# Patient Record
Sex: Male | Born: 1970 | Race: White | Hispanic: No | Marital: Single | Smoking: Never smoker
Health system: Southern US, Community
[De-identification: ages and names within clinical notes are randomized; demographics above are authoritative.]

## PROBLEM LIST (undated history)

## (undated) DIAGNOSIS — I10 Essential (primary) hypertension: Secondary | ICD-10-CM

## (undated) DIAGNOSIS — G629 Polyneuropathy, unspecified: Secondary | ICD-10-CM

## (undated) DIAGNOSIS — E7800 Pure hypercholesterolemia, unspecified: Secondary | ICD-10-CM

## (undated) DIAGNOSIS — K219 Gastro-esophageal reflux disease without esophagitis: Secondary | ICD-10-CM

## (undated) DIAGNOSIS — E119 Type 2 diabetes mellitus without complications: Secondary | ICD-10-CM

## (undated) DIAGNOSIS — L509 Urticaria, unspecified: Secondary | ICD-10-CM

## (undated) DIAGNOSIS — T7840XA Allergy, unspecified, initial encounter: Secondary | ICD-10-CM

## (undated) HISTORY — DX: Polyneuropathy, unspecified: G62.9

## (undated) HISTORY — DX: Type 2 diabetes mellitus without complications: E11.9

## (undated) HISTORY — PX: CATARACT EXTRACTION, BILATERAL: SHX1313

## (undated) HISTORY — DX: Allergy, unspecified, initial encounter: T78.40XA

## (undated) HISTORY — PX: VASECTOMY: SHX75

## (undated) HISTORY — PX: OTHER SURGICAL HISTORY: SHX169

---

## 1998-10-26 ENCOUNTER — Emergency Department (HOSPITAL_COMMUNITY): Admission: EM | Admit: 1998-10-26 | Discharge: 1998-10-26 | Payer: Self-pay | Admitting: Emergency Medicine

## 1998-10-26 ENCOUNTER — Encounter: Payer: Self-pay | Admitting: Emergency Medicine

## 2001-11-14 ENCOUNTER — Emergency Department (HOSPITAL_COMMUNITY): Admission: EM | Admit: 2001-11-14 | Discharge: 2001-11-15 | Payer: Self-pay | Admitting: Emergency Medicine

## 2003-06-20 ENCOUNTER — Emergency Department (HOSPITAL_COMMUNITY): Admission: EM | Admit: 2003-06-20 | Discharge: 2003-06-20 | Payer: Self-pay | Admitting: Emergency Medicine

## 2013-11-11 ENCOUNTER — Encounter (HOSPITAL_COMMUNITY): Payer: Self-pay | Admitting: Emergency Medicine

## 2013-11-11 ENCOUNTER — Emergency Department (HOSPITAL_COMMUNITY)
Admission: EM | Admit: 2013-11-11 | Discharge: 2013-11-11 | Disposition: A | Discharge status: Home / Self Care | Payer: 59 | Attending: Emergency Medicine | Admitting: Emergency Medicine

## 2013-11-11 ENCOUNTER — Emergency Department (HOSPITAL_COMMUNITY): Payer: 59

## 2013-11-11 DIAGNOSIS — R0789 Other chest pain: Secondary | ICD-10-CM | POA: Insufficient documentation

## 2013-11-11 DIAGNOSIS — R0602 Shortness of breath: Secondary | ICD-10-CM | POA: Insufficient documentation

## 2013-11-11 DIAGNOSIS — E7800 Pure hypercholesterolemia, unspecified: Secondary | ICD-10-CM | POA: Insufficient documentation

## 2013-11-11 DIAGNOSIS — R110 Nausea: Secondary | ICD-10-CM | POA: Insufficient documentation

## 2013-11-11 DIAGNOSIS — Z880 Allergy status to penicillin: Secondary | ICD-10-CM | POA: Insufficient documentation

## 2013-11-11 DIAGNOSIS — Z872 Personal history of diseases of the skin and subcutaneous tissue: Secondary | ICD-10-CM | POA: Insufficient documentation

## 2013-11-11 DIAGNOSIS — R42 Dizziness and giddiness: Secondary | ICD-10-CM | POA: Insufficient documentation

## 2013-11-11 DIAGNOSIS — R5383 Other fatigue: Secondary | ICD-10-CM

## 2013-11-11 DIAGNOSIS — Z79899 Other long term (current) drug therapy: Secondary | ICD-10-CM | POA: Insufficient documentation

## 2013-11-11 DIAGNOSIS — I10 Essential (primary) hypertension: Secondary | ICD-10-CM | POA: Insufficient documentation

## 2013-11-11 DIAGNOSIS — R5381 Other malaise: Secondary | ICD-10-CM | POA: Insufficient documentation

## 2013-11-11 DIAGNOSIS — R079 Chest pain, unspecified: Secondary | ICD-10-CM

## 2013-11-11 DIAGNOSIS — E119 Type 2 diabetes mellitus without complications: Secondary | ICD-10-CM | POA: Insufficient documentation

## 2013-11-11 HISTORY — DX: Pure hypercholesterolemia, unspecified: E78.00

## 2013-11-11 HISTORY — DX: Type 2 diabetes mellitus without complications: E11.9

## 2013-11-11 HISTORY — DX: Essential (primary) hypertension: I10

## 2013-11-11 HISTORY — DX: Urticaria, unspecified: L50.9

## 2013-11-11 LAB — CBC
HEMATOCRIT: 42.6 % (ref 39.0–52.0)
Hemoglobin: 14.7 g/dL (ref 13.0–17.0)
MCH: 29.9 pg (ref 26.0–34.0)
MCHC: 34.5 g/dL (ref 30.0–36.0)
MCV: 86.8 fL (ref 78.0–100.0)
Platelets: 193 10*3/uL (ref 150–400)
RBC: 4.91 MIL/uL (ref 4.22–5.81)
RDW: 12.3 % (ref 11.5–15.5)
WBC: 5.8 10*3/uL (ref 4.0–10.5)

## 2013-11-11 LAB — BASIC METABOLIC PANEL
BUN: 10 mg/dL (ref 6–23)
CO2: 22 mEq/L (ref 19–32)
Calcium: 9.2 mg/dL (ref 8.4–10.5)
Chloride: 98 mEq/L (ref 96–112)
Creatinine, Ser: 0.66 mg/dL (ref 0.50–1.35)
GFR calc Af Amer: >90 mL/min (ref >90)
GLUCOSE: 243 mg/dL — AB (ref 70–99)
Potassium: 4.2 mEq/L (ref 3.7–5.3)
Sodium: 134 mEq/L — ABNORMAL LOW (ref 137–147)

## 2013-11-11 LAB — I-STAT TROPONIN, ED
Troponin i, poc: 0 ng/mL (ref 0.00–0.08)
Troponin i, poc: 0 ng/mL (ref 0.00–0.08)

## 2013-11-11 LAB — D-DIMER, QUANTITATIVE: D-Dimer, Quant: <0.27 ug/mL-FEU (ref 0.00–0.48)

## 2013-11-11 NOTE — Discharge Instructions (Signed)
Please follow up with [facility] in the next week for further evaluation of your chest pain.  You may benefit from a stress test and further work up.  Return if your chest pain worsen or if you have other concerns.    Chest Pain Observation It is often hard to give a specific diagnosis for the cause of chest pain. Among other possibilities your symptoms might be caused by inadequate oxygen delivery to your heart (angina). Angina that is not treated or evaluated can lead to a heart attack (myocardial infarction) or death. Blood tests, electrocardiograms, and X-rays may have been done to help determine a possible cause of your chest pain. After evaluation and observation, your health care provider has determined that it is unlikely your pain was caused by an unstable condition that requires hospitalization. However, a full evaluation of your pain may need to be completed, with additional diagnostic testing as directed. It is very important to keep your follow-up appointments. Not keeping your follow-up appointments could result in permanent heart damage, disability, or death. If there is any problem keeping your follow-up appointments, you must call your health care provider. HOME CARE INSTRUCTIONS  Due to the slight chance that your pain could be angina, it is important to follow your health care provider's treatment plan and also maintain a healthy lifestyle:  Maintain or work toward achieving a healthy weight.  Stay physically active and exercise regularly.  Decrease your salt intake.  Eat a balanced, healthy diet. Talk to a dietician to learn about heart healthy foods.  Increase your fiber intake by including whole grains, vegetables, fruits, and nuts in your diet.  Avoid situations that cause stress, anger, or depression.  Take medicines as advised by your health care provider. Report any side effects to your health care provider. Do not stop medicines or adjust the dosages on your  own.  Quit smoking. Do not use nicotine patches or gum until you check with your health care provider.  Keep your blood pressure, blood sugar, and cholesterol levels within normal limits.  Limit alcohol intake to no more than 1 drink per day for women that are not pregnant and 2 drinks per day for men.  Do not abuse drugs. SEEK IMMEDIATE MEDICAL CARE IF: You have severe chest pain or pressure which may include symptoms such as:  You feel pain or pressure in you arms, neck, jaw, or back.  You have severe back or abdominal pain, feel sick to your stomach (nauseous), or throw up (vomit).  You are sweating profusely.  You are having a fast or irregular heartbeat.  You feel short of breath while at rest.  You notice increasing shortness of breath during rest, sleep, or with activity.  You have chest pain that does not get better after rest or after taking your usual medicine.  You wake from sleep with chest pain.  You are unable to sleep because you cannot breathe.  You develop a frequent cough or you are coughing up blood.  You feel dizzy, faint, or experience extreme fatigue.  You develop severe weakness, dizziness, fainting, or chills. Any of these symptoms may represent a serious problem that is an emergency. Do not wait to see if the symptoms will go away. Call your local emergency services (911 in the U.S.). Do not drive yourself to the hospital. MAKE SURE YOU:  Understand these instructions.  Will watch your condition.  Will get help right away if you are not doing well or get  worse. Document Released: 09/22/2010 Document Revised: 04/22/2013 Document Reviewed: 02/19/2013 [facility] Patient Information 2014 [place], [place].   Emergency Department Resource Guide 1) Find a Doctor and Pay Out of Pocket Although you won't have to find out who is covered by your insurance plan, it is a good idea to ask around and get recommendations. You will then need to call the office  and see if the doctor you have chosen will accept you as a new patient and what types of options they offer for patients who are self-pay. Some doctors offer discounts or will set up payment plans for their patients who do not have insurance, but you will need to ask so you aren't surprised when you get to your appointment.  2) Contact Your Local Health Department Not all health departments have doctors that can see patients for sick visits, but many do, so it is worth a call to see if yours does. If you don't know where your local health department is, you can check in your phone book. The CDC also has a tool to help you locate your state's health department, and many state websites also have listings of all of their local health departments.  3) Find a Walk-in Clinic If your illness is not likely to be very severe or complicated, you may want to try a walk in clinic. These are popping up all over the country in pharmacies, drugstores, and shopping centers. They're usually staffed by nurse practitioners or physician assistants that have been trained to treat common illnesses and complaints. They're usually fairly quick and inexpensive. However, if you have serious medical issues or chronic medical problems, these are probably not your best option.  No Primary Care Doctor: - Call Health Connect at  [phone] - they can help you locate a primary care doctor that  accepts your insurance, provides certain services, etc. - Physician Referral Service- [phone]  Chronic Pain Problems: Organization         Address  Phone   Notes  [facility]  [phone] Patients need to be referred by their primary care doctor.   Medication Assistance: Organization         Address  Phone   Notes  [facility] Medication [facility] [address] [place] [place], [place] [address] [phone] --Must be a resident of [facility] -- Must have NO insurance coverage  whatsoever (no Medicaid/ Medicare, etc.) -- The pt. MUST have a primary care doctor that directs their care regularly and follows them in the community   [facility]ist  [phone]   [employer]  [phone]    Agencies that provide inexpensive medical care: Organization         Address  Phone   Notes  [facility]  [phone]   [facility]    [phone]   [facility] [address] [place], [place] [address] [phone]   [facility] [address] [place], [place] [phone]   Planned Parenthood    [phone]   [facility]    [phone]   [facility]  [address], [place] Phone:  [phone], Fax:  [phone] Hours of Operation:  9 am - 6 pm, M-F.  Also accepts Medicaid/Medicare and self-pay.  [facility]  [address], [place] Phone: [phone], Fax: [phone]. Hours of  8:30 am - 5:30 pm, M-F.  Also accepts Medicaid and self-pay.  °[facility] [place] [address], [place] Phone: ([phone]) [phone]   °[facility] [address], [place], [place] [phone], Ext. 123 Mondays & Thursdays: 7-9 AM.  First 15 patients are seen on a first come, first serve basis. °  ° °Medicaid-accepting [place] Providers: ° °Organization         Address  Phone   Notes  °[facility] [address], [place] [phone] Also accepts self-pay patients.  °[facility] [address], [place] ° ([phone]) [phone]   °[facility] [address], [place] [phone]) [phone]   °[facility] [address] [place] [address], [place] [phone]) [phone]   °Veita Bland [address], [place]  ° ([phone]) [phone] Only accepts [place] Access Medicaid patients after they have their name applied  to their card.  ° °Self-Pay (no insurance) in [place]: ° °Organization         Address  Phone   Notes  °Sickle Cell Patients, [facility] [address], [place] [phone]   °[facility] [address], [place] [phone]) [phone]   °[facility] ° [address], [place] [phone]) [phone]   °[facility]/Dr. Osei-Bonsu ° [address] [place] [address], [place] or [address], [place] [phone] Phone number for both [place] and [place] locations is the same.  °[facility] [address], [place] ([phone]) [phone]   °[facility] [address] [place] [address], [place] or [address] ([phone]) [phone] °([phone]) [phone]   °[facility] [address], [place] ([phone]) [phone], phone; ([phone]) [phone], fax Sees patients 1st and 3rd Saturday of every month.  Must not qualify for public or private insurance (i.e. Medicaid, Medicare, [place] Health Choice, Veterans' Benefits) • Household income should be no more than 200% of the poverty level •The clinic cannot treat you if you are pregnant or think you are pregnant • Sexually transmitted diseases are not treated at the clinic.  ° ° °Dental Care: °Organization         Address  Phone  Notes  °[place] [facility] [address], [place] ([phone]) [phone] Accepts children up to age 21 who are enrolled in Medicaid or [place] Health Choice; pregnant women with a Medicaid card; and children who have applied for Medicaid or [place] Health Choice, but were declined, whose parents can pay a reduced fee at time of service.  °[place] [facility] [place]  [address], [place] [phone]) [phone] Accepts children up to age 21 who are enrolled in Medicaid or [place] Health Choice; pregnant women with a Medicaid card; and children who have applied for Medicaid or   Health Choice, but were declined, whose parents can pay a reduced fee at time of service.  °[facility] ° [address], [place] ([phone]) [phone] Patients are seen by appointment only. Walk-ins are not accepted. [facility] will see patients 18 years of age and older. °Monday - Tuesday (8am-5pm) °Most Wednesdays (8:30-5pm) °$30 per visit, cash only  °[facility] ° [address], [place] [phone]) [phone] Patients are seen by appointment   by appointment only. Walk-ins are not accepted. [facility] will see patients 53 years of age and older. One Wednesday Evening (Monthly: Volunteer Based).  $30 per visit, cash only  [employer]  [phone] for adults; Children under age 66, call Graduate Pediatric Dentistry at [phone]. Children aged 49-14, please call [phone] to request a pediatric application.  Dental services are provided in all areas of dental care including fillings, crowns and bridges, complete and partial dentures, implants, gum treatment, root canals, and extractions. Preventive care is also provided. Treatment is provided to both adults and children. Patients are selected via a lottery and there is often a waiting list.   [facility] [address] [place]  [phone] [email or web address]   [facility] [address] [place], [place] [phone], Ext. [phone] Second and Fourth Thursday of each month, opens at 6:30 AM; Clinic ends at 9 AM.  Patients are seen on a first-come first-served basis, and a limited number are seen during each clinic.   [facility]  [address] [place], [place] [phone]   Eligibility Requirements You must have lived in [place], [place], or [place] counties for at least the last three months.   You cannot be eligible for state or federal sponsored [place], including [employer], [place], or [employer].    You generally cannot be eligible for healthcare insurance through your employer.    How to apply: Eligibility screenings are held every Tuesday and Wednesday afternoon from 1:00 pm until 4:00 pm. You do not need an appointment for the interview!  [facility] [address], [place], [place] [phone]   [facility] Health Department  [phone]   [facility] Health Department  [phone]   [facility] Health Department  [phone]    Behavioral Health Resources in the Community: Intensive Outpatient Programs Organization         Address  Phone  Notes  [facility] [address], [place], [place] [phone]   [facility] Outpatient [address] [place], [place] [phone]   [facility] [address] [place], [place]  [phone]   [facility] [address]  [place], [place] [phone] or [phone]   Substance Abuse Resources Organization         Address  Phone  Notes  [facility]  [phone]   [facility]  [phone]   [facility]  [phone]   Floydene Flock  [phone]   Residential & Outpatient Substance Abuse Program  [phone]   Psychological Services Organization         Address  Phone  Notes  [facility] Behavioral Health  [phone]   [facility] Services  [phone]   [facility] [address], [place] [phone] or [phone]    Mobile Crisis Teams Organization         Address  Phone  Notes  [facility], Mobile Crisis Care Unit  7264258222   [facility]  [address] [place], [place] [phone]   Doristine Locks [address] [place] [phone]    Self-Help/Support Groups Organization         Address  Phone             Notes  [facility] [place] - variety of support groups  336- I7437963 Call  for more  information  Narcotics Anonymous (NA), [facility] [address], [employer] [place]  2 meetings at this location   Residential Sports administrator         Address  Phone  Notes  [facility] [address],    [place]  [phone]   [facility]  [address], [place] [address], [place], [place] [phone]   [facility] [address] [place], [place] [phone] Admissions: 8am-3pm M-F  [facility] [address],    [place], [place] [phone]   [facility] [address] [place], [place], [place] [phone]   [facility] [address]  [place], [place] [phone]   [facility] - Intensive Outpatient [address], [place], [place] [phone]   [facility] ([facility] [address]  [place], [place] [phone] or [phone]   [facility] ([facility]) [address], [place], [place] [phone] Accepts Medicaid  [facility] [place] [address]  [place] [phone] Substance Abuse/Addiction Treatment   [facility] Organization         Address  Phone  Notes  [facility]  [phone]   Angie Fava, PhD [address] [place], [place]   [phone] or [phone]   [facility]al   [address] [place], [place] [phone]   [facility] [address], [place], [place] [phone] Insurance/Medicaid/sponsorship through [facility] and Families [address]                                    [place], [place] [phone] Therapy/tele-psych/case  [facility] [address] [place], [place] [phone]    Dr. Lolly Mustache  [phone]   [facility]  [employer] [facility] Dept. 1) [address], [place] 2) [address] [place] 3)  [address], [place]  [phone]  [phone]   [facility] Child Abuse Hotline 717-566-3315 or [phone] (After Hours)

## 2013-11-11 NOTE — ED Provider Notes (Signed)
Medical screening examination/treatment/procedure(s) were conducted as a shared visit with non-physician practitioner(s) and myself.  I personally evaluated the patient during the encounter.   EKG Interpretation   Date/Time:  Wednesday November 11 2013 12:36:02 EDT Ventricular Rate:  78 PR Interval:  189 QRS Duration: 91 QT Interval:  359 QTC Calculation: 409 R Axis:   -6 Text Interpretation:  Sinus rhythm Anterior infarct, old Baseline wander  in lead(s) V1 No old tracing to compare Confirmed by [facility]  MD, TREY  ([id number]) on 11/11/2013 3:13:35 PM      43 yo male with chest pain.  Somewhat atypical story.  None since 9:30am today.  On exam, well appearing, nontoxic, not diaphoretic, lungs clear to auscultation bilaterally, heart sounds normal, regular rate and rhythm. Initial workup reassuring. Plan delta troponin and close cardiology followup if negative.  Clinical Impression: 1. Chest pain       Candyce ChurnJohn David Aily Tzeng III, MD 11/11/13 [phone]

## 2013-11-11 NOTE — ED Provider Notes (Signed)
CSN: [id number]     Arrival date & time 11/11/13  1230 History   First MD Initiated Contact with Patient 11/11/13 1258     Chief Complaint  Patient presents with  . Chest Pain  . Nausea  . Fatigue     (Consider location/radiation/quality/duration/timing/severity/associated sxs/prior Treatment) HPI  43 year old male with history of non-insulin-dependent diabetes, hypertension, high cholesterol who [facility] of [facility] pain. Patient has been complaining of intermittent chest pain for the past 4 days. Pain is associated with dizziness, shortness of breath, lightheadedness, nausea and weakness. Pain is described as a sharp and pressure sensation to his left upper chest, nonradiating, lasting for about 20-30 minutes. Denies any precipitating symptoms. This morning after getting out of the shower he experienced the same chest discomfort which prompted her to go to urgent care for further evaluation. He did receive aspirin at the urgent care. He denies any active chest pain or shortness of breath at this time. He denies fever, chills, productive cough, hemoptysis, dyspnea on exertion, back or abdominal pain, or numbness. No prior cardiac provocative testing. Patient is a nonsmoker. Patient report recent [place] ([place] hrs trip).  This was 5 days ago prior to the onset of his symptoms. However he denies any prior history of PE or DVT, no recent surgery, prolonged bed rest,   Past Medical History  Diagnosis Date  . Diabetes mellitus without complication   . Hypertension   . Hives of unknown origin   . High cholesterol    History reviewed. No pertinent past surgical history. No family history on file. History  Substance Use Topics  . Smoking status: Never Smoker   . Smokeless tobacco: Not on file  . Alcohol Use: No    Review of Systems  All other systems reviewed and are negative.      Allergies  Penicillins  Home Medications   Current  Outpatient Rx  Name  Route  Sig  Dispense  Refill  . atorvastatin (LIPITOR) 20 MG tablet   Oral   Take 20 mg by mouth daily.         Mar[place] Kitchen. glimepiride (AMARYL) 2 MG tablet   Oral   Take 2 mg by mouth daily with breakfast.         . lisinopril (PRINIVIL,ZESTRIL) 5 MG tablet   Oral   Take 5 mg by mouth daily.          BP 147/101  Pulse 79  Temp(Src) 98.7 F (37.1 C) (Oral)  Resp 14  SpO2 93% Physical Exam  Nursing note and vitals reviewed. Constitutional: He is oriented to person, place, and time. He appears well-developed and well-nourished. No distress.  Awake, alert, nontoxic appearance  HENT:  Head: Atraumatic.  Eyes: Conjunctivae are normal. Right eye exhibits no discharge. Left eye exhibits no discharge.  Neck: Normal range of motion. Neck supple. No JVD present.  Cardiovascular: Normal rate, regular rhythm and intact distal pulses.   Pulmonary/Chest: Effort normal. No respiratory distress. He exhibits no tenderness.  Abdominal: Soft. There is no tenderness. There is no rebound.  Protuberant abdomen  Musculoskeletal: He exhibits no edema and no tenderness.  ROM appears intact, no obvious focal weakness  Bilateral lower extremities without palpable cords, erythema, edema, negative Homans sign.  Neurological: He is alert and oriented to person, place, and time.  Skin: Skin is warm and dry. No rash noted.  Psychiatric: He has a normal mood and affect.  ED Course  Procedures (including critical care time)  Patient with moderate risk factors for cardiac disease presents complaining of chest pain. He is currently chest pain-free. EKG without ischemic changes, normal eyes troponin, his labs were reassuring, chest x-ray without any acute finding. Since patient report recent long travel, but no unilateral leg swelling or calf pain, will obtain a d-dimer.  Care discussed with Dr. Loretha Stapler  HEART score of 3.  Will perform delta troponin and d/c with close f/u for further  work up.   5:18 PM Care discussed with oncoming provider who will d/c pt once delta troponin resulted.  Pt will need to f/u with cardiology for further evaluation.    Labs Review Labs Reviewed  BASIC METABOLIC PANEL - Abnormal; Notable for the following:    Sodium 134 (*)    Glucose, Bld 243 (*)    All other components within normal limits  CBC  D-DIMER, QUANTITATIVE  I-STAT TROPOININ, ED  Rosezena Sensor, ED   Imaging Review Dg Chest 2 View  11/11/2013   CLINICAL DATA Shortness of breath and chest pain  EXAM CHEST  2 VIEW  COMPARISON None.  FINDINGS There is mild scarring in the left lower lobe region. Lungs elsewhere are clear. Heart size and pulmonary vascularity are normal. No adenopathy. There is degenerative change in the thoracic spine.  IMPRESSION No edema or consolidation.  Mild scarring left base.  SIGNATURE  Electronically Signed   By: Bretta Bang M.D.   On: 11/11/2013 13:46     EKG Interpretation   Date/Time:  Wednesday November 11 2013 12:36:02 EDT Ventricular Rate:  78 PR Interval:  189 QRS Duration: 91 QT Interval:  359 QTC Calculation: 409 R Axis:   -6 Text Interpretation:  Sinus rhythm Anterior infarct, old Baseline wander  in lead(s) V1 No old tracing to compare Confirmed by [facility]  MD, TREY  ([id number]) on 11/11/2013 3:13:35 PM      Date: 11/11/2013  Rate: 78  Rhythm: normal sinus rhythm  QRS Axis: indeterminate  Intervals: normal  ST/T Wave abnormalities: nonspecific T wave changes  Conduction Disutrbances:none  Narrative Interpretation:   Old EKG Reviewed: none available    MDM   Final diagnoses:  Chest pain    BP 125/77  Pulse 84  Temp(Src) 98.7 F (37.1 C) (Oral)  Resp 6  SpO2 96%  I have reviewed nursing notes and vital signs. I personally reviewed the imaging tests through PACS system  I reviewed available ER/hospitalization records thought the EMR     Fayrene Helper, [place] 11/11/13 1719

## 2013-11-11 NOTE — ED Notes (Signed)
Pt with CP intermittent x 4 days associated with SOB, dizziness, lightheadedness, nausea, and weakness.  Pt went to his PCP today and was sent here for further eval.  Pt with abnormal EKG from PCP (EKG at bedside).  Pt currently denies pain, but states he feels weak.

## 2013-11-12 NOTE — ED Provider Notes (Signed)
Medical screening examination/treatment/procedure(s) were conducted as a shared visit with non-physician practitioner(s) and myself.  I personally evaluated the patient during the encounter.   EKG Interpretation   Date/Time:  Wednesday November 11 2013 12:36:02 EDT Ventricular Rate:  78 PR Interval:  189 QRS Duration: 91 QT Interval:  359 QTC Calculation: 409 R Axis:   -6 Text Interpretation:  Sinus rhythm Anterior infarct, old Baseline wander  in lead(s) V1 No old tracing to compare Confirmed by [facility]  MD, TREY  [phone]) on 11/11/2013 3:13:35 PM        Dustin RuizJohn Young Berryavid Shalik Sanfilippo III, MD 11/12/13 [phone]

## 2013-12-01 ENCOUNTER — Encounter: Payer: Self-pay | Admitting: Cardiovascular Disease

## 2013-12-01 ENCOUNTER — Ambulatory Visit (INDEPENDENT_AMBULATORY_CARE_PROVIDER_SITE_OTHER): Payer: 59 | Admitting: Cardiovascular Disease

## 2013-12-01 VITALS — BP 140/96 | HR 103 | Ht 72.0 in | Wt 276.0 lb

## 2013-12-01 DIAGNOSIS — R079 Chest pain, unspecified: Secondary | ICD-10-CM

## 2013-12-01 DIAGNOSIS — I10 Essential (primary) hypertension: Secondary | ICD-10-CM | POA: Insufficient documentation

## 2013-12-01 DIAGNOSIS — I208 Other forms of angina pectoris: Secondary | ICD-10-CM | POA: Insufficient documentation

## 2013-12-01 DIAGNOSIS — E119 Type 2 diabetes mellitus without complications: Secondary | ICD-10-CM

## 2013-12-01 DIAGNOSIS — E785 Hyperlipidemia, unspecified: Secondary | ICD-10-CM | POA: Insufficient documentation

## 2013-12-01 NOTE — Patient Instructions (Signed)
Your physician has requested that you have en exercise stress myoview. For further information please visit [email or web address]. Please follow instruction sheet, as given.  Your physician recommends that you continue on your current medications as directed. Please refer to the Current Medication list given to you today.  Your physician recommends that you schedule a follow-up appointment as needed with Dr. Excell Seltzerooper.

## 2013-12-01 NOTE — Progress Notes (Signed)
HPI:  43 year old gentleman presenting for initial cardiac evaluation. About 3 weeks ago the patient went to a bachelor party out of town. He drank more alcohol than normal. He did a lot of walking during that weekend. Upon his return he did not feel well on Sunday. The following day he experienced chest pain in the center of his chest. Describes this as a sharp pain. It was intermittent throughout the day. The pain recurred and he went to a walk-in medical clinic and then to the emergency department at their suggestion. He had serial troponins which were negative. Other blood work was unremarkable and his EKG showed no acute injury. However, there was an age indeterminate anterior infarct pattern on his ECG and cardiac followup was recommended.  The patient has not had further symptoms since that 4-5 day period. He states at that time he just "did not feel right." There was associated shortness of breath and lightheadedness. He has not engaged in regular exercise. He denies orthopnea, PND, or leg swelling. He has no personal history of cardiac disease.  The patient has had diabetes, hypertension, and hyperlipidemia, all diagnosed approximately 7 years ago. His hypertension and diabetes were not controlled until the last 1-2 years.  He's lost about 40 pounds over the past few years. He has no past surgical history. He has not been hospitalized in the past. There is no family history of coronary artery disease. He does have a strong family history of diabetes.  Outpatient Encounter Prescriptions as of 12/01/2013  Medication Sig  . atorvastatin (LIPITOR) 20 MG tablet Take 20 mg by mouth daily.  Marland Kitchen. glimepiride (AMARYL) 2 MG tablet Take 2 mg by mouth daily with breakfast.  . lisinopril (PRINIVIL,ZESTRIL) 5 MG tablet Take 5 mg by mouth daily.    Penicillins  Past Medical History  Diagnosis Date  . Diabetes mellitus without complication   . Hypertension   . Hives of unknown origin   . High  cholesterol     No past surgical history on file.  History   Social History  . Marital Status: Married    Spouse Name: N/A    Number of Children: N/A  . Years of Education: N/A   Occupational History  . Not on file.   Social History Main Topics  . Smoking status: Never Smoker   . Smokeless tobacco: Not on file  . Alcohol Use: No  . Drug Use: No  . Sexual Activity: Not on file   Other Topics Concern  . Not on file   Social History Narrative  . No narrative on file    Family history: Negative for CAD.  ROS:  General: no fevers/chills/night sweats Eyes: no blurry vision, diplopia, or amaurosis ENT: no sore throat or hearing loss Resp: no cough, wheezing, or hemoptysis CV: no edema or palpitations GI: no abdominal pain, nausea, vomiting, diarrhea, or constipation GU: no dysuria, frequency, or hematuria Skin: no rash Neuro: no headache, numbness, tingling, or weakness of extremities Musculoskeletal: Positive for bilateral knee pain Heme: no bleeding, DVT, or easy bruising Endo: no polydipsia or polyuria  BP 140/96  Pulse 103  Ht 6' (1.829 m)  Wt 276 lb (125.193 kg)  BMI 37.42 kg/m2  PHYSICAL EXAM: Pt is alert and oriented, WD, WN, obese male in no distress. HEENT: normal Neck: JVP normal. Carotid upstrokes normal without bruits. No thyromegaly. Lungs: equal expansion, clear bilaterally CV: Apex is discrete and nondisplaced, RRR without murmur or gallop Abd: soft, NT, +BS, no  bruit, obese Back: no CVA tenderness Ext: no C/C/E        DP/PT pulses intact and = Skin: warm and dry without rash Neuro: CNII-XII intact             Strength intact = bilaterally  EKG:  Sinus tachycardia with age indeterminate anteroseptal MI, heart rate 103 beats per minute, otherwise within normal limits.  ASSESSMENT AND PLAN: 1. Chest pain with primarily atypical features 2. Abnormal EKG suggestive of an age-indeterminate anterior MI 3. Type 2 diabetes 4. Essential  hypertension 5. Hypercholesterolemia  I think an exercise Myoview stress scan is indicated to evaluate the patient's abnormal EKG and chest pain in the setting of multiple cardiovascular risk factors which include diabetes, hypertension, and hypercholesterolemia. If his stress test is low risk, I do not think any further cardiac testing would be indicated. I will see him back as needed unless there are abnormalities identified on his stress test. I did review all of his testing from his emergency department evaluation including blood work, EKGs, and chest x-ray. All of this was unrevealing. We had a lengthy discussion about the importance of lifestyle modification with focus on weight loss, control of diabetes, and routine exercise in order to reduce the long-term complications related to his medical problems.  Dustin Baker 12/01/2013 5:02 PM

## 2013-12-23 ENCOUNTER — Ambulatory Visit (HOSPITAL_COMMUNITY): Payer: 59 | Attending: Cardiovascular Disease | Admitting: Radiology

## 2013-12-23 ENCOUNTER — Encounter: Payer: Self-pay | Admitting: Cardiovascular Disease

## 2013-12-23 VITALS — BP 111/83 | Ht 72.0 in | Wt 275.0 lb

## 2013-12-23 DIAGNOSIS — R079 Chest pain, unspecified: Secondary | ICD-10-CM | POA: Insufficient documentation

## 2013-12-23 MED ORDER — TECHNETIUM TC 99M SESTAMIBI GENERIC - CARDIOLITE
30.0000 | Freq: Once | INTRAVENOUS | Status: AC | PRN
  Administered 2013-12-23: 30 via INTRAVENOUS

## 2013-12-23 MED ORDER — TECHNETIUM TC 99M SESTAMIBI GENERIC - CARDIOLITE
10.0000 | Freq: Once | INTRAVENOUS | Status: AC | PRN
  Administered 2013-12-23: 10 via INTRAVENOUS

## 2013-12-23 NOTE — Progress Notes (Signed)
[facility] SITE 3 NUCLEAR MED [address] [place], [place] [address] [phone]    Cardiology Nuclear Med Study  Dustin Baker is a 43 y.o. male     MRN : [id number]     DOB: 04-20-1971  Procedure Date: 12/23/2013  Nuclear Med Background Indication for Stress Test:  Evaluation for Ischemia, Post Hospital- 3/15 CP, Negative enzymes and Abnormal EKG History:  No Cardiac History Cardiac Risk Factors: Hypertension, Lipids and NIDDM  Symptoms:  Chest Pain   Nuclear Pre-Procedure Caffeine/Decaff Intake:  None NPO After: 8:00pm   Lungs:  clear O2 Sat: 96% on room air. IV 0.9% NS with Angio Cath:  22g  IV Site: R Hand  IV Started by:  Bonnita LevanJackie Smith, RN  Chest Size (in):  50 Cup Size: n/a  Height: 6' (1.829 m)  Weight:  275 lb (124.739 kg)  BMI:  Body mass index is 37.29 kg/(m^2). Tech Comments:  N/A    Nuclear Med Study 1 or 2 day study: 1 day  Stress Test Type:  Stress  Reading MD: N/A  Order Authorizing Provider:  Tonny BollmanMichael Cooper, MD  Resting Radionuclide: Technetium 8342m Sestamibi  Resting Radionuclide Dose: 11.0 mCi   Stress Radionuclide:  Technetium 6242m Sestamibi  Stress Radionuclide Dose: 33.0 mCi           Stress Protocol Rest HR: 70 Stress HR: 157  Rest BP: 111/83 Stress BP: 140/91  Exercise Time (min): 8:00 METS: 10.10   Predicted Max HR: 179 bpm % Max HR: 87.71 bpm Rate Pressure Product: [id number]   Dose of Adenosine (mg):  n/a Dose of Lexiscan: n/a mg  Dose of Atropine (mg): n/a Dose of Dobutamine: n/a mcg/kg/min (at max HR)  Stress Test Technologist: Milana NaSabrina Williams, EMT-P  Nuclear Technologist:  Domenic PoliteStephen Carbone, CNMT     Rest Procedure:  Myocardial perfusion imaging was performed at rest 45 minutes following the intravenous administration of Technetium 6842m Sestamibi. Rest ECG: Normal sinus rhythm. Decreased anterior R wave progression.  Stress Procedure:  The patient exercised on the treadmill utilizing the Bruce Protocol for 8:00 minutes. The  patient stopped due to sob, fatigue, and chest tightness.  Technetium 5842m Sestamibi was injected at peak exercise and myocardial perfusion imaging was performed after a brief delay. Stress ECG: No significant change from baseline ECG  QPS Raw Data Images:  Normal; no motion artifact; normal heart/lung ratio. Stress Images:  There is a small area of mild decreased activity at the apical anterior segment and apical septal segment. Rest Images:  Very small area of mild decreased activity at the apical anterior segment. Subtraction (SDS):  There is no definite ischemia. Transient Ischemic Dilatation (Normal <1.22):  0.93 Lung/Heart Ratio (Normal <0.45):  0.39  Quantitative Gated Spect Images QGS EDV:  100 ml QGS ESV:  39 ml  Impression Exercise Capacity:  Exercise capacity is fair for this 43 year old patient. BP Response:  Normal blood pressure response. Clinical Symptoms:  The patient has shortness of breath and 5/10 chest tightness. ECG Impression:  No significant ST segment change suggestive of ischemia. Comparison with Prior Nuclear Study: No images to compare  Overall Impression:  There are very slight areas of decreased activity new the apex. I suspect that this is related to soft tissue attenuation. I am not convinced that this represents scar or ischemia. It is noted that the patient had 5/10 chest pain. However EKG revealed no change and the nuclear study does not reveal any significant abnormalities. This is a  low risk scan.  LV Ejection Fraction: 61%.  LV Wall Motion:  Normal Wall Motion.  Dustin Baker

## 2013-12-25 ENCOUNTER — Encounter: Payer: Self-pay | Admitting: Cardiovascular Disease

## 2013-12-25 NOTE — Telephone Encounter (Signed)
This encounter was created in error - please disregard.

## 2013-12-25 NOTE — Telephone Encounter (Signed)
New problem   Pt want to know results of his stress test that was done 12/23/13. Please call pt.

## 2015-05-03 ENCOUNTER — Encounter: Payer: BLUE CROSS/BLUE SHIELD | Attending: Family Medicine | Admitting: *Deleted

## 2015-05-03 ENCOUNTER — Encounter: Payer: Self-pay | Admitting: *Deleted

## 2015-05-03 VITALS — Ht 72.0 in | Wt 272.4 lb

## 2015-05-03 DIAGNOSIS — Z713 Dietary counseling and surveillance: Secondary | ICD-10-CM | POA: Insufficient documentation

## 2015-05-03 DIAGNOSIS — E119 Type 2 diabetes mellitus without complications: Secondary | ICD-10-CM

## 2015-05-03 NOTE — Patient Instructions (Addendum)
  Plan:  Aim for 3 Carb Choices per meal (45 grams) +/- 1 either way  Aim for 0-15 Carbs per snack if hungry  Include protein in moderation with your meals and snacks Consider reading food labels for Total Carbohydrate and Fat Grams of foods Consider  increasing your activity level by walking for 30 minutes daily as tolerated Consider checking BG at alternate times per day to include fasting, before meals and 2 hrs after a mealas directed by MD  Continue taking medication as directed by MD  .Blood Glucose Monitoring Instruction  Meter Provided:  OneTouch Verio Flex Lot #: [id number] Expiration Date:07/2016 Glucose Reading: /dl   Intervention:    Explained rationale of testing BG to obtain data as to how their diabetes is being managed.  Provided Target Ranges for both pre and post meals  Explained factors that effect BG including food (carbohydrate), stress, activity level and insulin availability in the body including diabetes medications  Taught patient techniques for using BG monitor and lancing device  Discussed need for Rx for strips and lancets   Explained rationale of recording BG both for patient and MD to assess patterns as needed.

## 2015-05-04 NOTE — Progress Notes (Deleted)
Diabetes Self-Management Education  Visit Type: First/Initial  Appt. Start Time: 0800 Appt. End Time: 0930  05/10/2015  Mr. Dustin Baker, identified by name and date of birth, is a 44 y.o. male with a diagnosis of Diabetes: Type 2.  Patient has not been making any effort to manage glucose, nor is he testing. Patient recently decided to terminate marriage and is highly stressed.   ASSESSMENT  Height 6' (1.829 m), weight 272 lb 6.4 oz (123.56 kg). Body mass index is 36.94 kg/(m^2).    Individualized Plan for Diabetes Self-Management Training:   Learning Objective:  Patient will have a greater understanding of diabetes self-management. Patient education plan is to attend individual and/or group sessions per assessed needs and concerns.   Plan:   Patient Instructions  Plan:  Aim for 3 Carb Choices per meal (45 grams) +/- 1 either way  Aim for 0-15 Carbs per snack if hungry  Include protein in moderation with your meals and snacks Consider reading food labels for Total Carbohydrate and Fat Grams of foods Consider  increasing your activity level by walking for 30 minutes daily as tolerated Consider checking BG at alternate times per day to include fasting, before meals and 2 hrs after a mealas directed by MD  Continue taking medication as directed by MD   Expected Outcomes:  Demonstrated interest in learning. Expect positive outcomes  Education material provided: Living Well with Diabetes, A1C conversion sheet, Meal plan card, My Plate and Snack sheet  If problems or questions, patient to contact team via:  Phone  Future DSME appointment: PRN

## 2015-05-10 NOTE — Progress Notes (Deleted)
Erroneous note

## 2015-05-10 NOTE — Progress Notes (Signed)
Diabetes Self-Management Education  Visit Type: First/Initial  Appt. Start Time: 0800 Appt. End Time: 0930  05/10/2015  Dustin Baker, identified by name and date of birth, is a 44 y.o. male with a diagnosis of Diabetes: Type 2. Dustin Baker has a recent A1c of 11.5% He is not testing his glucose nor monitoring his dietary intake.  ASSESSMENT  Height 6' (1.829 m), weight 272 lb 6.4 oz (123.56 kg). Body mass index is 36.94 kg/(m^2).      Diabetes Self-Management Education - 05/03/15 1610    Visit Information   Visit Type First/Initial   Initial Visit   Diabetes Type Type 2   Are you currently following a meal plan? No   Are you taking your medications as prescribed? Yes   Date Diagnosed 2011   Health Coping   How would you rate your overall health? Fair   Psychosocial Assessment   Patient Belief/Attitude about Diabetes Motivated to manage diabetes   Self-care barriers None   Self-management support Doctor's office;CDE visits   Other persons present Patient   Patient Concerns Nutrition/Meal planning;Medication;Monitoring;Weight Control   Special Needs None   Preferred Learning Style No preference indicated   Learning Readiness Change in progress   How often do you need to have someone help you when you read instructions, pamphlets, or other written materials from your doctor or pharmacy? 1 - Never   Complications   Last HgB A1C per patient/outside source 11.5 %   How often do you check your blood sugar? 0 times/day (not testing)   Have you had a dilated eye exam in the past 12 months? Yes   Have you had a dental exam in the past 12 months? Yes   Are you checking your feet? No   Dietary Intake   Breakfast peanut butter & crackers, water   Snack (morning) none   Lunch eat out alot: chinese / pasta, meat, vegetables, water   Snack (afternoon) none   Dinner eat out alot: [employer], [place], [employer]   Snack (evening) none   Beverage(s) water, beer   Exercise   Exercise  Type ADL's;Other (comment)  Patient notes that exercise/walking are a challenge due to back pain   How many days per week to you exercise? 0   How many minutes per day do you exercise? 0   Total minutes per week of exercise 0   Patient Education   Previous Diabetes Education No   Disease state  Definition of diabetes, type 1 and 2, and the diagnosis of diabetes;Factors that contribute to the development of diabetes;Explored patient's options for treatment of their diabetes   Nutrition management  Role of diet in the treatment of diabetes and the relationship between the three main macronutrients and blood glucose level;Food label reading, portion sizes and measuring food.;Carbohydrate counting;Reviewed blood glucose goals for pre and post meals and how to evaluate the patients' food intake on their blood glucose level.;Information on hints to eating out and maintain blood glucose control.;Meal options for control of blood glucose level and chronic complications.   Physical activity and exercise  Role of exercise on diabetes management, blood pressure control and cardiac health.   Monitoring Purpose and frequency of SMBG.;Identified appropriate SMBG and/or A1C goals.;Yearly dilated eye exam;Daily foot exams   Acute complications Taught treatment of hypoglycemia - the 15 rule.   Chronic complications Relationship between chronic complications and blood glucose control;Assessed and discussed foot care and prevention of foot problems;Dental care;Identified and discussed with patient  current chronic complications   Psychosocial adjustment Role of stress on diabetes  patient recently decided to end marriage. Highly stressed   Individualized Goals (developed by patient)   Nutrition General guidelines for healthy choices and portions discussed   Physical Activity Exercise 3-5 times per week;30 minutes per day   Monitoring  test blood glucose pre and post meals as discussed   Reducing Risk do foot checks  daily;increase portions of olive oil in diet   Outcomes   Expected Outcomes Demonstrated interest in learning. Expect positive outcomes   Future DMSE PRN   Program Status Completed      Individualized Plan for Diabetes Self-Management Training:   Learning Objective:  Patient will have a greater understanding of diabetes self-management. Patient education plan is to attend individual and/or group sessions per assessed needs and concerns.   Plan:   Patient Instructions   Plan:  Aim for 3 Carb Choices per meal (45 grams) +/- 1 either way  Aim for 0-15 Carbs per snack if hungry  Include protein in moderation with your meals and snacks Consider reading food labels for Total Carbohydrate and Fat Grams of foods Consider  increasing your activity level by walking for 30 minutes daily as tolerated Consider checking BG at alternate times per day to include fasting, before meals and 2 hrs after a mealas directed by MD  Continue taking medication as directed by MD  .Blood Glucose Monitoring Instruction  Meter Provided:  OneTouch Verio Flex Lot #: [id number] Expiration Date:07/2016 Glucose Reading: /dl   Intervention:    Explained rationale of testing BG to obtain data as to how their diabetes is being managed.  Provided Target Ranges for both pre and post meals  Explained factors that effect BG including food (carbohydrate), stress, activity level and insulin availability in the body including diabetes medications  Taught patient techniques for using BG monitor and lancing device  Discussed need for Rx for strips and lancets   Explained rationale of recording BG both for patient and MD to assess patterns as needed.   Expected Outcomes:  Demonstrated interest in learning. Expect positive outcomes  Education material provided: Living Well with Diabetes, A1C conversion sheet, Meal plan card, My Plate and Snack sheet  If problems or questions, patient to contact team via:   Phone  Future DSME appointment: PRN

## 2016-02-13 ENCOUNTER — Ambulatory Visit (INDEPENDENT_AMBULATORY_CARE_PROVIDER_SITE_OTHER): Payer: 59 | Admitting: Emergency Medicine

## 2016-02-13 ENCOUNTER — Encounter (HOSPITAL_COMMUNITY): Payer: Self-pay

## 2016-02-13 ENCOUNTER — Ambulatory Visit (HOSPITAL_COMMUNITY)
Admission: RE | Admit: 2016-02-13 | Discharge: 2016-02-13 | Disposition: A | Discharge status: Home / Self Care | Payer: 59 | Source: Ambulatory Visit | Attending: Emergency Medicine | Admitting: Emergency Medicine

## 2016-02-13 ENCOUNTER — Telehealth: Payer: Self-pay | Admitting: Emergency Medicine

## 2016-02-13 ENCOUNTER — Telehealth: Payer: Self-pay

## 2016-02-13 ENCOUNTER — Ambulatory Visit (INDEPENDENT_AMBULATORY_CARE_PROVIDER_SITE_OTHER): Payer: 59

## 2016-02-13 ENCOUNTER — Encounter (HOSPITAL_COMMUNITY): Payer: Self-pay | Admitting: Emergency Medicine

## 2016-02-13 ENCOUNTER — Emergency Department (HOSPITAL_COMMUNITY)
Admission: EM | Admit: 2016-02-13 | Discharge: 2016-02-13 | Disposition: A | Discharge status: Home / Self Care | Payer: 59 | Attending: Emergency Medicine | Admitting: Emergency Medicine

## 2016-02-13 VITALS — BP 126/80 | HR 108 | Temp 98.8°F | Resp 17 | Ht 70.5 in | Wt 268.0 lb

## 2016-02-13 DIAGNOSIS — R197 Diarrhea, unspecified: Secondary | ICD-10-CM

## 2016-02-13 DIAGNOSIS — I10 Essential (primary) hypertension: Secondary | ICD-10-CM | POA: Insufficient documentation

## 2016-02-13 DIAGNOSIS — E119 Type 2 diabetes mellitus without complications: Secondary | ICD-10-CM | POA: Insufficient documentation

## 2016-02-13 DIAGNOSIS — R112 Nausea with vomiting, unspecified: Secondary | ICD-10-CM | POA: Diagnosis not present

## 2016-02-13 DIAGNOSIS — K529 Noninfective gastroenteritis and colitis, unspecified: Secondary | ICD-10-CM | POA: Diagnosis not present

## 2016-02-13 DIAGNOSIS — R1084 Generalized abdominal pain: Secondary | ICD-10-CM

## 2016-02-13 DIAGNOSIS — R1032 Left lower quadrant pain: Secondary | ICD-10-CM

## 2016-02-13 DIAGNOSIS — A09 Infectious gastroenteritis and colitis, unspecified: Secondary | ICD-10-CM

## 2016-02-13 DIAGNOSIS — Z79899 Other long term (current) drug therapy: Secondary | ICD-10-CM | POA: Insufficient documentation

## 2016-02-13 DIAGNOSIS — R935 Abnormal findings on diagnostic imaging of other abdominal regions, including retroperitoneum: Secondary | ICD-10-CM

## 2016-02-13 DIAGNOSIS — R109 Unspecified abdominal pain: Secondary | ICD-10-CM | POA: Diagnosis present

## 2016-02-13 DIAGNOSIS — R739 Hyperglycemia, unspecified: Secondary | ICD-10-CM

## 2016-02-13 LAB — POCT CBC
GRANULOCYTE PERCENT: 71.1 % (ref 37–80)
HCT, POC: 45.4 % (ref 43.5–53.7)
Hemoglobin: 15.7 g/dL (ref 14.1–18.1)
Lymph, poc: 2 (ref 0.6–3.4)
MCH, POC: 30.2 pg (ref 27–31.2)
MCHC: 34.5 g/dL (ref 31.8–35.4)
MCV: 87.6 fL (ref 80–97)
MID (CBC): 0.3 (ref 0–0.9)
MPV: 8.2 fL (ref 0–99.8)
PLATELET COUNT, POC: 183 10*3/uL (ref 142–424)
POC Granulocyte: 5.7 (ref 2–6.9)
POC LYMPH PERCENT: 25.3 %L (ref 10–50)
POC MID %: 3.6 %M (ref 0–12)
RBC: 5.18 M/uL (ref 4.69–6.13)
RDW, POC: 13.4 %
WBC: 8 10*3/uL (ref 4.6–10.2)

## 2016-02-13 LAB — POC MICROSCOPIC URINALYSIS (UMFC): Mucus: ABSENT

## 2016-02-13 LAB — POCT URINALYSIS DIP (MANUAL ENTRY)
BILIRUBIN UA: NEGATIVE
BILIRUBIN UA: NEGATIVE
Glucose, UA: 500 — AB
Leukocytes, UA: NEGATIVE
Nitrite, UA: NEGATIVE
Protein Ur, POC: NEGATIVE
Spec Grav, UA: 1.015
Urobilinogen, UA: 0.2
pH, UA: 5.5

## 2016-02-13 LAB — COMPLETE METABOLIC PANEL WITH GFR
ALT: 33 U/L (ref 9–46)
AST: 23 U/L (ref 10–40)
Albumin: 4.5 g/dL (ref 3.6–5.1)
Alkaline Phosphatase: 39 U/L — ABNORMAL LOW (ref 40–115)
BUN: 11 mg/dL (ref 7–25)
CO2: 24 mmol/L (ref 20–31)
CREATININE: 0.78 mg/dL (ref 0.60–1.35)
Calcium: 9.8 mg/dL (ref 8.6–10.3)
Chloride: 104 mmol/L (ref 98–110)
GFR, Est Non African American: >89 mL/min (ref >=60)
Glucose, Bld: 238 mg/dL — ABNORMAL HIGH (ref 65–99)
Potassium: 4.7 mmol/L (ref 3.5–5.3)
Sodium: 139 mmol/L (ref 135–146)
Total Bilirubin: 0.4 mg/dL (ref 0.2–1.2)
Total Protein: 7.3 g/dL (ref 6.1–8.1)

## 2016-02-13 LAB — LIPASE: Lipase: 18 U/L (ref 7–60)

## 2016-02-13 LAB — GLUCOSE, POCT (MANUAL RESULT ENTRY): POC Glucose: 246 mg/dl — AB (ref 70–99)

## 2016-02-13 MED ORDER — IOPAMIDOL (ISOVUE-300) INJECTION 61%
100.0000 mL | Freq: Once | INTRAVENOUS | Status: AC | PRN
  Administered 2016-02-13: 100 mL via INTRAVENOUS

## 2016-02-13 MED ORDER — DIATRIZOATE MEGLUMINE & SODIUM 66-10 % PO SOLN
30.0000 mL | Freq: Once | ORAL | Status: AC
  Administered 2016-02-13: 30 mL via ORAL

## 2016-02-13 NOTE — Patient Instructions (Addendum)
[facility] are to go [facility] to [facility] now for your [facility].     IF you received an x-ray today, you will receive an invoice from [facility]. Please contact [facility] at [phone] with questions or concerns regarding your invoice.   IF you received labwork today, you will receive an invoice from [facility]. Please contact [facility] at [phone] with questions or concerns regarding your invoice.   Our billing staff will not be able to assist you with questions regarding bills from these companies.  You will be contacted with the lab results as soon as they are available. The fastest way to get your results is to activate your My Chart account. Instructions are located on the last page of this paperwork. If you have not heard from us regarding the results in 2 weeks, please contact this office.

## 2016-02-13 NOTE — Telephone Encounter (Signed)
Jodie from [facility] is calling to notify that the call report sent to Dr. Karle StarchLukens is already done and is available to look at in the system. [phone]

## 2016-02-13 NOTE — Telephone Encounter (Signed)
I called and spoke with patient. Advised him of the abnormal CT with dilatation of the ileum and jejunum. [facility] then [facility] and gave her report. Ad[facility] for [facility] and possible surgical opinion.

## 2016-02-13 NOTE — ED Notes (Addendum)
Pt BIB family after UC had pt get a CT scan which shows possible bowel blockage. N/V and abdominal pain since this morning. Last BM here today. Alert and oriented.

## 2016-02-13 NOTE — Discharge Instructions (Signed)
Food Choices to Help Relieve Diarrhea, Adult °When you have diarrhea, the foods you eat and your eating habits are very important. Choosing the right foods and drinks can help relieve diarrhea. Also, because diarrhea can last up to 7 days, you need to replace lost fluids and electrolytes (such as sodium, potassium, and chloride) in order to help prevent dehydration.  °WHAT GENERAL GUIDELINES DO I NEED TO FOLLOW? °· Slowly drink 1 cup (8 oz) of fluid for each episode of diarrhea. If you are getting enough fluid, your urine will be clear or pale yellow. °· Eat starchy foods. Some good choices include white rice, white toast, pasta, low-fiber cereal, baked potatoes (without the skin), saltine crackers, and bagels. °· Avoid large servings of any cooked vegetables. °· Limit fruit to two servings per day. A serving is ½ cup or 1 small piece. °· Choose foods with less than 2 g of fiber per serving. °· Limit fats to less than 8 tsp (38 g) per day. °· Avoid fried foods. °· Eat foods that have probiotics in them. Probiotics can be found in certain dairy products. °· Avoid foods and beverages that may increase the speed at which food moves through the stomach and intestines (gastrointestinal tract). Things to avoid include: °· High-fiber foods, such as dried fruit, raw fruits and vegetables, nuts, seeds, and whole grain foods. °· Spicy foods and high-fat foods. °· Foods and beverages sweetened with high-fructose corn syrup, honey, or sugar alcohols such as xylitol, sorbitol, and mannitol. °WHAT FOODS ARE RECOMMENDED? °Grains °White rice. White, French, or pita breads (fresh or toasted), including plain rolls, buns, or bagels. White pasta. Saltine, soda, or graham crackers. Pretzels. Low-fiber cereal. Cooked cereals made with water (such as cornmeal, farina, or cream cereals). Plain muffins. Matzo. Melba toast. Zwieback.  °Vegetables °Potatoes (without the skin). Strained tomato and vegetable juices. Most well-cooked and canned  vegetables without seeds. Tender lettuce. °Fruits °Cooked or canned applesauce, apricots, cherries, fruit cocktail, grapefruit, peaches, pears, or plums. Fresh bananas, apples without skin, cherries, grapes, cantaloupe, grapefruit, peaches, oranges, or plums.  °Meat and Other Protein Products °Baked or boiled chicken. Eggs. Tofu. Fish. Seafood. Smooth peanut butter. Ground or well-cooked tender beef, ham, veal, lamb, pork, or poultry.  °Dairy °Plain yogurt, kefir, and unsweetened liquid yogurt. Lactose-free milk, buttermilk, or soy milk. Plain hard cheese. °Beverages °Sport drinks. Clear broths. Diluted fruit juices (except prune). Regular, caffeine-free sodas such as ginger ale. Water. Decaffeinated teas. Oral rehydration solutions. Sugar-free beverages not sweetened with sugar alcohols. °Other °Bouillon, broth, or soups made from recommended foods.  °The items listed above may not be a complete list of recommended foods or beverages. Contact your dietitian for more options. °WHAT FOODS ARE NOT RECOMMENDED? °Grains °Whole grain, whole wheat, bran, or rye breads, rolls, pastas, crackers, and cereals. Wild or brown rice. Cereals that contain more than 2 g of fiber per serving. Corn tortillas or taco shells. Cooked or dry oatmeal. Granola. Popcorn. °Vegetables °Raw vegetables. Cabbage, broccoli, Brussels sprouts, artichokes, baked beans, beet greens, corn, kale, legumes, peas, sweet potatoes, and yams. Potato skins. Cooked spinach and cabbage. °Fruits °Dried fruit, including raisins and dates. Raw fruits. Stewed or dried prunes. Fresh apples with skin, apricots, mangoes, pears, raspberries, and strawberries.  °Meat and Other Protein Products °Chunky peanut butter. Nuts and seeds. Beans and lentils. Bacon.  °Dairy °High-fat cheeses. Milk, chocolate milk, and beverages made with milk, such as milk shakes. Cream. Ice cream. °Sweets and Desserts °Sweet rolls, doughnuts, and sweet breads.   Pancakes and waffles. °Fats and  Oils °Butter. Cream sauces. Margarine. Salad oils. Plain salad dressings. Olives. Avocados.  °Beverages °Caffeinated beverages (such as coffee, tea, soda, or energy drinks). Alcoholic beverages. Fruit juices with pulp. Prune juice. Soft drinks sweetened with high-fructose corn syrup or sugar alcohols. °Other °Coconut. Hot sauce. Chili powder. Mayonnaise. Gravy. Cream-based or milk-based soups.  °The items listed above may not be a complete list of foods and beverages to avoid. Contact your dietitian for more information. °WHAT SHOULD I DO IF I BECOME DEHYDRATED? °Diarrhea can sometimes lead to dehydration. Signs of dehydration include dark urine and dry mouth and skin. If you think you are dehydrated, you should rehydrate with an oral rehydration solution. These solutions can be purchased at pharmacies, retail stores, or online.  °Drink ½-1 cup (120-240 mL) of oral rehydration solution each time you have an episode of diarrhea. If drinking this amount makes your diarrhea worse, try drinking smaller amounts more often. For example, drink 1-3 tsp (5-15 mL) every 5-10 minutes.  °A general rule for staying hydrated is to drink 1½-2 L of fluid per day. Talk to your health care provider about the specific amount you should be drinking each day. Drink enough fluids to keep your urine clear or pale yellow. °  °This information is not intended to replace advice given to you by your health care provider. Make sure you discuss any questions you have with your health care provider. °  °Document Released: 11/10/2003 Document Revised: 09/10/2014 Document Reviewed: 07/13/2013 °Elsevier Interactive Patient Education ©2016 [employer] ° °Viral Gastroenteritis °Viral gastroenteritis is also known as stomach flu. This condition affects the stomach and intestinal tract. It can cause sudden diarrhea and vomiting. The illness typically lasts 3 to 8 days. Most people develop an immune response that eventually gets rid of the virus.  While this natural response develops, the virus can make you quite ill. °CAUSES  °Many different viruses can cause gastroenteritis, such as rotavirus or noroviruses. You can catch one of these viruses by consuming contaminated food or water. You may also catch a virus by sharing utensils or other personal items with an infected person or by touching a contaminated surface. °SYMPTOMS  °The most common symptoms are diarrhea and vomiting. These problems can cause a severe loss of body fluids (dehydration) and a body salt (electrolyte) imbalance. Other symptoms may include: °· Fever. °· Headache. °· Fatigue. °· Abdominal pain. °DIAGNOSIS  °Your caregiver can usually diagnose viral gastroenteritis based on your symptoms and a physical exam. A stool sample may also be taken to test for the presence of viruses or other infections. °TREATMENT  °This illness typically goes away on its own. Treatments are aimed at rehydration. The most serious cases of viral gastroenteritis involve vomiting so severely that you are not able to keep fluids down. In these cases, fluids must be given through an intravenous line (IV). °HOME CARE INSTRUCTIONS  °· Drink enough fluids to keep your urine clear or pale yellow. Drink small amounts of fluids frequently and increase the amounts as tolerated. °· Ask your caregiver for specific rehydration instructions. °· Avoid: °¨ Foods high in sugar. °¨ Alcohol. °¨ Carbonated drinks. °¨ Tobacco. °¨ Juice. °¨ Caffeine drinks. °¨ Extremely hot or cold fluids. °¨ Fatty, greasy foods. °¨ Too much intake of anything at one time. °¨ Dairy products until 24 to 48 hours after diarrhea stops. °· You may consume probiotics. Probiotics are active cultures of beneficial bacteria. They may lessen the amount and   number of diarrheal stools in adults. Probiotics can be found in yogurt with active cultures and in supplements. °· Wash your hands well to avoid spreading the virus. °· Only take over-the-counter or  prescription medicines for pain, discomfort, or fever as directed by your caregiver. Do not give aspirin to children. Antidiarrheal medicines are not recommended. °· Ask your caregiver if you should continue to take your regular prescribed and over-the-counter medicines. °· Keep all follow-up appointments as directed by your caregiver. °SEEK IMMEDIATE MEDICAL CARE IF:  °· You are unable to keep fluids down. °· You do not urinate at least once every 6 to 8 hours. °· You develop shortness of breath. °· You notice blood in your stool or vomit. This may look like coffee grounds. °· You have abdominal pain that increases or is concentrated in one small area (localized). °· You have persistent vomiting or diarrhea. °· You have a fever. °· The patient is a child younger than 3 months, and he or she has a fever. °· The patient is a child older than 3 months, and he or she has a fever and persistent symptoms. °· The patient is a child older than 3 months, and he or she has a fever and symptoms suddenly get worse. °· The patient is a baby, and he or she has no tears when crying. °MAKE SURE YOU:  °· Understand these instructions. °· Will watch your condition. °· Will get help right away if you are not doing well or get worse. °  °This information is not intended to replace advice given to you by your health care provider. Make sure you discuss any questions you have with your health care provider. °  °Document Released: 08/20/2005 Document Revised: 11/12/2011 Document Reviewed: 06/06/2011 °Elsevier Interactive Patient Education ©2016 [employer] Inc. ° °

## 2016-02-13 NOTE — Telephone Encounter (Signed)
Dr. Cleta Albertsaub spoke with pt already in previous note.

## 2016-02-13 NOTE — ED Notes (Signed)
MD called to say pt would be coming over for possible need for abdominal surgery. N/V/D, diabetic, CT showed possible obstruction on scan. Coming POV

## 2016-02-13 NOTE — Progress Notes (Signed)
Subjective:  This chart was scribed for Lesle ChrisSteven Tanis Burnley MD, by Veverly FellsHatice Demirci,scribe, at [facility].  This patient was seen in room 5 and the patient's care was started at 10:22 AM.    Patient ID: Dustin FothergillJohn T Granholm, male    DOB: Aug 15, 1971, 45 y.o.   MRN: [id number]  Chief Complaint  Patient presents with  . Abdominal Pain  . Emesis  . Diarrhea    HPI  HPI Comments: Dustin Baker is a 45 y.o. male who presents to the [facility] complaining of constant/increasing abdominal pain (6 am this morning) with associated symptoms of diarrhea (brown- 1 time) and emesis (1 time) onset this morning at 8 am.  He has been having hot flashes intermittently.  He has not had anything to eat today.  Patient has a history of diabetes which he is trying to get under control (by endocrinologist Dr. Sharl MaKerr).  Patient denies traveling recently or any illnesses at home.  Patient lives at home with his son. He still has his appendix and gall bladder. He has no other concerns today.    Patient Active Problem List   Diagnosis Date Noted  . Anginal chest pain at rest [facility](HCC) 12/01/2013  . Type 2 diabetes mellitus (HCC) 12/01/2013  . Essential hypertension 12/01/2013  . Hyperlipidemia 12/01/2013   Past Medical History  Diagnosis Date  . Diabetes mellitus without complication (HCC)   . Hypertension   . Hives of unknown origin   . High cholesterol   . Allergy    Past Surgical History  Procedure Laterality Date  . None    . Vasectomy     Allergies  Allergen Reactions  . Penicillins Other (See Comments)    Childhood reaction   Prior to Admission medications   Medication Sig Start Date End Date Taking? Authorizing Provider  atorvastatin (LIPITOR) 20 MG tablet Take 20 mg by mouth daily.   Yes Historical Provider, MD  glimepiride (AMARYL) 2 MG tablet Take 2 mg by mouth daily with breakfast. Reported on 02/13/2016    Historical Provider, MD  lisinopril (PRINIVIL,ZESTRIL) 5 MG  tablet Take 5 mg by mouth daily. Reported on 02/13/2016    Historical Provider, MD   Social History   Social History  . Marital Status: Married    Spouse Name: N/A  . Number of Children: N/A  . Years of Education: N/A   Occupational History  . Not on file.   Social History Main Topics  . Smoking status: Never Smoker   . Smokeless tobacco: Not on file  . Alcohol Use: 3.0 - 12.0 oz/week    5-20 Cans of beer per week     Comment: used to drink hard liquor, none for approx 12 months  . Drug Use: No  . Sexual Activity: No   Other Topics Concern  . Not on file   Social History Narrative     Review of Systems  Constitutional: Negative for fever and chills.  Eyes: Negative for pain, redness and itching.  Gastrointestinal: Positive for vomiting, abdominal pain and diarrhea.  Musculoskeletal: Negative for neck pain and neck stiffness.  Neurological: Negative for seizures, syncope and speech difficulty.       Objective:   Physical Exam Filed Vitals:   02/13/16 0921  BP: 126/80  Pulse: 108  Temp: 98.8 F (37.1 C)  TempSrc: Oral  Resp: 17  Height: 5' 10.5" (1.791 m)  Weight: 268 lb (121.564 kg)  SpO2: 96%  CONSTITUTIONAL: Well developed/well nourished HEAD: Normocephalic/atraumatic EYES: EOMI/PERRL SPINE/BACK:entire spine nontender CV: S1/S2 noted, no murmurs/rubs/gallops noted LUNGS: Lungs are clear to auscultation bilaterally, no apparent distress ABDOMEN: decreased bowel sounds, tender left mid abdomen and also right upper abdomen, no masses present.  NEURO: Pt is awake/alert/appropriate, moves all extremitiesx4.  No facial droop.   EXTREMITIES: pulses normal/equal, full ROM SKIN: warm, color normal PSYCH: no abnormalities of mood noted, alert and oriented to situation  Results for orders placed or performed in visit on 02/13/16  POCT CBC  Result Value Ref Range   WBC 8.0 4.6 - 10.2 K/uL   Lymph, poc 2.0 0.6 - 3.4   POC LYMPH PERCENT 25.3 10 - 50 %L   MID  (cbc) 0.3 0 - 0.9   POC MID % 3.6 0 - 12 %M   POC Granulocyte 5.7 2 - 6.9   Granulocyte percent 71.1 37 - 80 %G   RBC 5.18 4.69 - 6.13 M/uL   Hemoglobin 15.7 14.1 - 18.1 g/dL   HCT, POC 52.8 41.3 - 53.7 %   MCV 87.6 80 - 97 fL   MCH, POC 30.2 27 - 31.2 pg   MCHC 34.5 31.8 - 35.4 g/dL   RDW, POC 24.4 %   Platelet Count, POC 183 142 - 424 K/uL   MPV 8.2 0 - 99.8 fL  POCT glucose (manual entry)  Result Value Ref Range   POC Glucose 246 (A) 70 - 99 mg/dl  POCT urinalysis dipstick  Result Value Ref Range   Color, UA yellow yellow   Clarity, UA clear clear   Glucose, UA =500 (A) negative   Bilirubin, UA negative negative   Ketones, POC UA negative negative   Spec Grav, UA 1.015    Blood, UA trace-intact (A) negative   pH, UA 5.5    Protein Ur, POC negative negative   Urobilinogen, UA 0.2    Nitrite, UA Negative Negative   Leukocytes, UA Negative Negative  POCT Microscopic Urinalysis ([facility])  Result Value Ref Range   WBC,UR,HPF,POC None None WBC/hpf   RBC,UR,HPF,POC None None RBC/hpf   Bacteria None None, Too numerous to count   Mucus Absent Absent   Epithelial Cells, UR Per Microscopy None None, Too numerous to count cells/hpf   Dg Abd Acute W/chest  02/13/2016  CLINICAL DATA:  Generalized abdominal pain. EXAM: DG ABDOMEN ACUTE W/ 1V CHEST COMPARISON:  11/11/2013 chest radiograph. FINDINGS: Low lung volumes. Stable cardiomediastinal silhouette with normal heart size. No pneumothorax. No pleural effusion. Stable mild scarring versus atelectasis at the left lung base. No pulmonary edema. No acute consolidative airspace disease. No dilated small bowel loops or air-fluid levels. Physiologic stool in the colon. No evidence of pneumatosis, pneumoperitoneum or pathologic soft tissue calcifications. IMPRESSION: 1. Low lung volumes. Stable mild scarring versus atelectasis at the left lung base. Otherwise no active disease in the chest. 2. Nonobstructive bowel gas pattern. Electronically Signed    By: Delbert Phenix M.D.   On: 02/13/2016 11:25        Assessment & Plan:  Patient will need scanning of his abdomen. We'll go ahead and arrange this as an outpatient at [address] [place] through the radiology department there. He will need a BUN/creatinine because of his diabetes diagnosis.I personally performed the services described in this documentation, which was scribed in my presence. The recorded information has been reviewed and is accurate.

## 2016-02-13 NOTE — ED Provider Notes (Signed)
CSN: [id number]     Arrival date & time 02/13/16  1732 History   First MD Initiated Contact with Patient 02/13/16 1851     Chief Complaint  Patient presents with  . Abdominal Pain      Patient is a 45 y.o. male presenting with abdominal pain.  Abdominal Pain Associated symptoms: diarrhea and vomiting (x1)   Associated symptoms: no chills and no fever    Patient presents with a one-day history of abdominal pain and vomiting and diarrhea this morning.  He went to the urgent care and a CT scan of the abdomen was done.  After he drank the contrast material he had increased abdominal pain and some more diarrhea.  His pain is gone after the diarrhea.  He states he is hungry right now.  He had lab work including a CBC and a CMP done at the urgent care both which were unremarkable.  Patient has no previous surgical history area and he has a diabetic. Past Medical History  Diagnosis Date  . Diabetes mellitus without complication (HCC)   . Hypertension   . Hives of unknown origin   . High cholesterol   . Allergy    Past Surgical History  Procedure Laterality Date  . None    . Vasectomy     Family History  Problem Relation Age of Onset  . Diabetes Father   . Diabetes Paternal Grandfather    Social History  Substance Use Topics  . Smoking status: Never Smoker   . Smokeless tobacco: None  . Alcohol Use: 3.0 - 12.0 oz/week    5-20 Cans of beer per week     Comment: used to drink hard liquor, none for approx 12 months    Review of Systems  Constitutional: Negative for fever and chills.  Gastrointestinal: Positive for vomiting (x1), abdominal pain and diarrhea. Negative for blood in stool and abdominal distention.      Allergies  Penicillins  Home Medications   Prior to Admission medications   Medication Sig Start Date End Date Taking? Authorizing Provider  atorvastatin (LIPITOR) 20 MG tablet Take 20 mg by mouth daily.   Yes Historical Provider, MD  empagliflozin (JARDIANCE)  10 MG TABS tablet Take 10 mg by mouth daily. Reported on 02/13/2016   Yes Historical Provider, MD  ibuprofen (ADVIL,MOTRIN) 200 MG tablet Take 400 mg by mouth every 6 (six) hours as needed for headache.   Yes Historical Provider, MD  Insulin Glargine (BASAGLAR KWIKPEN) 100 UNIT/ML SOPN Inject 84 Units into the skin daily.  01/19/16  Yes Historical Provider, MD  lisinopril (PRINIVIL,ZESTRIL) 5 MG tablet Take 5 mg by mouth daily. Reported on 02/13/2016   Yes Historical Provider, MD  omeprazole (PRILOSEC) 20 MG capsule Take 20 mg by mouth daily.  11/29/15  Yes Historical Provider, MD   BP 138/100 mmHg  Pulse 92  Temp(Src) 98.3 F (36.8 C) (Oral)  Resp 16  SpO2 98% Physical Exam  Constitutional: He is oriented to person, place, and time. He appears well-developed and well-nourished. No distress.  HENT:  Head: Normocephalic and atraumatic.  Eyes: Pupils are equal, round, and reactive to light.  Neck: Normal range of motion.  Cardiovascular: Normal rate and intact distal pulses.   Pulmonary/Chest: No respiratory distress.  Abdominal: Soft. Normal appearance and bowel sounds are normal. He exhibits no distension. There is no tenderness. There is no rebound and no guarding.  Musculoskeletal: Normal range of motion.  Neurological: He is alert and oriented to person,  place, and time. No cranial nerve deficit.  Skin: Skin is warm and dry. No rash noted.  Psychiatric: He has a normal mood and affect. His behavior is normal.  Nursing note and vitals reviewed.   ED Course  Procedures (including critical care time) Labs Review Labs Reviewed - No data to display  Imaging Review No results found. I have personally reviewed and evaluated these images and lab results as part of my medical decision-making.  Review of the patient's CT scan along with his history and physical exam are more indicative of a gastroenteritis.  Patient states he is hungry and he wants to eat.  I explained that he should eat a  small amount of soup when he gets home and if the has no problems he can add some more soup and then advance his diet tomorrow.  He is return to emergency room she developed persistent vomiting, return of pain, or fever.  Patient understands and wants to be discharged.  MDM   Final diagnoses:  Gastroenteritis        Nelva Nayobert Kyoko Elsea, MD 02/16/16 [phone]

## 2016-10-18 ENCOUNTER — Emergency Department (HOSPITAL_COMMUNITY): Payer: Self-pay

## 2016-10-18 ENCOUNTER — Emergency Department (HOSPITAL_COMMUNITY)
Admission: EM | Admit: 2016-10-18 | Discharge: 2016-10-18 | Disposition: A | Discharge status: Home / Self Care | Payer: Self-pay | Attending: Emergency Medicine | Admitting: Emergency Medicine

## 2016-10-18 ENCOUNTER — Encounter (HOSPITAL_COMMUNITY): Payer: Self-pay | Admitting: Family Medicine

## 2016-10-18 DIAGNOSIS — R739 Hyperglycemia, unspecified: Secondary | ICD-10-CM

## 2016-10-18 DIAGNOSIS — R1110 Vomiting, unspecified: Secondary | ICD-10-CM | POA: Insufficient documentation

## 2016-10-18 DIAGNOSIS — R6889 Other general symptoms and signs: Secondary | ICD-10-CM

## 2016-10-18 DIAGNOSIS — E1165 Type 2 diabetes mellitus with hyperglycemia: Secondary | ICD-10-CM | POA: Insufficient documentation

## 2016-10-18 DIAGNOSIS — R05 Cough: Secondary | ICD-10-CM | POA: Insufficient documentation

## 2016-10-18 LAB — COMPREHENSIVE METABOLIC PANEL
ALBUMIN: 4.4 g/dL (ref 3.5–5.0)
ALK PHOS: 43 U/L (ref 38–126)
ALT: 33 U/L (ref 17–63)
ANION GAP: 9 (ref 5–15)
AST: 24 U/L (ref 15–41)
BILIRUBIN TOTAL: 1.1 mg/dL (ref 0.3–1.2)
BUN: 11 mg/dL (ref 6–20)
CALCIUM: 9.1 mg/dL (ref 8.9–10.3)
CO2: 22 mmol/L (ref 22–32)
Chloride: 102 mmol/L (ref 101–111)
Creatinine, Ser: 0.83 mg/dL (ref 0.61–1.24)
GFR calc Af Amer: >60 mL/min (ref >60)
GFR calc non Af Amer: >60 mL/min (ref >60)
GLUCOSE: 340 mg/dL — AB (ref 65–99)
Potassium: 3.7 mmol/L (ref 3.5–5.1)
Sodium: 133 mmol/L — ABNORMAL LOW (ref 135–145)
TOTAL PROTEIN: 7.9 g/dL (ref 6.5–8.1)

## 2016-10-18 LAB — CBC WITH DIFFERENTIAL/PLATELET
Basophils Absolute: 0 10*3/uL (ref 0.0–0.1)
Basophils Relative: 0 %
Eosinophils Absolute: 0 10*3/uL (ref 0.0–0.7)
Eosinophils Relative: 0 %
HCT: 43.1 % (ref 39.0–52.0)
HEMOGLOBIN: 14.8 g/dL (ref 13.0–17.0)
Lymphocytes Relative: 9 %
Lymphs Abs: 1 10*3/uL (ref 0.7–4.0)
MCH: 30.2 pg (ref 26.0–34.0)
MCHC: 34.3 g/dL (ref 30.0–36.0)
MCV: 88 fL (ref 78.0–100.0)
Monocytes Absolute: 0.8 10*3/uL (ref 0.1–1.0)
Monocytes Relative: 7 %
Neutro Abs: 9.3 10*3/uL — ABNORMAL HIGH (ref 1.7–7.7)
Neutrophils Relative %: 84 %
Platelets: 175 10*3/uL (ref 150–400)
RBC: 4.9 MIL/uL (ref 4.22–5.81)
RDW: 12.9 % (ref 11.5–15.5)
WBC: 11.2 10*3/uL — AB (ref 4.0–10.5)

## 2016-10-18 LAB — CBG MONITORING, ED
Glucose-Capillary: 322 mg/dL — ABNORMAL HIGH (ref 65–99)
Glucose-Capillary: 265 mg/dL — ABNORMAL HIGH (ref 65–99)

## 2016-10-18 MED ORDER — METFORMIN HCL 1000 MG PO TABS: 1000.0000 mg | ORAL_TABLET | Freq: Two times a day (BID) | ORAL | 0 refills | Status: DC

## 2016-10-18 MED ORDER — SODIUM CHLORIDE 0.9 % IV BOLUS (SEPSIS)
1000.0000 mL | Freq: Once | INTRAVENOUS | Status: AC
  Administered 2016-10-18: 1000 mL via INTRAVENOUS

## 2016-10-18 MED ORDER — OSELTAMIVIR PHOSPHATE 75 MG PO CAPS: 75.0000 mg | ORAL_CAPSULE | Freq: Two times a day (BID) | ORAL | 0 refills | Status: DC

## 2016-10-18 MED ORDER — ONDANSETRON HCL 4 MG/2ML IJ SOLN
4.0000 mg | Freq: Once | INTRAMUSCULAR | Status: AC
  Administered 2016-10-18: 4 mg via INTRAVENOUS
  Filled 2016-10-18: qty 2

## 2016-10-18 MED ORDER — ONDANSETRON 4 MG PO TBDP: 4.0000 mg | ORAL_TABLET | Freq: Three times a day (TID) | ORAL | 0 refills | Status: DC | PRN

## 2016-10-18 MED ORDER — ACETAMINOPHEN 325 MG PO TABS
650.0000 mg | ORAL_TABLET | Freq: Once | ORAL | Status: AC | PRN
  Administered 2016-10-18: 650 mg via ORAL
  Filled 2016-10-18: qty 2

## 2016-10-18 NOTE — Discharge Instructions (Signed)
You were seen today for flulike symptoms. Your workup is largely reassuring with the exception of elevated blood sugars. Make sure to monitor your blood sugars at home. Take your medications as prescribed. He'll be given nausea medication and Tamiflu. If symptoms worsen or you're not able to stay hydrated, he should be reevaluated.

## 2016-10-18 NOTE — ED Triage Notes (Signed)
Pt reports he is experiencing flu-like symptoms( cough, fever )with vomiting. Symptoms started around 23:00 tonight. Pt has a hx of diabetes. Patient didn't take any medication for symptoms.

## 2016-10-18 NOTE — ED Notes (Signed)
Pt was given diet ginger ale for po challenge---- pt tolerating well.

## 2016-10-18 NOTE — ED Provider Notes (Signed)
[facility]PT Provider Note   CSN: [id number] Arrival date & time: 10/18/16  7829  By signing my name below, I, Modena Jansky, attest that this documentation has been prepared under the direction and in the presence of Shon Baton, MD. Electronically Signed: Modena Jansky, Scribe. 10/18/2016. 3:46 AM.  History   Chief Complaint Chief Complaint  Patient presents with  . Influenza  . Emesis    The history is provided by the patient. No language interpreter was used.   HPI Comments: Dustin Baker is a 46 y.o. male who presents to the Emergency Department complaining of intermittent moderate cough that started about 5 hours ago. He states has been having gradually worsening URI-like symptoms. He took tylenol with some relief. He reports associated chills, vomiting (6-7 episodes), and fever (Tmax: 101.8). Pt's temperature in the ED today was 102.6. He admits to a sick contact. He denies any influenza vaccination this year, dysuria, hematuria, back pain, or other complaints.   He is noncompliant with his diabetes medications.  PCP: Gaye Alken, MD  Past Medical History:  Diagnosis Date  . Allergy   . Diabetes mellitus without complication (HCC)   . High cholesterol   . Hives of unknown origin   . Hypertension     Patient Active Problem List   Diagnosis Date Noted  . Anginal chest pain at rest [facility]) 12/01/2013  . Type 2 diabetes mellitus (HCC) 12/01/2013  . Essential hypertension 12/01/2013  . Hyperlipidemia 12/01/2013    Past Surgical History:  Procedure Laterality Date  . none    . VASECTOMY         Home Medications    Prior to Admission medications   Medication Sig Start Date End Date Taking? Authorizing Provider  atorvastatin (LIPITOR) 20 MG tablet Take 20 mg by mouth daily.    Historical Provider, MD  empagliflozin (JARDIANCE) 10 MG TABS tablet Take 10 mg by mouth daily. Reported on 02/13/2016    Historical Provider, MD   ibuprofen (ADVIL,MOTRIN) 200 MG tablet Take 400 mg by mouth every 6 (six) hours as needed for headache.    Historical Provider, MD  Insulin Glargine (BASAGLAR KWIKPEN) 100 UNIT/ML SOPN Inject 84 Units into the skin daily.  01/19/16   Historical Provider, MD  lisinopril (PRINIVIL,ZESTRIL) 5 MG tablet Take 5 mg by mouth daily. Reported on 02/13/2016    Historical Provider, MD  metFORMIN (GLUCOPHAGE) 1000 MG tablet Take 1 tablet (1,000 mg total) by mouth 2 (two) times daily. 10/18/16   Shon Baton, MD  omeprazole (PRILOSEC) 20 MG capsule Take 20 mg by mouth daily.  11/29/15   Historical Provider, MD  ondansetron (ZOFRAN ODT) 4 MG disintegrating tablet Take 1 tablet (4 mg total) by mouth every 8 (eight) hours as needed for nausea or vomiting. 10/18/16   Shon Baton, MD  oseltamivir (TAMIFLU) 75 MG capsule Take 1 capsule (75 mg total) by mouth every 12 (twelve) hours. 10/18/16   Shon Baton, MD    Family History Family History  Problem Relation Age of Onset  . Diabetes Father   . Diabetes Paternal Grandfather     Social History Social History  Substance Use Topics  . Smoking status: Never Smoker  . Smokeless tobacco: Never Used  . Alcohol use Yes     Comment: Once a week.      Allergies   Penicillins   Review of Systems Review of Systems  Constitutional: Positive for chills and fever (TmaxL: 101.8).  Respiratory: Positive for cough.   Genitourinary: Negative for dysuria and hematuria.  Musculoskeletal: Negative for back pain.  All other systems reviewed and are negative.    Physical Exam Updated Vital Signs BP 141/97 (BP Location: Left Arm)   Pulse (!) 132   Temp 102.6 F (39.2 C) (Oral)   Resp 20   Ht 5\' 11"  (1.803 m)   Wt 265 lb (120.2 kg)   SpO2 95%   BMI 36.96 kg/m   Physical Exam  Constitutional: He is oriented to person, place, and time. He appears well-developed and well-nourished.  HENT:  Head: Normocephalic and atraumatic.  Eyes: Pupils are  equal, round, and reactive to light.  Neck: Neck supple.  Cardiovascular: Regular rhythm and normal heart sounds.   No murmur heard. Tachycardiac  Pulmonary/Chest: Effort normal and breath sounds normal. No respiratory distress. He has no wheezes.  Abdominal: Soft. Bowel sounds are normal. There is no tenderness. There is no rebound.  Musculoskeletal: He exhibits no edema.  Lymphadenopathy:    He has no cervical adenopathy.  Neurological: He is alert and oriented to person, place, and time.  Skin: Skin is warm. He is diaphoretic.  Psychiatric: He has a normal mood and affect.  Nursing note and vitals reviewed.    ED Treatments / Results  DIAGNOSTIC STUDIES: Oxygen Saturation is 95% on RA, normal by my interpretation.    COORDINATION OF CARE: 3:50 AM- Pt advised of plan for treatment and pt agrees.  Labs (all labs ordered are listed, but only abnormal results are displayed) Labs Reviewed  CBC WITH DIFFERENTIAL/PLATELET - Abnormal; Notable for the following:       Result Value   WBC 11.2 (*)    Neutro Abs 9.3 (*)    All other components within normal limits  COMPREHENSIVE METABOLIC PANEL - Abnormal; Notable for the following:    Sodium 133 (*)    Glucose, Bld 340 (*)    All other components within normal limits  CBG MONITORING, ED - Abnormal; Notable for the following:    Glucose-Capillary 322 (*)    All other components within normal limits  CBG MONITORING, ED    EKG  EKG Interpretation None       Radiology Dg Chest 2 View  Result Date: 10/18/2016 CLINICAL DATA:  Acute onset cough, nausea, vomiting, body aches and weakness. History of hypertension, diabetes. EXAM: CHEST  2 VIEW COMPARISON:  Chest radiograph February 13, 2016 FINDINGS: Cardiomediastinal silhouette is unremarkable for this low inspiratory examination with crowded vasculature markings. The lungs are clear without pleural effusions or focal consolidations. Stable LEFT lung base scarring. Trachea projects  midline and there is no pneumothorax. Included soft tissue planes and osseous structures are non-suspicious. Mild degenerative change of the thoracic spine. IMPRESSION: No acute cardiopulmonary process for this low inspiratory examination. Electronically Signed   By: Awilda Metroourtnay  Bloomer M.D.   On: 10/18/2016 04:02    Procedures Procedures (including critical care time)  Medications Ordered in ED Medications  acetaminophen (TYLENOL) tablet 650 mg (650 mg Oral Given 10/18/16 0315)  sodium chloride 0.9 % bolus 1,000 mL (0 mLs Intravenous Stopped 10/18/16 0504)  ondansetron (ZOFRAN) injection 4 mg (4 mg Intravenous Given 10/18/16 0356)     Initial Impression / Assessment and Plan / ED Course  I have reviewed the triage vital signs and the nursing notes.  Pertinent labs & imaging results that were available during my care of the patient were reviewed by me and considered in my medical decision  making (see chart for details).     Patient presents with fever, cough, vomiting. Onset of symptoms last night. Reports sick contact. He is febrile to 102.6. Mildly tachycardic. Otherwise he is nontoxic-appearing. Patient was given fluids and Zofran. Lab work notable for hyperglycemia without an anion gap. He is a known noncompliant diabetic. Patient reports he feels much better after fluids. He is able to tolerate fluids by mouth. Discussed with patient supportive measures at home with Zofran and ibuprofen for aches and pains. Given his diabetes, he was also empirically offered Tamiflu. He will be given a prescription for his metformin.  After history, exam, and medical workup I feel the patient has been appropriately medically screened and is safe for discharge home. Pertinent diagnoses were discussed with the patient. Patient was given return precautions.   Final Clinical Impressions(s) / ED Diagnoses   Final diagnoses:  Flu-like symptoms  Hyperglycemia    New Prescriptions Discharge Medication List  as of 10/18/2016  5:21 AM    START taking these medications   Details  metFORMIN (GLUCOPHAGE) 1000 MG tablet Take 1 tablet (1,000 mg total) by mouth 2 (two) times daily., Starting Thu 10/18/2016, Print    ondansetron (ZOFRAN ODT) 4 MG disintegrating tablet Take 1 tablet (4 mg total) by mouth every 8 (eight) hours as needed for nausea or vomiting., Starting Thu 10/18/2016, Print    oseltamivir (TAMIFLU) 75 MG capsule Take 1 capsule (75 mg total) by mouth every 12 (twelve) hours., Starting Thu 10/18/2016, Print       I personally performed the services described in this documentation, which was scribed in my presence. The recorded information has been reviewed and is accurate.     Shon Baton, MD 10/18/16 [phone]

## 2017-04-24 DIAGNOSIS — H5213 Myopia, bilateral: Secondary | ICD-10-CM | POA: Diagnosis not present

## 2017-04-24 DIAGNOSIS — E119 Type 2 diabetes mellitus without complications: Secondary | ICD-10-CM | POA: Diagnosis not present

## 2018-05-26 DIAGNOSIS — S91119A Laceration without foreign body of unspecified toe without damage to nail, initial encounter: Secondary | ICD-10-CM | POA: Diagnosis not present

## 2018-05-26 DIAGNOSIS — E119 Type 2 diabetes mellitus without complications: Secondary | ICD-10-CM | POA: Diagnosis not present

## 2018-06-04 DIAGNOSIS — E7800 Pure hypercholesterolemia, unspecified: Secondary | ICD-10-CM | POA: Diagnosis not present

## 2018-06-04 DIAGNOSIS — Z794 Long term (current) use of insulin: Secondary | ICD-10-CM | POA: Diagnosis not present

## 2018-06-04 DIAGNOSIS — E1140 Type 2 diabetes mellitus with diabetic neuropathy, unspecified: Secondary | ICD-10-CM | POA: Diagnosis not present

## 2018-06-04 DIAGNOSIS — E11621 Type 2 diabetes mellitus with foot ulcer: Secondary | ICD-10-CM | POA: Diagnosis not present

## 2018-06-04 DIAGNOSIS — Z0000 Encounter for general adult medical examination without abnormal findings: Secondary | ICD-10-CM | POA: Diagnosis not present

## 2018-06-04 DIAGNOSIS — I10 Essential (primary) hypertension: Secondary | ICD-10-CM | POA: Diagnosis not present

## 2018-06-09 ENCOUNTER — Ambulatory Visit (INDEPENDENT_AMBULATORY_CARE_PROVIDER_SITE_OTHER): Payer: BLUE CROSS/BLUE SHIELD | Admitting: Podiatry

## 2018-06-09 ENCOUNTER — Ambulatory Visit (INDEPENDENT_AMBULATORY_CARE_PROVIDER_SITE_OTHER): Payer: BLUE CROSS/BLUE SHIELD

## 2018-06-09 DIAGNOSIS — R234 Changes in skin texture: Secondary | ICD-10-CM | POA: Diagnosis not present

## 2018-06-09 DIAGNOSIS — L97519 Non-pressure chronic ulcer of other part of right foot with unspecified severity: Secondary | ICD-10-CM

## 2018-06-09 DIAGNOSIS — E1165 Type 2 diabetes mellitus with hyperglycemia: Secondary | ICD-10-CM

## 2018-06-09 MED ORDER — MUPIROCIN 2 % EX OINT: 1.0000 "application " | TOPICAL_OINTMENT | Freq: Two times a day (BID) | CUTANEOUS | 2 refills | Status: DC

## 2018-06-10 NOTE — Progress Notes (Signed)
Subjective:   Patient ID: Dustin Baker, male   DOB: 47 y.o.   MRN: [id number]   HPI 47 year old male presents the office today for concerns of a callus that cracked open about 3 weeks on the bottom of his right big toe.  He states he may have stepped on the shower some of the beach when he noticed this.  Denies any bleeding he states the area has healed.  His last A1c was around 11.1 he states that he just recently got insurance and he was started on diabetes medication however given the cost he did not start this to therefore he is not taking any diabetes medication at this time.  He denies any swelling, redness, drainage or pus at this time.  He has no other concerns.   Review of Systems  All other systems reviewed and are negative.  Past Medical History:  Diagnosis Date  . Allergy   . Diabetes mellitus without complication (HCC)   . High cholesterol   . Hives of unknown origin   . Hypertension     Past Surgical History:  Procedure Laterality Date  . none    . VASECTOMY       Current Outpatient Medications:  .  atorvastatin (LIPITOR) 20 MG tablet, Take 20 mg by mouth daily., Disp: , Rfl:  .  empagliflozin (JARDIANCE) 10 MG TABS tablet, Take 10 mg by mouth daily. Reported on 02/13/2016, Disp: , Rfl:  .  ibuprofen (ADVIL,MOTRIN) 200 MG tablet, Take 400 mg by mouth every 6 (six) hours as needed for headache., Disp: , Rfl:  .  Insulin Glargine (BASAGLAR KWIKPEN) 100 UNIT/ML SOPN, Inject 84 Units into the skin daily. , Disp: , Rfl:  .  lisinopril (PRINIVIL,ZESTRIL) 5 MG tablet, Take 5 mg by mouth daily. Reported on 02/13/2016, Disp: , Rfl:  .  metFORMIN (GLUCOPHAGE) 1000 MG tablet, Take 1 tablet (1,000 mg total) by mouth 2 (two) times daily., Disp: 30 tablet, Rfl: 0 .  mupirocin ointment (BACTROBAN) 2 %, Apply 1 application topically 2 (two) times daily., Disp: 30 g, Rfl: 2 .  omeprazole (PRILOSEC) 20 MG capsule, Take 20 mg by mouth daily. , Disp: , Rfl:  .  ondansetron (ZOFRAN  ODT) 4 MG disintegrating tablet, Take 1 tablet (4 mg total) by mouth every 8 (eight) hours as needed for nausea or vomiting., Disp: 20 tablet, Rfl: 0 .  oseltamivir (TAMIFLU) 75 MG capsule, Take 1 capsule (75 mg total) by mouth every 12 (twelve) hours., Disp: 10 capsule, Rfl: 0 .  sulfamethoxazole-trimethoprim (BACTRIM DS,SEPTRA DS) 800-160 MG tablet, Take 1 tablet by mouth 2 (two) times daily., Disp: , Rfl: 0  Allergies  Allergen Reactions  . Penicillins Other (See Comments)    Childhood reaction          Objective:  Physical Exam  General: AAO x3, NAD  Dermatological: Hyperkeratotic lesion right plantar hallux IPJ with a central skin fissure.  Upon debridement of this area there is a very small superficial opening but there is no drainage or pus and there is no surrounding erythema, ascending sialitis.  There is no fluctuation or crepitation or any malodor there is no clinical signs of infection noted to the toe today.  No edema.  Vascular: Dorsalis Pedis artery and Posterior Tibial artery pedal pulses are 2/4 bilateral with immedate capillary fill time. There is no pain with calf compression, swelling, warmth, erythema.   Neruologic: Grossly intact via light touch bilateral.  Protective threshold with Mathis Dad  monofilament intact to all pedal sites bilateral.   Musculoskeletal: No gross boney pedal deformities bilateral. No pain, crepitus, or limitation noted with foot and ankle range of motion bilateral. Muscular strength 5/5 in all groups tested bilateral.  Gait: Unassisted, Nonantalgic.     Assessment:   Hyperkeratotic lesion, skin fissure right hallux without signs of infection     Plan:  -Treatment options discussed including all alternatives, risks, and complications -Etiology of symptoms were discussed -X-rays were obtained and reviewed with the patient.  Mild swelling noted on x-ray although clinically does not appear to be significantly swollen.  There is no  evidence of foreign body.  No evidence of acute fracture. -I sharply debrided the hyperkeratotic lesion to the any complications of bleeding.  I prescribed mupirocin ointment dressing changes to be applied twice a day.  Offloading at all times.  Monitoring signs or symptoms of infection to call the office immediately should any occur go to the emergency room. -Discussed the importance of glucose control.  Recommend him to follow-up with his primary care physician regards to his diabetes.  He states he is already contacted in regards to changing his medications given cost.  Vivi Barrack DPM

## 2018-06-30 ENCOUNTER — Ambulatory Visit: Payer: BLUE CROSS/BLUE SHIELD | Admitting: Podiatry

## 2018-07-17 DIAGNOSIS — R972 Elevated prostate specific antigen [PSA]: Secondary | ICD-10-CM | POA: Diagnosis not present

## 2018-07-17 DIAGNOSIS — E291 Testicular hypofunction: Secondary | ICD-10-CM | POA: Diagnosis not present

## 2018-07-24 DIAGNOSIS — E7800 Pure hypercholesterolemia, unspecified: Secondary | ICD-10-CM | POA: Diagnosis not present

## 2018-07-24 DIAGNOSIS — I10 Essential (primary) hypertension: Secondary | ICD-10-CM | POA: Diagnosis not present

## 2018-07-24 DIAGNOSIS — E11621 Type 2 diabetes mellitus with foot ulcer: Secondary | ICD-10-CM | POA: Diagnosis not present

## 2018-07-24 DIAGNOSIS — Z23 Encounter for immunization: Secondary | ICD-10-CM | POA: Diagnosis not present

## 2018-07-24 DIAGNOSIS — E1140 Type 2 diabetes mellitus with diabetic neuropathy, unspecified: Secondary | ICD-10-CM | POA: Diagnosis not present

## 2018-07-30 DIAGNOSIS — E291 Testicular hypofunction: Secondary | ICD-10-CM | POA: Diagnosis not present

## 2018-07-30 DIAGNOSIS — R7989 Other specified abnormal findings of blood chemistry: Secondary | ICD-10-CM | POA: Diagnosis not present

## 2019-05-21 ENCOUNTER — Telehealth: Payer: Self-pay | Admitting: *Deleted

## 2019-05-21 MED ORDER — CEPHALEXIN 500 MG PO CAPS: 500.0000 mg | ORAL_CAPSULE | Freq: Four times a day (QID) | ORAL | 0 refills | Status: DC

## 2019-05-21 NOTE — Telephone Encounter (Signed)
Left message informing pt Dr. Jacqualyn Posey stated if he had redness going up leg, fever, swelling or drainage he needed to go to the ED, and we also needed him to make an appt, we would call in an antibiotic to get him to the appt date, call and confirm received.

## 2019-05-21 NOTE — Telephone Encounter (Signed)
Pt called and informed S. Dwale he will come in Monday 05/25/2019.

## 2019-05-25 ENCOUNTER — Ambulatory Visit (INDEPENDENT_AMBULATORY_CARE_PROVIDER_SITE_OTHER): Payer: Self-pay | Admitting: Podiatry

## 2019-05-25 ENCOUNTER — Ambulatory Visit (INDEPENDENT_AMBULATORY_CARE_PROVIDER_SITE_OTHER): Payer: Self-pay

## 2019-05-25 ENCOUNTER — Other Ambulatory Visit: Payer: Self-pay

## 2019-05-25 ENCOUNTER — Encounter: Payer: Self-pay | Admitting: Podiatry

## 2019-05-25 DIAGNOSIS — L97511 Non-pressure chronic ulcer of other part of right foot limited to breakdown of skin: Secondary | ICD-10-CM

## 2019-05-25 DIAGNOSIS — E1165 Type 2 diabetes mellitus with hyperglycemia: Secondary | ICD-10-CM

## 2019-05-25 MED ORDER — MUPIROCIN 2 % EX OINT: 1.0000 "application " | TOPICAL_OINTMENT | Freq: Two times a day (BID) | CUTANEOUS | 2 refills | Status: DC

## 2019-05-25 NOTE — Progress Notes (Signed)
Subjective: 48 year old male presents to the office today for evaluation of a wound on the right foot.  He states that he does he scraped a piece of skin off of the right big toe about 6 weeks ago.  He states that he did have some redness going up the leg.  Due to this we found out last week I started him on antibiotics and recommended to go to the emergency room appointment is not able to do so.  He states that since antibiotics the redness is resolved and he is not had any drainage or pus. Denies any systemic complaints such as fevers, chills, nausea, vomiting. No acute changes since last appointment, and no other complaints at this time.   Objective: AAO x3, NAD DP/PT pulses palpable bilaterally, CRT less than 3 seconds On the plantar aspect of the right hallux after debridement the wound measures 0.7 x 0.7 x 0.2 cm with a granular wound base.  No probing to bone, undermining or tunneling.  No surrounding erythema, ascending cellulitis.  No fluctuation of the tissue any malodor. No pain with calf compression, swelling, warmth, erythema  Assessment: Right hallux ulceration  Plan: -All treatment options discussed with the patient including all alternatives, risks, complications.  -X-rays obtained reviewed.  No definite evidence of acute osteomyelitis, soft tissue erythema.  No fracture or foreign body. -Sharp debrided the wound today utilizing a 312 with scalpel any complications or bleeding.  Recommend daily dressing changes.  Finish antibiotics.  Offloading at all times. -Patient encouraged to call the office with any questions, concerns, change in symptoms.   Return in about 2 weeks (around 06/08/2019).  Trula Slade DPM

## 2019-06-08 ENCOUNTER — Ambulatory Visit: Payer: Self-pay | Admitting: Podiatry

## 2019-07-06 ENCOUNTER — Ambulatory Visit: Payer: Self-pay | Admitting: Podiatry

## 2019-08-07 ENCOUNTER — Ambulatory Visit (INDEPENDENT_AMBULATORY_CARE_PROVIDER_SITE_OTHER): Payer: Self-pay | Admitting: Podiatry

## 2019-08-07 ENCOUNTER — Other Ambulatory Visit: Payer: Self-pay

## 2019-08-07 ENCOUNTER — Ambulatory Visit (INDEPENDENT_AMBULATORY_CARE_PROVIDER_SITE_OTHER): Payer: Self-pay

## 2019-08-07 DIAGNOSIS — L97511 Non-pressure chronic ulcer of other part of right foot limited to breakdown of skin: Secondary | ICD-10-CM

## 2019-08-07 DIAGNOSIS — E1165 Type 2 diabetes mellitus with hyperglycemia: Secondary | ICD-10-CM

## 2019-08-07 DIAGNOSIS — M205X1 Other deformities of toe(s) (acquired), right foot: Secondary | ICD-10-CM

## 2019-08-07 NOTE — Progress Notes (Signed)
Subjective: 48 year old male presents the office today for concerns of chronic wound on the right big toe on the plantar aspect.  He states that overall it is better but still continues.  He denies any drainage or pus he denies any swelling or redness.  He has been using mupirocin ointment.  He states he currently does not have insurance.  He stopped taking Metformin because of potential lawsuits that he saw on television. He currently does not see a PCP but he has previously.  He has Denies any systemic complaints such as fevers, chills, nausea, vomiting. No acute changes since last appointment, and no other complaints at this time.   I checked his blood sugar today and it was 416. He is asymptomatic.   Objective: AAO x3, NAD DP/PT pulses palpable bilaterally, CRT less than 3 seconds There is decreased range of motion of the first MPJ on the right side.  Chronic callus, ulceration of plantar aspect left hallux as well as IPJ.  Upon debridement the wound is superficial but there are 2 associated lesions one proximal and distal with a small skin fissure.  There is no probing to bone, undermining or tunneling.  There is no drainage or pus.  There is no erythema or ascending cellulitis.  There is no malodor. No open lesions or pre-ulcerative lesions.  No pain with calf compression, swelling, warmth, erythema  Assessment: Chronic right hallux ulceration, hallux limitus; uncontrolled diabetes with neuropathy  Plan: -All treatment options discussed with the patient including all alternatives, risks, complications.  -X-rays obtained reviewed.  Arthritic changes present with first IPJ.  No evidence of acute fracture or osteomyelitis.  No foreign body. -I sharply debrided the wound to the bottom of her foot fully scalpel any complications or bleeding.  Recommend switch to using Medihoney dressing changes daily which I dispensed today. -I checked his blood sugar today and is positive.  He has seen primary  care physician but not recently.  He cannot contact her primary care physician's office to get reestablished.  Patient has referral we can do this for him as well.  Discussed the importance of glucose control P as well as other issues. -I will have him follow-up with Liliane Channel.  Unfortunate does not have insurance at this time.  We will see if we can modify over-the-counter to help take pressure off the toe. -Patient encouraged to call the office with any questions, concerns, change in symptoms.   No follow-ups on file.  Trula Slade DPM

## 2019-08-18 ENCOUNTER — Other Ambulatory Visit: Payer: Self-pay

## 2019-08-18 ENCOUNTER — Ambulatory Visit (INDEPENDENT_AMBULATORY_CARE_PROVIDER_SITE_OTHER): Payer: Self-pay | Admitting: Orthotics

## 2019-08-18 ENCOUNTER — Ambulatory Visit (INDEPENDENT_AMBULATORY_CARE_PROVIDER_SITE_OTHER): Payer: Self-pay | Admitting: Podiatry

## 2019-08-18 ENCOUNTER — Encounter: Payer: Self-pay | Admitting: Podiatry

## 2019-08-18 DIAGNOSIS — L97511 Non-pressure chronic ulcer of other part of right foot limited to breakdown of skin: Secondary | ICD-10-CM

## 2019-08-18 DIAGNOSIS — M205X1 Other deformities of toe(s) (acquired), right foot: Secondary | ICD-10-CM

## 2019-08-18 DIAGNOSIS — E1165 Type 2 diabetes mellitus with hyperglycemia: Secondary | ICD-10-CM

## 2019-08-18 NOTE — Progress Notes (Signed)
Offloaded hallux at ulcer site with DB insert we had in closet. Advised new footwear to accomodate.

## 2019-08-19 NOTE — Progress Notes (Signed)
Subjective: 48 year old male presents the office to pick up inserts with Liliane Channel and also for evaluation of wound on the right big toe.  He states that he has been doing well.  He states he is not taking his diabetic medications still but he is going to contact the primary care physician's office that he used to see to get scheduled for an appointment.  He has no symptoms of hyperglycemia today.  He denies any swelling or redness or any drainage coming from the wound. Denies any systemic complaints such as fevers, chills, nausea, vomiting. No acute changes since last appointment, and no other complaints at this time.   Objective: AAO x3, NAD DP/PT pulses palpable bilaterally, CRT less than 3 seconds On the plantar aspect the right hallux is a hyperkeratotic lesion with a small superficial wound present on the IPJ plantarly.  On the plantar MPJ is a transverse skin fissure with a granular wound.  There is no drainage or pus there is no edema, erythema or any clinical signs of infection.  There is no fluctuation crepitation.  There is no malodor. No open lesions or pre-ulcerative lesions.  No pain with calf compression, swelling, warmth, erythema  Assessment: Right hallux ulceration, skin fissure  Plan: -All treatment options discussed with the patient including all alternatives, risks, complications.  -Today I sharply debrided the wound, skin fissure without any complications or bleeding.  Continue daily dressing changes with medihoney.  Offloading at all times.  Inserts dispensed.  Discussed change in shoe gear.  Encouraged him to follow-up with primary care physician for glucose control.  Discussed long-term ramifications of blood sugar. -Patient encouraged to call the office with any questions, concerns, change in symptoms.   Trula Slade DPM

## 2019-09-08 ENCOUNTER — Ambulatory Visit: Payer: Self-pay | Admitting: Podiatry

## 2020-10-27 ENCOUNTER — Other Ambulatory Visit: Payer: Self-pay

## 2020-10-27 ENCOUNTER — Encounter (HOSPITAL_BASED_OUTPATIENT_CLINIC_OR_DEPARTMENT_OTHER): Payer: 59 | Attending: Internal Medicine | Admitting: Internal Medicine

## 2020-10-27 DIAGNOSIS — E11621 Type 2 diabetes mellitus with foot ulcer: Secondary | ICD-10-CM | POA: Diagnosis present

## 2020-10-27 DIAGNOSIS — E1142 Type 2 diabetes mellitus with diabetic polyneuropathy: Secondary | ICD-10-CM | POA: Insufficient documentation

## 2020-10-27 DIAGNOSIS — L97512 Non-pressure chronic ulcer of other part of right foot with fat layer exposed: Secondary | ICD-10-CM | POA: Insufficient documentation

## 2020-10-27 DIAGNOSIS — Z880 Allergy status to penicillin: Secondary | ICD-10-CM | POA: Insufficient documentation

## 2020-10-31 NOTE — Progress Notes (Signed)
Dustin Baker, Dustin Baker ([id number]) . Visit Report for 10/27/2020 Chief Complaint Document Details Patient Name: Date of Service: [facility], Dustin Dustin Baker. 10/27/2020 2:45 PM Medical Record Number: [id number] Patient Account Number: [id number] Date of Birth/Sex: Treating RN: 10/10/1970 (50 y.o. Male) Deon Pilling Primary Care Provider: Leighton Ruff Other Clinician: Referring Provider: Treating Provider/Extender: [name] in Treatment: 0 Information Obtained from: Patient Chief Complaint 10/27/2021; patient is here for review of wounds on his right plantar foot Electronic Signature(s) Signed: 10/27/2020 5:09:37 PM By: Linton Ham MD Entered By: Linton Ham on 10/27/2020 16:07:00 -------------------------------------------------------------------------------- Debridement Details Patient Name: Date of Service: Dustin Baker, [name]. 10/27/2020 2:45 PM Medical Record Number: [id number] Patient Account Number: [id number] Date of Birth/Sex: Treating RN: 1971-06-16 (50 y.o. Male) Deon Pilling Primary Care Provider: Leighton Ruff Other Clinician: Referring Provider: Treating Provider/Extender: [name] in Treatment: 0 Debridement Performed for Assessment: Wound #1 Right Metatarsal head fourth Performed By: Physician Ricard Dillon., MD Debridement Type: Debridement Severity of Tissue Pre Debridement: Fat layer exposed Level of Consciousness (Pre-procedure): Awake and Alert Pre-procedure Verification/Time Out Yes - 15:45 Taken: Start Time: 15:46 Pain Control: Lidocaine 4% Baker opical Solution Baker Area Debrided (L x W): otal 1 (cm) x 1 (cm) = 1 (cm) Tissue and other material debrided: Viable, Non-Viable, Callus, Skin: Dermis , Skin: Epidermis, Fibrin/Exudate Level: Skin/Epidermis Debridement Description: Selective/Open Wound Instrument: Curette Bleeding: Minimum Hemostasis Achieved: Pressure End Time: 15:49 Procedural Pain:  0 Post Procedural Pain: 0 Response to Treatment: Procedure was tolerated well Level of Consciousness (Post- Awake and Alert procedure): Post Debridement Measurements of Total Wound Length: (cm) 0.8 Width: (cm) 0.5 Depth: (cm) 0.3 Volume: (cm) 0.094 Character of Wound/Ulcer Post Debridement: Requires Further Debridement Severity of Tissue Post Debridement: Fat layer exposed Post Procedure Diagnosis Same as Pre-procedure Electronic Signature(s) Signed: 10/27/2020 5:09:37 PM By: Linton Ham MD Signed: 10/27/2020 5:39:27 PM By: Deon Pilling Entered By: Deon Pilling on 10/27/2020 15:49:35 -------------------------------------------------------------------------------- Debridement Details Patient Name: Date of Service: Dustin Baker, Dustin Baker. 10/27/2020 2:45 PM Medical Record Number: [id number] Patient Account Number: [id number] Date of Birth/Sex: Treating RN: December 08, 1970 (50 y.o. Male) Deon Pilling Primary Care Provider: Leighton Ruff Other Clinician: Referring Provider: Treating Provider/Extender: [name] in Treatment: 0 Debridement Performed for Assessment: Wound #2 Right,Plantar Foot Performed By: Physician Ricard Dillon., MD Debridement Type: Debridement Severity of Tissue Pre Debridement: Fat layer exposed Level of Consciousness (Pre-procedure): Awake and Alert Pre-procedure Verification/Time Out Yes - 15:45 Taken: Start Time: 15:46 Pain Control: Lidocaine 4% Baker opical Solution Baker Area Debrided (L x W): otal 0.5 (cm) x 0.5 (cm) = 0.25 (cm) Tissue and other material debrided: Viable, Non-Viable, Callus, Skin: Dermis , Skin: Epidermis, Fibrin/Exudate Level: Skin/Epidermis Debridement Description: Selective/Open Wound Instrument: Curette Bleeding: Minimum Hemostasis Achieved: Pressure End Time: 15:49 Procedural Pain: 0 Post Procedural Pain: 0 Response to Treatment: Procedure was tolerated well Level of Consciousness (Post- Awake  and Alert procedure): Post Debridement Measurements of Total Wound Length: (cm) 0.2 Width: (cm) 0.2 Depth: (cm) 0.3 Volume: (cm) 0.009 Character of Wound/Ulcer Post Debridement: Requires Further Debridement Severity of Tissue Post Debridement: Fat layer exposed Post Procedure Diagnosis Same as Pre-procedure Electronic Signature(s) Signed: 10/27/2020 5:09:37 PM By: Linton Ham MD Signed: 10/27/2020 5:39:27 PM By: Deon Pilling Entered By: Deon Pilling on 10/27/2020 15:50:21 -------------------------------------------------------------------------------- HPI Details Patient Name: Date of Service: Dustin Baker, Dustin Baker. 10/27/2020 2:45 PM Medical Record Number: [id number] Patient Account Number: [id number] Date of  Birth/Sex: Treating RN: September 03, 1971 (50 y.o. 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 He is here for review of 2 wounds on his plantar feet the first is 1 over the fourth metatarsal head that is been there for about 6 months. He has no idea how this happened and simply saw blood on his sock. He was not doing anything specific to this except putting a dry dressing on the top. 2 weeks ago we will noted another 1 more inferiorly on the lateral plantar foot. Did not seek any medical attention until 2 days ago saw his primary doctor a culture was done he was prescribed Bactrim and given Bactroban topically. Past medical history includes hypercholesterolemia, hypertension, type 2 diabetes with peripheral neuropathy, history of EtOH ABI in our clinic was 0.91 on the right Electronic Signature(s) Signed: 10/27/2020 5:09:37 PM By: Linton Ham MD Entered By: Linton Ham on 10/27/2020  16:10:18 -------------------------------------------------------------------------------- Physical Exam Details Patient Name: Date of Service: Dustin Baker, [name]. 10/27/2020 2:45 PM Medical Record Number: [id number] Patient Account Number: [id number] Date of Birth/Sex: Treating RN: 21-Oct-1970 (50 y.o. Male) Deon Pilling Primary Care Provider: Leighton Ruff Other Clinician: Referring Provider: Treating Provider/Extender: [name] in Treatment: 0 Constitutional Patient is hypertensive.. Pulse regular and within target range for patient.Marland Kitchen Respirations regular, non-labored and within target range.. Temperature is normal and within the target range for the patient.Marland Kitchen Appears in no distress. Cardiovascular Pedal pulses palpable. Notes Wound exam; the patient had 2 wounds the first over the fourth metatarsal head small wound with some depth it 0.3 cm however there was circumferential undermining I removed thick skin and subcutaneous tissue from around the wound margin and then using a #5 curette removed fibrinous debris from the wound surface hemostasis with direct pressure Below this on the plantar lateral foot a small open area with thick callus. I remove the callus and subcutaneous debris again hemostasis with direct pressure. There is no evidence of infection in either wound area Electronic Signature(s) Signed: 10/27/2020 5:09:37 PM By: Linton Ham MD Entered By: Linton Ham on 10/27/2020 16:12:15 -------------------------------------------------------------------------------- Physician Orders Details Patient Name: Date of Service: Dustin Baker, Dustin Baker. 10/27/2020 2:45 PM Medical Record Number: [id number] Patient Account Number: [id number] Date of Birth/Sex: Treating RN: [birth year] (50 y.o. Male) Deon Pilling Primary Care Provider: Leighton Ruff Other Clinician: Referring Provider: Treating Provider/Extender: [name] in Treatment: 0 Verbal / Phone Orders: No Diagnosis Coding Follow-up Appointments Return Appointment in 1 week. Bathing/ Shower/ Hygiene May shower with protection but do not get wound dressing(s) wet. - when not changing the dressing. May shower and wash wound with soap and water. - with dressing changes. Edema Control - Lymphedema / SCD / Other Elevate legs to the level of the heart or above for 30 minutes daily and/or when sitting, a frequency of: - 3-4 times day throughout the day. Avoid standing for long periods of time. Moisturize legs daily. - both leg every night before bed. Off-Loading Wedge shoe to: - office to provide to patient today for right foot. patient to wear while walking and standing. only wear regular shoe while driving. Additional Orders / Instructions Other: - check feet every night before bed. Wound Treatment Wound #1 - Metatarsal head fourth Wound Laterality:  Right Cleanser: Soap and Water Every Other Day/30 Days Discharge Instructions: May shower and wash wound with dial antibacterial soap and water prior to dressing change. Cleanser: Byram Ancillary Kit - 15 Day Supply (DME) (Generic) Every Other Day/30 Days Discharge Instructions: Use supplies as instructed; Kit contains: (15) Saline Bullets; (15) 3x3 Gauze; 15 pr Gloves Prim Dressing: Promogran Prisma Matrix, 4.34 (sq in) (silver collagen) (DME) (Generic) Every Other Day/30 Days ary Discharge Instructions: Moisten collagen with saline or hydrogel Secondary Dressing: Woven Gauze Sponges 2x2 in (DME) (Generic) Every Other Day/30 Days Discharge Instructions: Apply over primary dressing as directed. Secured With: Child psychotherapist, Sterile 2x75 (in/in) (DME) (Generic) Every Other Day/30 Days Discharge Instructions: Secure with stretch gauze as directed. Secured With: 35M Medipore H Soft Cloth Surgical Tape, 2x2 (in/yd) (DME) (Generic) Every Other Day/30 Days Discharge Instructions:  Secure dressing with tape as directed. Wound #2 - Foot Wound Laterality: Plantar, Right Cleanser: Soap and Water Every Other Day/30 Days Discharge Instructions: May shower and wash wound with dial antibacterial soap and water prior to dressing change. Cleanser: Byram Ancillary Kit - 15 Day Supply (DME) (Generic) Every Other Day/30 Days Discharge Instructions: Use supplies as instructed; Kit contains: (15) Saline Bullets; (15) 3x3 Gauze; 15 pr Gloves Prim Dressing: Promogran Prisma Matrix, 4.34 (sq in) (silver collagen) (DME) (Generic) Every Other Day/30 Days ary Discharge Instructions: Moisten collagen with saline or hydrogel Secondary Dressing: Woven Gauze Sponges 2x2 in (DME) (Generic) Every Other Day/30 Days Discharge Instructions: Apply over primary dressing as directed. Secured With: Child psychotherapist, Sterile 2x75 (in/in) (DME) (Generic) Every Other Day/30 Days Discharge Instructions: Secure with stretch gauze as directed. Secured With: 35M Medipore H Soft Cloth Surgical Tape, 2x2 (in/yd) (DME) (Generic) Every Other Day/30 Days Discharge Instructions: Secure dressing with tape as directed. Patient Medications llergies: penicillin A Notifications Medication Indication Start End lidocaine DOSE topical 4 % gel - gel topical applied only for debridements by MD. Electronic Signature(s) Signed: 10/27/2020 5:09:37 PM By: Linton Ham MD Signed: 10/27/2020 5:39:27 PM By: Deon Pilling Entered By: Deon Pilling on 10/27/2020 15:56:58 Prescription 10/27/2020 -------------------------------------------------------------------------------- Matthew Saras MD Patient Name: Provider: [birth year] [id number] Date of Birth: NPI#: Male [id number] Sex: DEA #: [phone] [id number] Phone #: License #: [place] Patient Address: [address] [place], [place] [address] [place], [place]  [address] [phone] Allergies penicillin Medication Medication: Route: Strength: Form: lidocaine topical 4% gel Class: TOPICAL LOCAL ANESTHETICS Dose: Frequency / Time: Indication: gel topical applied only for debridements by MD. Number of Refills: Number of Units: 0 Generic Substitution: Start Date: End Date: Administered at Facility: Substitution Permitted Yes Time Administered: Time Discontinued: Note to Pharmacy: Hand Signature: Date(s): Electronic Signature(s) Signed: 10/27/2020 5:09:37 PM By: Linton Ham MD Signed: 10/27/2020 5:39:27 PM By: Deon Pilling Entered By: Deon Pilling on 10/27/2020 15:56:58 -------------------------------------------------------------------------------- Problem List Details Patient Name: Date of Service: Dustin Baker, [name]. 10/27/2020 2:45 PM Medical Record Number: [id number] Patient Account Number: [id number] Date of Birth/Sex: Treating RN: September 12, 1970 (50 y.o. Male) Deon Pilling Primary Care Provider: Leighton Ruff Other Clinician: Referring Provider: Treating Provider/Extender: [name] in Treatment: 0 Active Problems ICD-10 Encounter Code Description Active Date MDM Diagnosis E11.621 Type 2 diabetes mellitus with foot ulcer 10/27/2020 No Yes L97.512 Non-pressure chronic ulcer of other part of right foot with fat layer exposed 10/27/2020 No Yes E11.42 Type 2 diabetes mellitus with diabetic polyneuropathy 10/27/2020  No Yes Inactive Problems Resolved Problems Electronic Signature(s) Signed: 10/27/2020 5:09:37 PM By: Linton Ham MD Entered By: Linton Ham on 10/27/2020 16:06:23 -------------------------------------------------------------------------------- Progress Note Details Patient Name: Date of Service: Dustin Baker, Dustin Baker. 10/27/2020 2:45 PM Medical Record Number: [id number] Patient Account Number: [id number] Date of Birth/Sex: Treating RN: [birth year] (50 y.o. 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 He is here for review of 2 wounds on his plantar feet the first is 1 over the fourth metatarsal head that is been there for about 6 months. He has no idea how this happened and simply saw blood on his sock. He was not doing anything specific to this except putting a dry dressing on the top. 2 weeks ago we will noted another 1 more inferiorly on the lateral plantar foot. Did not seek any medical attention until 2 days ago saw his primary doctor a culture was done he was prescribed Bactrim and given Bactroban topically. Past medical history includes hypercholesterolemia, hypertension, type 2 diabetes with peripheral neuropathy, history of EtOH ABI in our clinic was 0.91 on the right Patient History Information obtained from Patient. Allergies penicillin Social History Never smoker, Marital Status - Single, Alcohol Use - Moderate, Drug Use - No History, Caffeine Use - Daily - Soda. Medical History Cardiovascular Patient has history of Hypertension Endocrine Patient has history of Type II Diabetes Neurologic Patient has history of Neuropathy Patient is treated with Oral Agents. Blood sugar is tested. Review of Systems (ROS) Constitutional Symptoms (General Health) Denies complaints or symptoms of Fatigue, Fever, Chills, Marked Weight Change. Eyes Complains or has symptoms of Vision Changes - blurred vision in left eye. Ear/Nose/Mouth/Throat Denies complaints or symptoms of Chronic sinus problems or rhinitis. Respiratory Denies complaints or  symptoms of Chronic or frequent coughs, Shortness of Breath. Gastrointestinal Denies complaints or symptoms of Frequent diarrhea, Nausea, Vomiting. Genitourinary Denies complaints or symptoms of Frequent urination. Integumentary (Skin) Complains or has symptoms of Wounds - wounds on right foot. Musculoskeletal Denies complaints or symptoms of Muscle Pain, Muscle Weakness. Psychiatric Denies complaints or symptoms of Claustrophobia, Suicidal. Objective Constitutional Patient is hypertensive.. Pulse regular and within target range for patient.Marland Kitchen Respirations regular, non-labored and within target range.. Temperature is normal and within the target range for the patient.Marland Kitchen Appears in no distress. Vitals Time Taken: 2:48 PM, Height: 71 in, Source: Stated, Weight: 224 lbs, Source: Stated, BMI: 31.2, Temperature: 97.8 F, Pulse: 92 bpm, Respiratory Rate: 16 breaths/min, Blood Pressure: 143/92 mmHg, Capillary Blood Glucose: 225 mg/dl. General Notes: glucose per pt report Cardiovascular Pedal pulses palpable. General Notes: Wound exam; the patient had 2 wounds the first over the fourth metatarsal head small wound with some depth it 0.3 cm however there was circumferential undermining I removed thick skin and subcutaneous tissue from around the wound margin and then using a #5 curette removed fibrinous debris from the wound surface hemostasis with direct pressure ooBelow this on the plantar lateral foot a small open area with thick callus. I remove the callus and subcutaneous debris again hemostasis with direct pressure. ooThere is no evidence of infection in either wound area Integumentary (Hair, Skin) Wound #1 status is Open. Original cause  of wound was Gradually Appeared. The date acquired was: 04/03/2020. The wound is located on the Right Metatarsal head fourth. The wound measures 0.8cm length x 0.5cm width x 0.3cm depth; 0.314cm^2 area and 0.094cm^3 volume. There is Fat Layer  (Subcutaneous Tissue) exposed. There is no tunneling noted, however, there is undermining starting at 12:00 and ending at 12:00 with a maximum distance of 0.4cm. There is a medium amount of serosanguineous drainage noted. The wound margin is well defined and not attached to the wound base. There is large (67-100%) pink granulation within the wound bed. There is a small (1-33%) amount of necrotic tissue within the wound bed including Adherent Slough. Wound #2 status is Open. Original cause of wound was Gradually Appeared. The date acquired was: 09/04/2019. The wound is located on the [place]. The wound measures 0.2cm length x 0.2cm width x 0.3cm depth; 0.031cm^2 area and 0.009cm^3 volume. There is Fat Layer (Subcutaneous Tissue) exposed. There is no tunneling or undermining noted. There is a medium amount of serosanguineous drainage noted. The wound margin is thickened. There is large (67- 100%) pink granulation within the wound bed. There is no necrotic tissue within the wound bed. Assessment Active Problems ICD-10 Type 2 diabetes mellitus with foot ulcer Non-pressure chronic ulcer of other part of right foot with fat layer exposed Type 2 diabetes mellitus with diabetic polyneuropathy Procedures Wound #1 Pre-procedure diagnosis of Wound #1 is a Diabetic Wound/Ulcer of the Lower Extremity located on the Right Metatarsal head fourth .Severity of Tissue Pre Debridement is: Fat layer exposed. There was a Selective/Open Wound Skin/Epidermis Debridement with a total area of 1 sq cm performed by Ricard Dillon., MD. With the following instrument(s): Curette to remove Viable and Non-Viable tissue/material. Material removed includes Callus, Skin: Dermis, Skin: Epidermis, and Fibrin/Exudate after achieving pain control using Lidocaine 4% Topical Solution. A time out was conducted at 15:45, prior to the start of the procedure. A Minimum amount of bleeding was controlled with Pressure. The  procedure was tolerated well with a pain level of 0 throughout and a pain level of 0 following the procedure. Post Debridement Measurements: 0.8cm length x 0.5cm width x 0.3cm depth; 0.094cm^3 volume. Character of Wound/Ulcer Post Debridement requires further debridement. Severity of Tissue Post Debridement is: Fat layer exposed. Post procedure Diagnosis Wound #1: Same as Pre-Procedure Wound #2 Pre-procedure diagnosis of Wound #2 is a Diabetic Wound/Ulcer of the Lower Extremity located on the Right,Plantar Foot .Severity of Tissue Pre Debridement is: Fat layer exposed. There was a Selective/Open Wound Skin/Epidermis Debridement with a total area of 0.25 sq cm performed by Ricard Dillon., MD. With the following instrument(s): Curette to remove Viable and Non-Viable tissue/material. Material removed includes Callus, Skin: Dermis, Skin: Epidermis, and Fibrin/Exudate after achieving pain control using Lidocaine 4% Topical Solution. A time out was conducted at 15:45, prior to the start of the procedure. A Minimum amount of bleeding was controlled with Pressure. The procedure was tolerated well with a pain level of 0 throughout and a pain level of 0 following the procedure. Post Debridement Measurements: 0.2cm length x 0.2cm width x 0.3cm depth; 0.009cm^3 volume. Character of Wound/Ulcer Post Debridement requires further debridement. Severity of Tissue Post Debridement is: Fat layer exposed. Post procedure Diagnosis Wound #2: Same as Pre-Procedure Plan Follow-up Appointments: Return Appointment in 1 week. Bathing/ Shower/ Hygiene: May shower with protection but do not get wound dressing(s) wet. - when not changing the dressing. May shower and wash wound with soap and water. -  with dressing changes. Edema Control - Lymphedema / SCD / Other: Elevate legs to the level of the heart or above for 30 minutes daily and/or when sitting, a frequency of: - 3-4 times day throughout the day. Avoid standing for  long periods of time. Moisturize legs daily. - both leg every night before bed. Off-Loading: Wedge shoe to: - office to provide to patient today for right foot. patient to wear while walking and standing. only wear regular shoe while driving. Additional Orders / Instructions: Other: - check feet every night before bed. The following medication(s) was prescribed: lidocaine topical 4 % gel gel topical applied only for debridements by MD. was prescribed at facility WOUND #1: - Metatarsal head fourth Wound Laterality: Right Cleanser: Soap and Water Every Other Day/30 Days Discharge Instructions: May shower and wash wound with dial antibacterial soap and water prior to dressing change. Cleanser: Byram Ancillary Kit - 15 Day Supply (DME) (Generic) Every Other Day/30 Days Discharge Instructions: Use supplies as instructed; Kit contains: (15) Saline Bullets; (15) 3x3 Gauze; 15 pr Gloves Prim Dressing: Promogran Prisma Matrix, 4.34 (sq in) (silver collagen) (DME) (Generic) Every Other Day/30 Days ary Discharge Instructions: Moisten collagen with saline or hydrogel Secondary Dressing: Woven Gauze Sponges 2x2 in (DME) (Generic) Every Other Day/30 Days Discharge Instructions: Apply over primary dressing as directed. Secured With: Child psychotherapist, Sterile 2x75 (in/in) (DME) (Generic) Every Other Day/30 Days Discharge Instructions: Secure with stretch gauze as directed. Secured With: 60M Medipore H Soft Cloth Surgical Baker ape, 2x2 (in/yd) (DME) (Generic) Every Other Day/30 Days Discharge Instructions: Secure dressing with tape as directed. WOUND #2: - Foot Wound Laterality: Plantar, Right Cleanser: Soap and Water Every Other Day/30 Days Discharge Instructions: May shower and wash wound with dial antibacterial soap and water prior to dressing change. Cleanser: Byram Ancillary Kit - 15 Day Supply (DME) (Generic) Every Other Day/30 Days Discharge Instructions: Use supplies as instructed; Kit  contains: (15) Saline Bullets; (15) 3x3 Gauze; 15 pr Gloves Prim Dressing: Promogran Prisma Matrix, 4.34 (sq in) (silver collagen) (DME) (Generic) Every Other Day/30 Days ary Discharge Instructions: Moisten collagen with saline or hydrogel Secondary Dressing: Woven Gauze Sponges 2x2 in (DME) (Generic) Every Other Day/30 Days Discharge Instructions: Apply over primary dressing as directed. Secured With: Child psychotherapist, Sterile 2x75 (in/in) (DME) (Generic) Every Other Day/30 Days Discharge Instructions: Secure with stretch gauze as directed. Secured With: 60M Medipore H Soft Cloth Surgical Baker ape, 2x2 (in/yd) (DME) (Generic) Every Other Day/30 Days Discharge Instructions: Secure dressing with tape as directed. 1. We will use silver collagen to these wounds change every second day border foam 2. Gave him a forefoot offloading shoe. 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 I did not see any evidence of infection but I did not alter the antibiotics he is on Bactrim. 4. Await culture that it was done at his primary doctor's office 2 days ago Electronic Signature(s) Signed: 10/27/2020 5:09:37 PM By: Linton Ham MD Entered By: Linton Ham on 10/27/2020 16:13:49 -------------------------------------------------------------------------------- HxROS Details Patient Name: Date of Service: Dustin Baker, Dustin Baker. 10/27/2020 2:45 PM Medical Record Number: [id number] Patient Account Number: [id number] Date of Birth/Sex: Treating RN: Jun 27, 1971 (50 y.o. Male) Levan Hurst Primary Care Provider: Leighton Ruff Other Clinician: Referring Provider: Treating Provider/Extender: [name] in Treatment: 0 Information Obtained From Patient Constitutional Symptoms (General Health) Complaints and Symptoms: Negative for:  Fatigue; Fever; Chills; Marked Weight Change Eyes Complaints and  Symptoms: Positive for: Vision Changes - blurred vision in left eye Ear/Nose/Mouth/Throat Complaints and Symptoms: Negative for: Chronic sinus problems or rhinitis Respiratory Complaints and Symptoms: Negative for: Chronic or frequent coughs; Shortness of Breath Gastrointestinal Complaints and Symptoms: Negative for: Frequent diarrhea; Nausea; Vomiting Genitourinary Complaints and Symptoms: Negative for: Frequent urination Integumentary (Skin) Complaints and Symptoms: Positive for: Wounds - wounds on right foot Musculoskeletal Complaints and Symptoms: Negative for: Muscle Pain; Muscle Weakness Psychiatric Complaints and Symptoms: Negative for: Claustrophobia; Suicidal Hematologic/Lymphatic Cardiovascular Medical History: Positive for: Hypertension Endocrine Medical History: Positive for: Type II Diabetes Time with diabetes: 14 years Treated with: Oral agents Blood sugar tested every day: Yes Tested : once a day Immunological Neurologic Medical History: Positive for: Neuropathy Oncologic Immunizations Pneumococcal Vaccine: Received Pneumococcal Vaccination: No Implantable Devices None Family and Social History Never smoker; Marital Status - Single; Alcohol Use: Moderate; Drug Use: No History; Caffeine Use: Daily - Soda; Financial Concerns: No; Food, Clothing or Shelter Needs: No; Support System Lacking: No; Transportation Concerns: No Engineer, maintenance) Signed: 10/27/2020 5:09:37 PM By: Linton Ham MD Signed: 10/31/2020 5:56:44 PM By: Levan Hurst RN, BSN Entered By: Levan Hurst on 10/27/2020 15:07:54 -------------------------------------------------------------------------------- [place] Details Patient Name: Date of Service: Dustin Baker, [name]. 10/27/2020 Medical Record Number: [id number] Patient Account Number: [id number] Date of Birth/Sex: Treating RN: 1971/07/18 (50 y.o. 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 Facility Procedures CPT4 Code: [id number] Description: 99213 - WOUND CARE VISIT-LEV 3 EST PT Modifier: Quantity: 1 CPT4 Code: 95284132 Description: 44010 - DEBRIDE WOUND 1ST 20 SQ CM OR < ICD-10 Diagnosis Description L97.512 Non-pressure chronic ulcer of other part of right foot with fat layer exposed E11.621 Type 2 diabetes mellitus with foot ulcer Modifier: Quantity: 1 Physician Procedures : CPT4 Code Description Modifier [id number] WC PHYS LEVEL 3 NEW PT 25 ICD-10 Diagnosis Description E11.42 Type 2 diabetes mellitus with diabetic polyneuropathy E11.621 Type 2 diabetes mellitus with foot ulcer L97.512 Non-pressure chronic ulcer of other part  of right foot with fat layer exposed Quantity: 1 : [id number] 25956 - WC PHYS DEBR WO ANESTH 20 SQ CM ICD-10 Diagnosis Description L97.512 Non-pressure chronic ulcer of other part of right foot with fat layer exposed E11.621 Type 2 diabetes mellitus with foot ulcer Quantity: 1 Electronic Signature(s) Signed: 10/27/2020 5:39:27 PM By: Deon Pilling Signed: 10/28/2020 7:50:42 AM By: Linton Ham MD Previous Signature: 10/27/2020 5:09:37 PM Version By: Linton Ham MD Entered By: Deon Pilling on 10/27/2020 17:21:59

## 2020-10-31 NOTE — Progress Notes (Signed)
Dustin Baker, Dustin Baker ([id number]) . Visit Report for 10/27/2020 Abuse/Suicide Risk Screen Details Patient Name: Date of Service: Dustin Baker. 10/27/2020 2:45 PM Medical Record Number: [id number] Patient Account Number: [id number] Date of Birth/Sex: Treating RN: [birth year] (50 y.o. Male) Zandra Abts Primary Care Provider: [name] Other Clinician: Referring Provider: Treating Provider/Extender: Arelia Longest in Treatment: 0 Abuse/Suicide Risk Screen Items Answer ABUSE RISK SCREEN: Has anyone close to you tried to hurt or harm you recentlyo No Do you feel uncomfortable with anyone in your familyo No Has anyone forced you do things that you didnt want to doo No Electronic Signature(s) Signed: 10/31/2020 5:56:44 PM By: Zandra Abts RN, BSN Entered By: Zandra Abts on 10/27/2020 15:08:00 -------------------------------------------------------------------------------- Activities of Daily Living Details Patient Name: Date of Service: Dustin Baker. 10/27/2020 2:45 PM Medical Record Number: [id number] Patient Account Number: [id number] Date of Birth/Sex: Treating RN: 1970-11-10 (50 y.o. Male) Zandra Abts Primary Care Provider: [name] Other Clinician: Referring Provider: Treating Provider/Extender: Arelia Longest in Treatment: 0 Activities of Daily Living Items Answer Activities of Daily Living (Please select one for each item) Drive Automobile Completely Able Baker Medications ake Completely Able Use Baker elephone Completely Able Care for Appearance Completely Able Use Baker oilet Completely Able Bath / Shower Completely Able Dress Self Completely Able Feed Self Completely Able Walk Completely Able Get In / Out Bed Completely Able Housework Completely Able Prepare Meals Completely Able Handle Money Completely Able Shop for Self Completely Able Electronic Signature(s) Signed: 10/31/2020 5:56:44 PM By: Zandra Abts RN, BSN Entered By: Zandra Abts on 10/27/2020 15:08:14 -------------------------------------------------------------------------------- Education Screening Details Patient Name: Date of Service: Dustin Baker. 10/27/2020 2:45 PM Medical Record Number: [id number] Patient Account Number: [id number] Date of Birth/Sex: Treating RN: September 27, 1970 (50 y.o. Male) Zandra Abts Primary Care Provider: [name] Other Clinician: Referring Provider: Treating Provider/Extender: Arelia Longest in Treatment: 0 Primary Learner Assessed: Patient Learning Preferences/Education Level/Primary Language Learning Preference: Explanation, Demonstration, Printed Material Highest Education Level: College or Above Preferred Language: English Cognitive Barrier Language Barrier: No Translator Needed: No Memory Deficit: No Emotional Barrier: No Cultural/Religious Beliefs Affecting Medical Care: No Physical Barrier Impaired Vision: No Impaired Hearing: No Decreased Hand dexterity: No Knowledge/Comprehension Knowledge Level: High Comprehension Level: High Ability to understand written instructions: High Ability to understand verbal instructions: High Motivation Anxiety Level: Calm Cooperation: Cooperative Education Importance: Acknowledges Need Interest in Health Problems: Asks Questions Perception: Coherent Willingness to Engage in Self-Management High Activities: Readiness to Engage in Self-Management High Activities: Electronic Signature(s) Signed: 10/31/2020 5:56:44 PM By: Zandra Abts RN, BSN Entered By: Zandra Abts on 10/27/2020 15:08:31 -------------------------------------------------------------------------------- Fall Risk Assessment Details Patient Name: Date of Service: Dustin Baker. 10/27/2020 2:45 PM Medical Record Number: [id number] Patient Account Number: [id number] Date of Birth/Sex: Treating RN: 1971-08-16 (50 y.o. Male)  Zandra Abts Primary Care Provider: [name] Other Clinician: Referring Provider: Treating Provider/Extender: Arelia Longest in Treatment: 0 Fall Risk Assessment Items Have you had 2 or more falls in the last 12 monthso 0 No Have you had any fall that resulted in injury in the last 12 monthso 0 No FALLS RISK SCREEN History of falling - immediate or within 3 months 0 No Secondary diagnosis (Do you have 2 or more medical diagnoseso) 15 Yes Ambulatory aid None/bed rest/wheelchair/nurse 0 Yes Crutches/cane/walker 0 No Furniture 0 No Intravenous therapy Access/Saline/Heparin Lock 0 No Gait/Transferring Normal/ bed rest/ wheelchair  0 Yes Weak (short steps with or without shuffle, stooped but able to lift head while walking, may seek 0 No support from furniture) Impaired (short steps with shuffle, may have difficulty arising from chair, head down, impaired 0 No balance) Mental Status Oriented to own ability 0 Yes Electronic Signature(s) Signed: 10/31/2020 5:56:44 PM By: Zandra Abts RN, BSN Entered By: Zandra Abts on 10/27/2020 15:08:53 -------------------------------------------------------------------------------- Foot Assessment Details Patient Name: Date of Service: Dustin Baker. 10/27/2020 2:45 PM Medical Record Number: [id number] Patient Account Number: [id number] Date of Birth/Sex: Treating RN: Jul 03, 1971 (50 y.o. Male) Zandra Abts Primary Care Provider: [name] Other Clinician: Referring Provider: Treating Provider/Extender: Arelia Longest in Treatment: 0 Foot Assessment Items Site Locations + = Sensation present, - = Sensation absent, C = Callus, U = Ulcer R = Redness, W = Warmth, M = Maceration, PU = Pre-ulcerative lesion F = Fissure, S = Swelling, D = Dryness Assessment Right: Left: Other Deformity: No No Prior Foot Ulcer: No No Prior Amputation: No No Charcot Joint: No  No Ambulatory Status: Ambulatory Without Help Gait: Steady Electronic Signature(s) Signed: 10/31/2020 5:56:44 PM By: Zandra Abts RN, BSN Entered By: Zandra Abts on 10/27/2020 15:10:04 -------------------------------------------------------------------------------- Nutrition Risk Screening Details Patient Name: Date of Service: Dustin Baker. 10/27/2020 2:45 PM Medical Record Number: [id number] Patient Account Number: [id number] Date of Birth/Sex: Treating RN: 12/18/1970 (50 y.o. Male) Zandra Abts Primary Care Provider: [name] Other Clinician: Referring Provider: Treating Provider/Extender: Arelia Longest in Treatment: 0 Height (in): 71 Weight (lbs): 224 Body Mass Index (BMI): 31.2 Nutrition Risk Screening Items Score Screening NUTRITION RISK SCREEN: I have an illness or condition that made me change the kind and/or amount of food I eat 2 Yes I eat fewer than two meals per day 0 No I eat few fruits and vegetables, or milk products 0 No I have three or more drinks of beer, liquor or wine almost every day 0 No I have tooth or mouth problems that make it hard for me to eat 0 No I don'Baker always have enough money to buy the food I need 0 No I eat alone most of the time 0 No I take three or more different prescribed or over-the-counter drugs a day 1 Yes Without wanting to, I have lost or gained 10 pounds in the last six months 0 No I am not always physically able to shop, cook and/or feed myself 0 No Nutrition Protocols Good Risk Protocol Moderate Risk Protocol 0 Provide education on nutrition High Risk Proctocol Risk Level: Moderate Risk Score: 3 Electronic Signature(s) Signed: 10/31/2020 5:56:44 PM By: Zandra Abts RN, BSN Entered By: Zandra Abts on 10/27/2020 15:09:10

## 2020-10-31 NOTE — Progress Notes (Signed)
BARRON, VANLOAN T ([id number]) . Visit Report for 10/27/2020 Allergy List Details Patient Name: Date of Service: [facility], [name] T. 10/27/2020 2:45 PM Medical Record Number: [id number] Patient Account Number: [id number] Date of Birth/Sex: Treating RN: Apr 29, 1971 (50 y.o. Male) Levan Hurst Primary Care Provider: Leighton Ruff Other Clinician: Referring Provider: Treating Provider/Extender: [name] in Treatment: 0 Allergies Active Allergies penicillin Allergy Notes Electronic Signature(s) Signed: 10/31/2020 5:56:44 PM By: Levan Hurst RN, BSN Entered By: Levan Hurst on 10/27/2020 14:49:47 -------------------------------------------------------------------------------- Arrival Information Details Patient Name: Date of Service: Dustin Baker, Dustin HN T. 10/27/2020 2:45 PM Medical Record Number: [id number] Patient Account Number: [id number] Date of Birth/Sex: Treating RN: 29-Aug-1971 (50 y.o. Male) Levan Hurst Primary Care Provider: Leighton Ruff Other Clinician: Referring Provider: Treating Provider/Extender: [name] in Treatment: 0 Visit Information Patient Arrived: Ambulatory Arrival Time: 14:47 Accompanied By: alone Transfer Assistance: None Patient Identification Verified: Yes Secondary Verification Process Completed: Yes Patient Requires Transmission-Based Precautions: No Patient Has Alerts: No Electronic Signature(s) Signed: 10/31/2020 5:56:44 PM By: Levan Hurst RN, BSN Entered By: Levan Hurst on 10/27/2020 14:47:46 -------------------------------------------------------------------------------- Clinic Level of Care Assessment Details Patient Name: Date of Service: [name], Dustin HN T. 10/27/2020 2:45 PM Medical Record Number: [id number] Patient Account Number: [id number] Date of Birth/Sex: Treating RN: Jun 15, 1971 (50 y.o. Male) Deon Pilling Primary Care Provider: Leighton Ruff Other  Clinician: Referring Provider: Treating Provider/Extender: [name] in Treatment: 0 Clinic Level of Care Assessment Items TOOL 1 Quantity Score X- 1 0 Use when EandM and Procedure is performed on INITIAL visit ASSESSMENTS - Nursing Assessment / Reassessment X- 1 20 General Physical Exam (combine w/ comprehensive assessment (listed just below) when performed on new pt. evals) X- 1 25 Comprehensive Assessment (HX, ROS, Risk Assessments, Wounds Hx, etc.) ASSESSMENTS - Wound and Skin Assessment / Reassessment X- 1 10 Dermatologic / Skin Assessment (not related to wound area) ASSESSMENTS - Ostomy and/or Continence Assessment and Care []  - 0 Incontinence Assessment and Management []  - 0 Ostomy Care Assessment and Management (repouching, etc.) PROCESS - Coordination of Care []  - 0 Simple Patient / Family Education for ongoing care X- 1 20 Complex (extensive) Patient / Family Education for ongoing care X- 1 10 Staff obtains Programmer, systems, Records, T Results / Process Orders est []  - 0 Staff telephones HHA, Nursing Homes / Clarify orders / etc []  - 0 Routine Transfer to another Facility (non-emergent condition) []  - 0 Routine Hospital Admission (non-emergent condition) X- 1 15 New Admissions / Biomedical engineer / Ordering NPWT Apligraf, etc. , []  - 0 Emergency Hospital Admission (emergent condition) PROCESS - Special Needs []  - 0 Pediatric / Minor Patient Management []  - 0 Isolation Patient Management []  - 0 Hearing / Language / Visual special needs []  - 0 Assessment of Community assistance (transportation, D/C planning, etc.) []  - 0 Additional assistance / Altered mentation []  - 0 Support Surface(s) Assessment (bed, cushion, seat, etc.) INTERVENTIONS - Miscellaneous []  - 0 External ear exam []  - 0 Patient Transfer (multiple staff / Civil Service fast streamer / Similar devices) []  - 0 Simple Staple / Suture removal (25 or less) []  - 0 Complex  Staple / Suture removal (26 or more) []  - 0 Hypo/Hyperglycemic Management (do not check if billed separately) X- 1 15 Ankle / Brachial Index (ABI) - do not check if billed separately Has the patient been seen at the hospital within the last three years: Yes Total Score: 115 Level Of Care: New/Established -  Level 3 Electronic Signature(s) Signed: 10/27/2020 5:39:27 PM By: Deon Pilling Entered By: Deon Pilling on 10/27/2020 17:21:51 -------------------------------------------------------------------------------- Encounter Discharge Information Details Patient Name: Date of Service: Dustin Baker, Dustin HN T. 10/27/2020 2:45 PM Medical Record Number: [id number] Patient Account Number: [id number] Date of Birth/Sex: Treating RN: Apr 26, 1971 (50 y.o. Male) Baruch Gouty Primary Care Provider: Leighton Ruff Other Clinician: Referring Provider: Treating Provider/Extender: [name] in Treatment: 0 Encounter Discharge Information Items Post Procedure Vitals Discharge Condition: Stable Temperature (F): 97.8 Ambulatory Status: Ambulatory Pulse (bpm): 92 Discharge Destination: Home Respiratory Rate (breaths/min): 18 Transportation: Private Auto Blood Pressure (mmHg): 143/92 Accompanied By: self Schedule Follow-up Appointment: Yes Clinical Summary of Care: Patient Declined Electronic Signature(s) Signed: 10/27/2020 5:40:23 PM By: Baruch Gouty RN, BSN Entered By: Baruch Gouty on 10/27/2020 16:22:21 -------------------------------------------------------------------------------- Lower Extremity Assessment Details Patient Name: Date of Service: [name], Dustin Baker T. 10/27/2020 2:45 PM Medical Record Number: [id number] Patient Account Number: [id number] Date of Birth/Sex: Treating RN: 1971-01-02 (50 y.o. Male) Levan Hurst Primary Care Provider: Leighton Ruff Other Clinician: Referring Provider: Treating Provider/Extender: [name] in Treatment: 0 Edema Assessment Assessed: [name]: No] [Right: No] Edema: [Left: N] [Right: o] Calf Left: Right: Point of Measurement: 34 cm From Medial Instep 37.5 cm Ankle Left: Right: Point of Measurement: 13 cm From Medial Instep 21.8 cm Vascular Assessment Pulses: Dorsalis Pedis Palpable: [Right:Yes] Blood Pressure: Brachial: [Right:143] Ankle: [Right:Dorsalis Pedis: 130 0.91] Electronic Signature(s) Signed: 10/31/2020 5:56:44 PM By: Levan Hurst RN, BSN Entered By: Levan Hurst on 10/27/2020 15:10:08 -------------------------------------------------------------------------------- Multi Wound Chart Details Patient Name: Date of Service: Dustin Baker, Dustin Baker T. 10/27/2020 2:45 PM Medical Record Number: [id number] Patient Account Number: [id number] Date of Birth/Sex: Treating RN: 1971-07-22 (50 y.o. Male) Deon Pilling Primary Care Provider: Leighton Ruff Other Clinician: Referring Provider: Treating Provider/Extender: [name] in Treatment: 0 Vital Signs Height(in): 71 Capillary Blood Glucose(mg/dl): 225 Weight(lbs): 224 Pulse(bpm): 92 Body Mass Index(BMI): 31 Blood Pressure(mmHg): 143/92 Temperature(F): 97.8 Respiratory Rate(breaths/min): 16 Photos: [1:No Photos Right Metatarsal head fourth] [2:No Photos Right, Plantar Foot] [N/A:N/A N/A] Wound Location: [1:Gradually Appeared] [2:Gradually Appeared] [N/A:N/A] Wounding Event: [1:Diabetic Wound/Ulcer of the Lower] [2:Diabetic Wound/Ulcer of the Lower] [N/A:N/A] Primary Etiology: [1:Extremity Hypertension, Type II Diabetes,] [2:Extremity Hypertension, Type II Diabetes,] [N/A:N/A] Comorbid History: [1:Neuropathy 04/03/2020] [2:Neuropathy 09/04/2019] [N/A:N/A] Date Acquired: [1:0] [2:0] [N/A:N/A] Weeks of Treatment: [1:Open] [2:Open] [N/A:N/A] Wound Status: [1:0.8x0.5x0.3] [2:0.2x0.2x0.3] [N/A:N/A] Measurements L x W x D (cm) [1:0.314] [2:0.031] [N/A:N/A] A (cm) : rea  [1:0.094] [2:0.009] [N/A:N/A] Volume (cm) : [1:12] Starting Position 1 (o'clock): [1:12] Ending Position 1 (o'clock): [1:0.4] Maximum Distance 1 (cm): [1:Yes] [2:No] [N/A:N/A] Undermining: [1:Grade 2] [2:Grade 2] [N/A:N/A] Classification: [1:Medium] [2:Medium] [N/A:N/A] Exudate A mount: [1:Serosanguineous] [2:Serosanguineous] [N/A:N/A] Exudate Type: [1:red, brown] [2:red, brown] [N/A:N/A] Exudate Color: [1:Well defined, not attached] [2:Thickened] [N/A:N/A] Wound Margin: [1:Large (67-100%)] [2:Large (67-100%)] [N/A:N/A] Granulation A mount: [1:Pink] [2:Pink] [N/A:N/A] Granulation Quality: [1:Small (1-33%)] [2:None Present (0%)] [N/A:N/A] Necrotic A mount: [1:Fat Layer (Subcutaneous Tissue): Yes Fat Layer (Subcutaneous Tissue): Yes N/A] Exposed Structures: [1:Fascia: No Tendon: No Muscle: No Joint: No Bone: No None] [2:Fascia: No Tendon: No Muscle: No Joint: No Bone: No None] [N/A:N/A] Epithelialization: [1:Debridement - Selective/Open Wound Debridement - Selective/Open Wound N/A] Debridement: Pre-procedure Verification/Time Out 15:45 [2:15:45] [N/A:N/A] Taken: [1:Lidocaine 4% Topical Solution] [2:Lidocaine 4% Topical Solution] [N/A:N/A] Pain Control: [1:Callus] [2:Callus] [N/A:N/A] Tissue Debrided: [1:Skin/Epidermis] [2:Skin/Epidermis] [N/A:N/A] Level: [1:1] [2:0.25] [N/A:N/A] Debridement A (sq cm): [1:rea Curette] [2:Curette] [N/A:N/A] Instrument: [1:Minimum] [2:Minimum] [N/A:N/A]  Bleeding: [1:Pressure] [2:Pressure] [N/A:N/A] Hemostasis A chieved: [1:0] [2:0] [N/A:N/A] Procedural Pain: [1:0] [2:0] [N/A:N/A] Post Procedural Pain: [1:Procedure was tolerated well] [2:Procedure was tolerated well] [N/A:N/A] Debridement Treatment Response: [1:0.8x0.5x0.3] [2:0.2x0.2x0.3] [N/A:N/A] Post Debridement Measurements L x W x D (cm) [1:0.094] [2:0.009] [N/A:N/A] Post Debridement Volume: (cm) [1:Debridement] [2:Debridement] [N/A:N/A] Treatment Notes Electronic Signature(s) Signed: 10/27/2020  5:09:37 PM By: Linton Ham MD Signed: 10/27/2020 5:39:27 PM By: Deon Pilling Entered By: Linton Ham on 10/27/2020 16:06:38 -------------------------------------------------------------------------------- Multi-Disciplinary Care Plan Details Patient Name: Date of Service: Dustin Baker, [place] HN T. 10/27/2020 2:45 PM Medical Record Number: [id number] Patient Account Number: [id number] Date of Birth/Sex: Treating RN: 12/30/1970 (50 y.o. Male) Deon Pilling Primary Care Provider: Leighton Ruff Other Clinician: Referring Provider: Treating Provider/Extender: [name] in Treatment: 0 Active Inactive Nutrition Nursing Diagnoses: Impaired glucose control: actual or potential Goals: Patient/caregiver agrees to and verbalizes understanding of need to obtain nutritional consultation Date Initiated: 10/27/2020 Target Resolution Date: 11/11/2020 Goal Status: Active Patient/caregiver will maintain therapeutic glucose control Date Initiated: 10/27/2020 Target Resolution Date: 11/11/2020 Goal Status: Active Interventions: Assess HgA1c results as ordered upon admission and as needed Provide education on elevated blood sugars and impact on wound healing Provide education on nutrition Treatment Activities: Obtain HgA1c : 10/27/2020 Patient referred to Primary Care Physician for further nutritional evaluation : 10/27/2020 Notes: Orientation to the Wound Care Program Nursing Diagnoses: Knowledge deficit related to the wound healing center program Goals: Patient/caregiver will verbalize understanding of the [facility] Program Date Initiated: 10/27/2020 Target Resolution Date: 11/10/2020 Goal Status: Active Interventions: Provide education on orientation to the wound center Notes: Pain, Acute or Chronic Nursing Diagnoses: Pain, acute or chronic: actual or potential Potential alteration in comfort, pain Goals: Patient will verbalize adequate pain  control and receive pain control interventions during procedures as needed Date Initiated: 10/27/2020 Target Resolution Date: 11/10/2020 Goal Status: Active Patient/caregiver will verbalize comfort level met Date Initiated: 10/27/2020 Target Resolution Date: 11/10/2020 Goal Status: Active Interventions: Encourage patient to take pain medications as prescribed Provide education on pain management Reposition patient for comfort Treatment Activities: Administer pain control measures as ordered : 10/27/2020 Notes: Wound/Skin Impairment Nursing Diagnoses: Knowledge deficit related to ulceration/compromised skin integrity Goals: Patient/caregiver will verbalize understanding of skin care regimen Date Initiated: 10/27/2020 Target Resolution Date: 11/10/2020 Goal Status: Active Interventions: Assess patient/caregiver ability to obtain necessary supplies Assess patient/caregiver ability to perform ulcer/skin care regimen upon admission and as needed Provide education on ulcer and skin care Treatment Activities: Skin care regimen initiated : 10/27/2020 Topical wound management initiated : 10/27/2020 Notes: Electronic Signature(s) Signed: 10/27/2020 5:39:27 PM By: Deon Pilling Entered By: Deon Pilling on 10/27/2020 15:46:34 -------------------------------------------------------------------------------- Pain Assessment Details Patient Name: Date of Service: Dustin Baker, Dustin Baker T. 10/27/2020 2:45 PM Medical Record Number: [id number] Patient Account Number: [id number] Date of Birth/Sex: Treating RN: [birth year] (50 y.o. Male) Levan Hurst Primary Care Provider: Leighton Ruff Other Clinician: Referring Provider: Treating Provider/Extender: [name] in Treatment: 0 Active Problems Location of Pain Severity and Description of Pain Patient Has Paino No Site Locations Pain Management and Medication Current Pain Management: Electronic Signature(s) Signed:  10/31/2020 5:56:44 PM By: Levan Hurst RN, BSN Entered By: Levan Hurst on 10/27/2020 15:12:24 -------------------------------------------------------------------------------- Patient/Caregiver Education Details Patient Name: Date of Service: Dustin Baker, Remo Lipps 2/24/2022andnbsp2:45 PM Medical Record Number: [id number] Patient Account Number: [id number] Date of Birth/Gender: Treating RN: [birth year] (50 y.o. Male) Deon Pilling Primary Care Physician: Leighton Ruff Other Clinician: Referring Physician: Treating Physician/Extender:  [name] in Treatment: 0 Education Assessment Education Provided To: Patient Education Topics Provided Elevated Blood Sugar/ Impact on Healing: Handouts: Elevated Blood Sugars: How Do They Affect Wound Healing Methods: Explain/Verbal, Printed Responses: Reinforcements needed [place]: o Handouts: Welcome T [facility] o Methods: Explain/Verbal, Printed Responses: Reinforcements needed Wound/Skin Impairment: Handouts: Caring for Your Ulcer, Skin Care Do's and Dont's Methods: Explain/Verbal, Printed Responses: Reinforcements needed Electronic Signature(s) Signed: 10/27/2020 5:39:27 PM By: Deon Pilling Entered By: Deon Pilling on 10/27/2020 15:47:22 -------------------------------------------------------------------------------- Wound Assessment Details Patient Name: Date of Service: Dustin Baker, [place] HN T. 10/27/2020 2:45 PM Medical Record Number: [id number] Patient Account Number: [id number] Date of Birth/Sex: Treating RN: July 04, 1971 (50 y.o. Male) Levan Hurst Primary Care Provider: Leighton Ruff Other Clinician: Referring Provider: Treating Provider/Extender: [name] in Treatment: 0 Wound Status Wound Number: 1 Primary Etiology: Diabetic Wound/Ulcer of the Lower Extremity Wound Location: Right Metatarsal head fourth Wound Status:  Open Wounding Event: Gradually Appeared Comorbid History: Hypertension, Type II Diabetes, Neuropathy Date Acquired: 04/03/2020 Weeks Of Treatment: 0 Clustered Wound: No Photos Wound Measurements Length: (cm) 0.8 Width: (cm) 0.5 Depth: (cm) 0.3 Area: (cm) 0.314 Volume: (cm) 0.094 % Reduction in Area: 0% % Reduction in Volume: 0% Epithelialization: None Tunneling: No Undermining: Yes Starting Position (o'clock): 12 Ending Position (o'clock): 12 Maximum Distance: (cm) 0.4 Wound Description Classification: Grade 2 Wound Margin: Well defined, not attached Exudate Amount: Medium Exudate Type: Serosanguineous Exudate Color: red, brown Foul Odor After Cleansing: No Slough/Fibrino Yes Wound Bed Granulation Amount: Large (67-100%) Exposed Structure Granulation Quality: Pink Fascia Exposed: No Necrotic Amount: Small (1-33%) Fat Layer (Subcutaneous Tissue) Exposed: Yes Necrotic Quality: Adherent Slough Tendon Exposed: No Muscle Exposed: No Joint Exposed: No Bone Exposed: No Treatment Notes Wound #1 (Metatarsal head fourth) Wound Laterality: Right Cleanser Soap and Water Discharge Instruction: May shower and wash wound with dial antibacterial soap and water prior to dressing change. Byram Ancillary Kit - 15 Day Supply Discharge Instruction: Use supplies as instructed; Kit contains: (15) Saline Bullets; (15) 3x3 Gauze; 15 pr Gloves Peri-Wound Care Topical Primary Dressing Promogran Prisma Matrix, 4.34 (sq in) (silver collagen) Discharge Instruction: Moisten collagen with saline or hydrogel Secondary Dressing Woven Gauze Sponges 2x2 in Discharge Instruction: Apply over primary dressing as directed. Secured With Conforming Stretch Gauze Bandage, Sterile 2x75 (in/in) Discharge Instruction: Secure with stretch gauze as directed. 72M Medipore H Soft Cloth Surgical T ape, 2x2 (in/yd) Discharge Instruction: Secure dressing with tape as directed. Compression Wrap Compression  Stockings Add-Ons Electronic Signature(s) Signed: 10/28/2020 4:34:48 PM By: Sandre Kitty Signed: 10/31/2020 5:56:44 PM By: Levan Hurst RN, BSN Entered By: Sandre Kitty on 10/28/2020 16:02:31 -------------------------------------------------------------------------------- Wound Assessment Details Patient Name: Date of Service: Dustin Baker, [place] HN T. 10/27/2020 2:45 PM Medical Record Number: [id number] Patient Account Number: [id number] Date of Birth/Sex: Treating RN: 28-Jan-1971 (50 y.o. Male) Levan Hurst Primary Care Provider: Leighton Ruff Other Clinician: Referring Provider: Treating Provider/Extender: [name] in Treatment: 0 Wound Status Wound Number: 2 Primary Etiology: Diabetic Wound/Ulcer of the Lower Extremity Wound Location: Right, Plantar Foot Wound Status: Open Wounding Event: Gradually Appeared Comorbid History: Hypertension, Type II Diabetes, Neuropathy Date Acquired: 09/04/2019 Weeks Of Treatment: 0 Clustered Wound: No Photos Wound Measurements Length: (cm) 0.2 Width: (cm) 0.2 Depth: (cm) 0.3 Area: (cm) 0.031 Volume: (cm) 0.009 % Reduction in Area: 0% % Reduction in Volume: 0% Epithelialization: None Tunneling: No Undermining: No Wound Description Classification: Grade 2  Wound Margin: Thickened Exudate Amount: Medium Exudate Type: Serosanguineous Exudate Color: red, brown Foul Odor After Cleansing: No Slough/Fibrino No Wound Bed Granulation Amount: Large (67-100%) Exposed Structure Granulation Quality: Pink Fascia Exposed: No Necrotic Amount: None Present (0%) Fat Layer (Subcutaneous Tissue) Exposed: Yes Tendon Exposed: No Muscle Exposed: No Joint Exposed: No Bone Exposed: No Treatment Notes Wound #2 (Foot) Wound Laterality: Plantar, Right Cleanser Soap and Water Discharge Instruction: May shower and wash wound with dial antibacterial soap and water prior to dressing change. Byram Ancillary Kit - 15  Day Supply Discharge Instruction: Use supplies as instructed; Kit contains: (15) Saline Bullets; (15) 3x3 Gauze; 15 pr Gloves Peri-Wound Care Topical Primary Dressing Promogran Prisma Matrix, 4.34 (sq in) (silver collagen) Discharge Instruction: Moisten collagen with saline or hydrogel Secondary Dressing Woven Gauze Sponges 2x2 in Discharge Instruction: Apply over primary dressing as directed. Secured With Conforming Stretch Gauze Bandage, Sterile 2x75 (in/in) Discharge Instruction: Secure with stretch gauze as directed. 14M Medipore H Soft Cloth Surgical T ape, 2x2 (in/yd) Discharge Instruction: Secure dressing with tape as directed. Compression Wrap Compression Stockings Add-Ons Electronic Signature(s) Signed: 10/28/2020 4:34:48 PM By: Sandre Kitty Signed: 10/31/2020 5:56:44 PM By: Levan Hurst RN, BSN Entered By: Sandre Kitty on 10/28/2020 16:02:47 -------------------------------------------------------------------------------- Vitals Details Patient Name: Date of Service: Dustin Baker, Dustin HN T. 10/27/2020 2:45 PM Medical Record Number: [id number] Patient Account Number: [id number] Date of Birth/Sex: Treating RN: 1971-07-20 (50 y.o. Male) Levan Hurst Primary Care Provider: Leighton Ruff Other Clinician: Referring Provider: Treating Provider/Extender: [name] in Treatment: 0 Vital Signs Time Taken: 14:48 Temperature (F): 97.8 Height (in): 71 Pulse (bpm): 92 Source: Stated Respiratory Rate (breaths/min): 16 Weight (lbs): 224 Blood Pressure (mmHg): 143/92 Source: Stated Capillary Blood Glucose (mg/dl): 225 Body Mass Index (BMI): 31.2 Reference Range: 80 - 120 mg / dl Notes glucose per pt report Electronic Signature(s) Signed: 10/31/2020 5:56:44 PM By: Levan Hurst RN, BSN Entered By: Levan Hurst on 10/27/2020 14:48:47

## 2020-11-02 ENCOUNTER — Encounter (HOSPITAL_BASED_OUTPATIENT_CLINIC_OR_DEPARTMENT_OTHER): Payer: 59 | Admitting: Physician Assistant

## 2020-11-09 ENCOUNTER — Encounter (HOSPITAL_BASED_OUTPATIENT_CLINIC_OR_DEPARTMENT_OTHER): Payer: 59 | Admitting: Physician Assistant

## 2020-11-15 ENCOUNTER — Encounter (HOSPITAL_BASED_OUTPATIENT_CLINIC_OR_DEPARTMENT_OTHER): Payer: BLUE CROSS/BLUE SHIELD | Attending: Physician Assistant | Admitting: Internal Medicine

## 2020-11-15 ENCOUNTER — Other Ambulatory Visit: Payer: Self-pay

## 2020-11-15 DIAGNOSIS — L97512 Non-pressure chronic ulcer of other part of right foot with fat layer exposed: Secondary | ICD-10-CM | POA: Insufficient documentation

## 2020-11-15 DIAGNOSIS — L03115 Cellulitis of right lower limb: Secondary | ICD-10-CM | POA: Insufficient documentation

## 2020-11-15 DIAGNOSIS — I10 Essential (primary) hypertension: Secondary | ICD-10-CM | POA: Diagnosis not present

## 2020-11-15 DIAGNOSIS — E1142 Type 2 diabetes mellitus with diabetic polyneuropathy: Secondary | ICD-10-CM | POA: Diagnosis not present

## 2020-11-15 DIAGNOSIS — E11621 Type 2 diabetes mellitus with foot ulcer: Secondary | ICD-10-CM | POA: Diagnosis not present

## 2020-11-16 NOTE — Progress Notes (Signed)
Dustin Baker, Dustin Baker ([id number]) . Visit Report for 11/15/2020 Arrival Information Details Patient Name: Date of Service: Dustin Baker, Dustin [place] Baker. 11/15/2020 3:45 PM Medical Record Number: [id number] Patient Account Number: [id number] Date of Birth/Sex: Treating RN: 27-Jun-1971 (50 y.o. Dustin Baker, Dustin Baker Primary Care Karley Pho: Leighton Ruff Other Clinician: Referring Tambi Thole: Treating Eureka Valdes/Extender: [name] in Treatment: 2 Visit Information History Since Last Visit Added or deleted any medications: No Patient Arrived: Ambulatory Any new allergies or adverse reactions: No Arrival Time: 16:23 Had a fall or experienced change in No Accompanied By: self activities of daily living that may affect Transfer Assistance: None risk of falls: Patient Identification Verified: Yes Signs or symptoms of abuse/neglect since last visito No Secondary Verification Process Completed: Yes Hospitalized since last visit: No Patient Requires Transmission-Based Precautions: No Implantable device outside of the clinic excluding No Patient Has Alerts: No cellular tissue based products placed in the center since last visit: Has Dressing in Place as Prescribed: Yes Pain Present Now: No Electronic Signature(s) Signed: 11/15/2020 5:54:58 PM By: Baruch Gouty RN, BSN Entered By: Baruch Gouty on 11/15/2020 16:24:02 -------------------------------------------------------------------------------- Encounter Discharge Information Details Patient Name: Date of Service: Dustin Baker, Dustin HN Baker. 11/15/2020 3:45 PM Medical Record Number: [id number] Patient Account Number: [id number] Date of Birth/Sex: Treating RN: 1971-06-18 (50 y.o. Dustin Baker, Dustin Baker Primary Care Kashis Penley: Leighton Ruff Other Clinician: Referring Giovani Neumeister: Treating Bethzaida Boord/Extender: [name] in Treatment: 2 Encounter Discharge Information Items Post Procedure Vitals Discharge  Condition: Stable Temperature (F): 98.6 Ambulatory Status: Ambulatory Pulse (bpm): 97 Discharge Destination: Home Respiratory Rate (breaths/min): 18 Transportation: Private Auto Blood Pressure (mmHg): 127/90 Schedule Follow-up Appointment: Yes Clinical Summary of Care: Provided on 11/15/2020 Form Type Recipient Paper Patient Patient Electronic Signature(s) Signed: 11/15/2020 5:45:45 PM By: Lorrin Jackson Entered By: Lorrin Jackson on 11/15/2020 17:32:55 -------------------------------------------------------------------------------- Lower Extremity Assessment Details Patient Name: Date of Service: Dustin Baker, Dustin HN Baker. 11/15/2020 3:45 PM Medical Record Number: [id number] Patient Account Number: [id number] Date of Birth/Sex: Treating RN: 19-Dec-1970 (50 y.o. Dustin Baker Primary Care Ziyan Schoon: Leighton Ruff Other Clinician: Referring Jenae Tomasello: Treating Gita Dilger/Extender: [name] in Treatment: 2 Edema Assessment Assessed: [Left: No] [Right: No] Edema: [Left: N] [Right: o] Calf Left: Right: Point of Measurement: 34 cm From Medial Instep 37.5 cm Ankle Left: Right: Point of Measurement: 13 cm From Medial Instep 21.8 cm Vascular Assessment Pulses: Dorsalis Pedis Palpable: [Right:Yes] Electronic Signature(s) Signed: 11/15/2020 5:54:58 PM By: Baruch Gouty RN, BSN Entered By: Baruch Gouty on 11/15/2020 16:26:16 -------------------------------------------------------------------------------- Multi Wound Chart Details Patient Name: Date of Service: Dustin Baker, Dustin HN Baker. 11/15/2020 3:45 PM Medical Record Number: [id number] Patient Account Number: [id number] Date of Birth/Sex: Treating RN: [birth year] (50 y.o. Dustin Baker, Dustin Baker Primary Care Coriann Brouhard: Leighton Ruff Other Clinician: Referring Glendal Cassaday: Treating Tre Sanker/Extender: [name] in Treatment: 2 Vital Signs Height(in): 71 Capillary Blood  Glucose(mg/dl): 220 Weight(lbs): 224 Pulse(bpm): 97 Body Mass Index(BMI): 31 Blood Pressure(mmHg): 127/90 Temperature(F): 98.6 Respiratory Rate(breaths/min): 18 Photos: [1:Right Metatarsal head fourth] [2:Right, Plantar Foot] [N/A:N/A N/A] Wound Location: [1:Gradually Appeared] [2:Gradually Appeared] [N/A:N/A] Wounding Event: [1:Diabetic Wound/Ulcer of the Lower] [2:Diabetic Wound/Ulcer of the Lower] [N/A:N/A] Primary Etiology: [1:Extremity Hypertension, Type II Diabetes,] [2:Extremity Hypertension, Type II Diabetes,] [N/A:N/A] Comorbid History: [1:Neuropathy 04/03/2020] [2:Neuropathy 09/04/2019] [N/A:N/A] Date Acquired: [1:2] [2:2] [N/A:N/A] Weeks of Treatment: [1:Open] [2:Open] [N/A:N/A] Wound Status: [1:0.3x0.2x0.3] [2:0x0x0] [N/A:N/A] Measurements L x W x D (cm) [1:0.047] [2:0] [N/A:N/A] A (cm) : rea [1:0.014] [2:0] [N/A:N/A] Volume (cm) : [  1:85.00%] [2:100.00%] [N/A:N/A] % Reduction in A [1:rea: 85.10%] [2:100.00%] [N/A:N/A] % Reduction in Volume: [1:12] Starting Position 1 (o'clock): [1:12] Ending Position 1 (o'clock): [1:0.5] Maximum Distance 1 (cm): [1:Yes] [2:No] [N/A:N/A] Undermining: [1:Grade 2] [2:Grade 2] [N/A:N/A] Classification: [1:Small] [2:None Present] [N/A:N/A] Exudate A mount: [1:Serosanguineous] [2:N/A] [N/A:N/A] Exudate Type: [1:red, brown] [2:N/A] [N/A:N/A] Exudate Color: [1:Well defined, not attached] [2:N/A] [N/A:N/A] Wound Margin: [1:Large (67-100%)] [2:None Present (0%)] [N/A:N/A] Granulation A mount: [1:Red] [2:N/A] [N/A:N/A] Granulation Quality: [1:None Present (0%)] [2:None Present (0%)] [N/A:N/A] Necrotic A mount: [1:Fat Layer (Subcutaneous Tissue): Yes Fascia: No] [N/A:N/A] Exposed Structures: [1:Fascia: No Tendon: No Muscle: No Joint: No Bone: No None] [2:Fat Layer (Subcutaneous Tissue): No Tendon: No Muscle: No Joint: No Bone: No Large (67-100%)] [N/A:N/A] Epithelialization: [1:Debridement - Excisional] [2:N/A]  [N/A:N/A] Debridement: Pre-procedure Verification/Time Out 17:16 [2:N/A] [N/A:N/A] Taken: [1:Lidocaine] [2:N/A] [N/A:N/A] Pain Control: [1:Callus, Subcutaneous] [2:N/A] [N/A:N/A] Tissue Debrided: [1:Skin/Subcutaneous Tissue] [2:N/A] [N/A:N/A] Level: [1:0.06] [2:N/A] [N/A:N/A] Debridement A (sq cm): [1:rea Curette] [2:N/A] [N/A:N/A] Instrument: [1:Minimum] [2:N/A] [N/A:N/A] Bleeding: [1:Silver Nitrate] [2:N/A] [N/A:N/A] Hemostasis A chieved: [1:0] [2:N/A] [N/A:N/A] Procedural Pain: [1:0] [2:N/A] [N/A:N/A] Post Procedural Pain: [1:Procedure was tolerated well] [2:N/A] [N/A:N/A] Debridement Treatment Response: [1:0.3x0.2x0.3] [2:N/A] [N/A:N/A] Post Debridement Measurements L x W x D (cm) [1:0.014] [2:N/A] [N/A:N/A] Post Debridement Volume: (cm) [1:N/A] [2:callous] [N/A:N/A] Assessment Notes: [1:Debridement] [2:N/A] [N/A:N/A] Treatment Notes Wound #1 (Metatarsal head fourth) Wound Laterality: Right Cleanser Byram Ancillary Kit - 15 Day Supply Discharge Instruction: Use supplies as instructed; Kit contains: (15) Saline Bullets; (15) 3x3 Gauze; 15 pr Gloves Soap and Water Discharge Instruction: May shower and wash wound with dial antibacterial soap and water prior to dressing change. Peri-Wound Care Topical Primary Dressing IODOFLEX 0.9% Cadexomer Iodine Pad 4x6 cm Discharge Instruction: Apply to wound bed as instructed Secondary Dressing Woven Gauze Sponges 2x2 in Discharge Instruction: Apply over primary dressing as directed. Secured With Conforming Stretch Gauze Bandage, Sterile 2x75 (in/in) Discharge Instruction: Secure with stretch gauze as directed. 36M Medipore H Soft Cloth Surgical Baker ape, 2x2 (in/yd) Discharge Instruction: Secure dressing with tape as directed. Compression Wrap Compression Stockings Add-Ons Wound #2 (Foot) Wound Laterality: Plantar, Right Cleanser Peri-Wound Care Topical Primary Dressing Secondary Dressing Secured With Compression Wrap Compression  Stockings Add-Ons Electronic Signature(s) Signed: 11/15/2020 5:48:16 PM By: Linton Ham MD Signed: 11/16/2020 5:17:38 PM By: Rhae Hammock RN Entered By: Linton Ham on 11/15/2020 17:41:19 -------------------------------------------------------------------------------- Multi-Disciplinary Care Plan Details Patient Name: Date of Service: Dustin Baker, Dustin HN Baker. 11/15/2020 3:45 PM Medical Record Number: [id number] Patient Account Number: [id number] Date of Birth/Sex: Treating RN: 1970/12/11 (50 y.o. Dustin Baker, Dustin Baker Primary Care Zahria Ding: Leighton Ruff Other Clinician: Referring Jahmiyah Dullea: Treating Rilie Glanz/Extender: [name] in Treatment: 2 Active Inactive Nutrition Nursing Diagnoses: Impaired glucose control: actual or potential Goals: Patient/caregiver agrees to and verbalizes understanding of need to obtain nutritional consultation Date Initiated: 10/27/2020 Target Resolution Date: 11/11/2020 Goal Status: Active Patient/caregiver will maintain therapeutic glucose control Date Initiated: 10/27/2020 Target Resolution Date: 11/11/2020 Goal Status: Active Interventions: Assess HgA1c results as ordered upon admission and as needed Provide education on elevated blood sugars and impact on wound healing Provide education on nutrition Treatment Activities: Obtain HgA1c : 10/27/2020 Patient referred to Primary Care Physician for further nutritional evaluation : 10/27/2020 Notes: Orientation to the Wound Care Program Nursing Diagnoses: Knowledge deficit related to the wound healing center program Goals: Patient/caregiver will verbalize understanding of the [place] Date Initiated: 10/27/2020 Target Resolution Date: 11/10/2020 Goal Status: Active Interventions: Provide education on orientation  to the wound center Notes: Pain, Acute or Chronic Nursing Diagnoses: Pain, acute or chronic: actual or potential Potential  alteration in comfort, pain Goals: Patient will verbalize adequate pain control and receive pain control interventions during procedures as needed Date Initiated: 10/27/2020 Target Resolution Date: 11/10/2020 Goal Status: Active Patient/caregiver will verbalize comfort level met Date Initiated: 10/27/2020 Target Resolution Date: 11/10/2020 Goal Status: Active Interventions: Encourage patient to take pain medications as prescribed Provide education on pain management Reposition patient for comfort Treatment Activities: Administer pain control measures as ordered : 10/27/2020 Notes: Wound/Skin Impairment Nursing Diagnoses: Knowledge deficit related to ulceration/compromised skin integrity Goals: Patient/caregiver will verbalize understanding of skin care regimen Date Initiated: 10/27/2020 Target Resolution Date: 11/10/2020 Goal Status: Active Interventions: Assess patient/caregiver ability to obtain necessary supplies Assess patient/caregiver ability to perform ulcer/skin care regimen upon admission and as needed Provide education on ulcer and skin care Treatment Activities: Skin care regimen initiated : 10/27/2020 Topical wound management initiated : 10/27/2020 Notes: Electronic Signature(s) Signed: 11/16/2020 5:17:38 PM By: Rhae Hammock RN Entered By: Rhae Hammock on 11/15/2020 17:12:15 -------------------------------------------------------------------------------- Pain Assessment Details Patient Name: Date of Service: Dustin Baker, Dustin HN Baker. 11/15/2020 3:45 PM Medical Record Number: [id number] Patient Account Number: [id number] Date of Birth/Sex: Treating RN: 01-29-1971 (50 y.o. Dustin Baker Primary Care Jasalyn Frysinger: Leighton Ruff Other Clinician: Referring Evie Croston: Treating Snow Peoples/Extender: [name] in Treatment: 2 Active Problems Location of Pain Severity and Description of Pain Patient Has Paino No Site Locations Rate the  pain. Current Pain Level: 0 Pain Management and Medication Current Pain Management: Electronic Signature(s) Signed: 11/15/2020 5:54:58 PM By: Baruch Gouty RN, BSN Entered By: Baruch Gouty on 11/15/2020 16:25:46 -------------------------------------------------------------------------------- Patient/Caregiver Education Details Patient Name: Date of Service: Dustin Baker, Remo Lipps 3/15/2022andnbsp3:45 PM Medical Record Number: [id number] Patient Account Number: [id number] Date of Birth/Gender: Treating RN: [birth year] (50 y.o. [name] Primary Care Physician: Leighton Ruff Other Clinician: Referring Physician: Treating Physician/Extender: [name] in Treatment: 2 Education Assessment Education Provided To: Patient Education Topics Provided [facility] [place]: o Methods: Explain/Verbal Responses: State content correctly Electronic Signature(s) Signed: 11/16/2020 5:17:38 PM By: Rhae Hammock RN Entered By: Rhae Hammock on 11/15/2020 17:14:04 -------------------------------------------------------------------------------- Wound Assessment Details Patient Name: Date of Service: Dustin Baker, Dustin HN Baker. 11/15/2020 3:45 PM Medical Record Number: [id number] Patient Account Number: [id number] Date of Birth/Sex: Treating RN: Jun 03, 1971 (50 y.o. Dustin Baker Primary Care Kiah Keay: Leighton Ruff Other Clinician: Referring Lilleigh Hechavarria: Treating Ernestina Joe/Extender: [name] in Treatment: 2 Wound Status Wound Number: 1 Primary Etiology: Diabetic Wound/Ulcer of the Lower Extremity Wound Location: Right Metatarsal head fourth Wound Status: Open Wounding Event: Gradually Appeared Comorbid History: Hypertension, Type II Diabetes, Neuropathy Date Acquired: 04/03/2020 Weeks Of Treatment: 2 Clustered Wound: No Photos Wound Measurements Length: (cm) 0.3 Width: (cm) 0.2 Depth: (cm)  0.3 Area: (cm) 0.047 Volume: (cm) 0.014 % Reduction in Area: 85% % Reduction in Volume: 85.1% Epithelialization: None Tunneling: No Undermining: Yes Starting Position (o'clock): 12 Ending Position (o'clock): 12 Maximum Distance: (cm) 0.5 Wound Description Classification: Grade 2 Wound Margin: Well defined, not attached Exudate Amount: Small Exudate Type: Serosanguineous Exudate Color: red, brown Foul Odor After Cleansing: No Slough/Fibrino No Wound Bed Granulation Amount: Large (67-100%) Exposed Structure Granulation Quality: Red Fascia Exposed: No Necrotic Amount: None Present (0%) Fat Layer (Subcutaneous Tissue) Exposed: Yes Tendon Exposed: No Muscle Exposed: No Joint Exposed: No Bone Exposed: No Treatment Notes Wound #1 (Metatarsal head fourth) Wound  Laterality: Right Cleanser Byram Ancillary Kit - 15 Day Supply Discharge Instruction: Use supplies as instructed; Kit contains: (15) Saline Bullets; (15) 3x3 Gauze; 15 pr Gloves Soap and Water Discharge Instruction: May shower and wash wound with dial antibacterial soap and water prior to dressing change. Peri-Wound Care Topical Primary Dressing IODOFLEX 0.9% Cadexomer Iodine Pad 4x6 cm Discharge Instruction: Apply to wound bed as instructed Secondary Dressing Woven Gauze Sponges 2x2 in Discharge Instruction: Apply over primary dressing as directed. Secured With Conforming Stretch Gauze Bandage, Sterile 2x75 (in/in) Discharge Instruction: Secure with stretch gauze as directed. 58M Medipore H Soft Cloth Surgical Baker ape, 2x2 (in/yd) Discharge Instruction: Secure dressing with tape as directed. Compression Wrap Compression Stockings Add-Ons Electronic Signature(s) Signed: 11/15/2020 5:27:17 PM By: Sandre Kitty Signed: 11/15/2020 5:54:58 PM By: Baruch Gouty RN, BSN Entered By: Sandre Kitty on 11/15/2020 17:06:05 -------------------------------------------------------------------------------- Wound  Assessment Details Patient Name: Date of Service: Dustin Baker, Dustin HN Baker. 11/15/2020 3:45 PM Medical Record Number: [id number] Patient Account Number: [id number] Date of Birth/Sex: Treating RN: October 21, 1970 (50 y.o. Dustin Baker Primary Care Lamica Mccart: Leighton Ruff Other Clinician: Referring Christinamarie Tall: Treating Capucine Tryon/Extender: [name] in Treatment: 2 Wound Status Wound Number: 2 Primary Etiology: Diabetic Wound/Ulcer of the Lower Extremity Wound Location: Right, Plantar Foot Wound Status: Open Wounding Event: Gradually Appeared Comorbid History: Hypertension, Type II Diabetes, Neuropathy Date Acquired: 09/04/2019 Weeks Of Treatment: 2 Clustered Wound: No Photos Wound Measurements Length: (cm) Width: (cm) Depth: (cm) Area: (cm) Volume: (cm) Wound Description Classification: Grade 2 Exudate Amount: None Present Foul Odor After Cleansing: Slough/Fibrino 0 % Reduction in Area: 100% 0 % Reduction in Volume: 100% 0 Epithelialization: Large (67-100%) 0 Tunneling: No 0 Undermining: No No No Wound Bed Granulation Amount: None Present (0%) Exposed Structure Necrotic Amount: None Present (0%) Fascia Exposed: No Fat Layer (Subcutaneous Tissue) Exposed: No Tendon Exposed: No Muscle Exposed: No Joint Exposed: No Bone Exposed: No Assessment Notes callous Electronic Signature(s) Signed: 11/15/2020 5:27:17 PM By: Sandre Kitty Signed: 11/15/2020 5:54:58 PM By: Baruch Gouty RN, BSN Entered By: Sandre Kitty on 11/15/2020 17:06:31 -------------------------------------------------------------------------------- [place] Details Patient Name: Date of Service: Dustin Baker, Dustin HN Baker. 11/15/2020 3:45 PM Medical Record Number: [id number] Patient Account Number: [id number] Date of Birth/Sex: Treating RN: 05-05-1971 (50 y.o. Dustin Baker Primary Care Marvette Schamp: Leighton Ruff Other Clinician: Referring Danyael Alipio: Treating  Magdalyn Arenivas/Extender: [name] in Treatment: 2 Vital Signs Time Taken: 16:24 Temperature (F): 98.6 Height (in): 71 Pulse (bpm): 97 Source: Stated Respiratory Rate (breaths/min): 18 Weight (lbs): 224 Blood Pressure (mmHg): 127/90 Source: Stated Capillary Blood Glucose (mg/dl): 220 Body Mass Index (BMI): 31.2 Reference Range: 80 - 120 mg / dl Notes glucose per pt report on Sunday Electronic Signature(s) Signed: 11/15/2020 5:54:58 PM By: Baruch Gouty RN, BSN Entered By: Baruch Gouty on 11/15/2020 16:25:03

## 2020-11-16 NOTE — Progress Notes (Signed)
Dustin Baker, Dustin Baker ([id number]) . Visit Report for 11/15/2020 Debridement Details Patient Name: Date of Service: [facility], Dustin Baker. 11/15/2020 3:45 PM Medical Record Number: [id number] Patient Account Number: [id number] Date of Birth/Sex: Treating RN: 1970-10-04 (50 y.o. [name], [name] Primary Care Provider: Leighton Ruff Other Clinician: Referring Provider: Treating Provider/Extender: [name] in Treatment: 2 Debridement Performed for Assessment: Wound #1 Right Metatarsal head fourth Performed By: Physician Ricard Dillon., MD Debridement Type: Debridement Severity of Tissue Pre Debridement: Fat layer exposed Level of Consciousness (Pre-procedure): Awake and Alert Pre-procedure Verification/Time Out Yes - 17:16 Taken: Start Time: 17:16 Pain Control: Lidocaine Baker Area Debrided (L x W): otal 0.3 (cm) x 0.2 (cm) = 0.06 (cm) Tissue and other material debrided: Viable, Non-Viable, Callus, Subcutaneous, Skin: Dermis , Skin: Epidermis Level: Skin/Subcutaneous Tissue Debridement Description: Excisional Instrument: Curette Bleeding: Minimum Hemostasis Achieved: Silver Nitrate End Time: 17:17 Procedural Pain: 0 Post Procedural Pain: 0 Response to Treatment: Procedure was tolerated well Level of Consciousness (Post- Awake and Alert procedure): Post Debridement Measurements of Total Wound Length: (cm) 0.3 Width: (cm) 0.2 Depth: (cm) 0.3 Volume: (cm) 0.014 Character of Wound/Ulcer Post Debridement: Improved Severity of Tissue Post Debridement: Fat layer exposed Post Procedure Diagnosis Same as Pre-procedure Electronic Signature(s) Signed: 11/15/2020 5:48:16 PM By: Linton Ham MD Signed: 11/16/2020 5:17:38 PM By: Rhae Hammock RN Entered By: Linton Ham on 11/15/2020 17:41:27 -------------------------------------------------------------------------------- HPI Details Patient Name: Date of Service: Dustin Baker, Dustin Baker. 11/15/2020 3:45  PM Medical Record Number: [id number] Patient Account Number: [id number] Date of Birth/Sex: Treating RN: 09-15-1970 (50 y.o. 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 He is here for review of 2 wounds on his plantar feet the first is 1 over the fourth metatarsal head that is been there for about 6 months. He has no idea how this happened and simply saw blood on his sock. He was not doing anything specific to this except putting a dry dressing on the top. 2 weeks ago we will noted another 1 more inferiorly on the lateral plantar foot. Did not seek any medical attention until 2 days ago saw his primary doctor a culture was done he was prescribed Bactrim and given Bactroban topically. Past medical history includes hypercholesterolemia, hypertension, type 2 diabetes with peripheral neuropathy, history of EtOH ABI in our clinic was 0.91 on the right 3/15; this is a 3-week follow-up I am not exactly sure how this happened. This is a patient with a wound on the lateral part of his foot and an area over his fourth metatarsal head. We have been using silver collagen to both of these areas. The area on the lateral foot appears to be healed We gave him a forefoot offloading boot get he comes in in normal shoes he also tells [place] he has been active. Not sure about the adequacy of his offloading and I talked to him about this Electronic Signature(s) Signed: 11/15/2020 5:48:16 PM By: Linton Ham MD Entered By: Linton Ham on 11/15/2020 17:42:56 -------------------------------------------------------------------------------- Physical Exam Details Patient Name: Date of Service: Dustin Baker, Dustin Baker. 11/15/2020 3:45  PM Medical Record Number: [id number] Patient Account Number: [id number] Date of Birth/Sex: Treating RN: 08/12/1971 (50 y.o. Dustin Baker Primary Care Provider: Leighton Ruff Other Clinician: Referring Provider:  Treating Provider/Extender: [name] in Treatment: 2 Notes Wound exam; 2 wounds 1 the fourth metatarsal and the area just slightly more proximal. The proximal wound is closed some callus but no obvious wound I removed some of the callus but I do not see there is anything open here. The area over the fourth met head is still problematic. This had a lot of callus thick skin over a small open area but when you debride this with a #3 curette removing skin and subcutaneous tissue there is still a fairly deep wound. This does not probe to bone. No evidence of surrounding infection Electronic Signature(s) Signed: 11/15/2020 5:48:16 PM By: Linton Ham MD Entered By: Linton Ham on 11/15/2020 17:44:56 -------------------------------------------------------------------------------- Physician Orders Details Patient Name: Date of Service: Dustin Baker, Dustin Baker. 11/15/2020 3:45 PM Medical Record Number: [id number] Patient Account Number: [id number] Date of Birth/Sex: Treating RN: 09-14-1970 (50 y.o. Dustin Baker Primary Care Provider: Leighton Ruff Other Clinician: Referring Provider: Treating Provider/Extender: [name] in Treatment: 2 Verbal / Phone Orders: No Diagnosis Coding Follow-up Appointments Return Appointment in 1 week. Bathing/ Shower/ Hygiene May shower with protection but do not get wound dressing(s) wet. - when not changing the dressing. May shower and wash wound with soap and water. - with dressing changes. Edema Control - Lymphedema / SCD / Other Elevate legs to the level of the heart or above for 30 minutes daily and/or when sitting, a frequency of: - 3-4 times day throughout the  day. Avoid standing for long periods of time. Moisturize legs daily. - both leg every night before bed. Off-Loading Wedge shoe to: - office to provide to patient today for right foot. patient to wear while walking and standing. only wear regular shoe while driving. Additional Orders / Instructions Other: - check feet every night before bed. Wound Treatment Wound #1 - Metatarsal head fourth Wound Laterality: Right Cleanser: Byram Ancillary Kit - 15 Day Supply (Generic) Every Other Day/30 Days Discharge Instructions: Use supplies as instructed; Kit contains: (15) Saline Bullets; (15) 3x3 Gauze; 15 pr Gloves Cleanser: Soap and Water Every Other Day/30 Days Discharge Instructions: May shower and wash wound with dial antibacterial soap and water prior to dressing change. Prim Dressing: IODOFLEX 0.9% Cadexomer Iodine Pad 4x6 cm (DME) (Generic) Every Other Day/30 Days ary Discharge Instructions: Apply to wound bed as instructed Secondary Dressing: Woven Gauze Sponges 2x2 in (Generic) Every Other Day/30 Days Discharge Instructions: Apply over primary dressing as directed. Secured With: Child psychotherapist, Sterile 2x75 (in/in) (Generic) Every Other Day/30 Days Discharge Instructions: Secure with stretch gauze as directed. Secured With: 69M Medipore H Soft Cloth Surgical Tape, 2x2 (in/yd) (Generic) Every Other Day/30 Days Discharge Instructions: Secure dressing with tape as directed. Electronic Signature(s) Signed: 11/15/2020 5:48:16 PM By: Linton Ham MD Signed: 11/16/2020 5:17:38 PM By: Rhae Hammock RN Entered By: Rhae Hammock on 11/15/2020 17:20:09 -------------------------------------------------------------------------------- Problem List Details Patient Name: Date of Service: Dustin Baker, Dustin Baker. 11/15/2020 3:45 PM Medical Record Number: [id number] Patient Account Number: [id number] Date of Birth/Sex: Treating RN: 07/05/1971 (50 y.o. [name], [name] Primary Care  Provider: Leighton Ruff Other Clinician: Referring Provider: Treating Provider/Extender: [name] in Treatment: 2 Active Problems ICD-10 Encounter Code Description Active Date MDM Diagnosis E11.621 Type 2 diabetes mellitus with foot ulcer 10/27/2020 No Yes L97.512 Non-pressure chronic ulcer of other part of right foot with fat layer exposed 10/27/2020 No Yes E11.42 Type 2 diabetes mellitus with  diabetic polyneuropathy 10/27/2020 No Yes Inactive Problems Resolved Problems Electronic Signature(s) Signed: 11/15/2020 5:48:16 PM By: Linton Ham MD Entered By: Linton Ham on 11/15/2020 17:41:10 -------------------------------------------------------------------------------- Progress Note Details Patient Name: Date of Service: Dustin Baker, Dustin Baker. 11/15/2020 3:45 PM Medical Record Number: [id number] Patient Account Number: [id number] Date of Birth/Sex: Treating RN: Mar 27, 1971 (50 y.o. 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 He is here for review of 2 wounds on his plantar feet the first is 1 over the fourth metatarsal head that is been there for about 6 months. He has no idea how this happened and simply saw blood on his sock. He was not doing anything specific to this except putting a dry dressing on the top. 2 weeks ago we will noted another 1 more inferiorly on the lateral plantar foot. Did not seek any medical attention until 2 days ago saw his primary doctor a culture was done he was prescribed Bactrim and given Bactroban topically. Past medical history includes hypercholesterolemia, hypertension, type 2 diabetes with peripheral neuropathy, history of  EtOH ABI in our clinic was 0.91 on the right 3/15; this is a 3-week follow-up I am not exactly sure how this happened. This is a patient with a wound on the lateral part of his foot and an area over his fourth metatarsal head. We have been using silver collagen to both of these areas. The area on the lateral foot appears to be healed We gave him a forefoot offloading boot get he comes in in normal shoes he also tells [place] he has been active. Not sure about the adequacy of his offloading and I talked to him about this Objective Constitutional Vitals Time Taken: 4:24 PM, Height: 71 in, Source: Stated, Weight: 224 lbs, Source: Stated, BMI: 31.2, Temperature: 98.6 F, Pulse: 97 bpm, Respiratory Rate: 18 breaths/min, Blood Pressure: 127/90 mmHg, Capillary Blood Glucose: 220 mg/dl. General Notes: glucose per pt report on Sunday Integumentary (Hair, Skin) Wound #1 status is Open. Original cause of wound was Gradually Appeared. The date acquired was: 04/03/2020. The wound has been in treatment 2 weeks. The wound is located on the Right Metatarsal head fourth. The wound measures 0.3cm length x 0.2cm width x 0.3cm depth; 0.047cm^2 area and 0.014cm^3 volume. There is Fat Layer (Subcutaneous Tissue) exposed. There is no tunneling noted, however, there is undermining starting at 12:00 and ending at 12:00 with a maximum distance of 0.5cm. There is a small amount of serosanguineous drainage noted. The wound margin is well defined and not attached to the wound base. There is large (67-100%) red granulation within the wound bed. There is no necrotic tissue within the wound bed. Wound #2 status is Open. Original cause of wound was Gradually Appeared. The date acquired was: 09/04/2019. The wound has been in treatment 2 weeks. The wound is located on the [place]. The wound measures 0cm length x 0cm width x 0cm depth; 0cm^2 area and 0cm^3 volume. There is no tunneling or undermining noted. There is a none  present amount of drainage noted. There is no granulation within the wound bed. There is no necrotic tissue within the wound bed. General Notes: callous Assessment Active Problems ICD-10 Type 2 diabetes mellitus with foot ulcer Non-pressure chronic ulcer of other part of right foot with  fat layer exposed Type 2 diabetes mellitus with diabetic polyneuropathy Procedures Wound #1 Pre-procedure diagnosis of Wound #1 is a Diabetic Wound/Ulcer of the Lower Extremity located on the Right Metatarsal head fourth .Severity of Tissue Pre Debridement is: Fat layer exposed. There was a Excisional Skin/Subcutaneous Tissue Debridement with a total area of 0.06 sq cm performed by Ricard Dillon., MD. With the following instrument(s): Curette to remove Viable and Non-Viable tissue/material. Material removed includes Callus, Subcutaneous Tissue, Skin: Dermis, and Skin: Epidermis after achieving pain control using Lidocaine. No specimens were taken. A time out was conducted at 17:16, prior to the start of the procedure. A Minimum amount of bleeding was controlled with Silver Nitrate. The procedure was tolerated well with a pain level of 0 throughout and a pain level of 0 following the procedure. Post Debridement Measurements: 0.3cm length x 0.2cm width x 0.3cm depth; 0.014cm^3 volume. Character of Wound/Ulcer Post Debridement is improved. Severity of Tissue Post Debridement is: Fat layer exposed. Post procedure Diagnosis Wound #1: Same as Pre-Procedure Plan Follow-up Appointments: Return Appointment in 1 week. Bathing/ Shower/ Hygiene: May shower with protection but do not get wound dressing(s) wet. - when not changing the dressing. May shower and wash wound with soap and water. - with dressing changes. Edema Control - Lymphedema / SCD / Other: Elevate legs to the level of the heart or above for 30 minutes daily and/or when sitting, a frequency of: - 3-4 times day throughout the day. Avoid standing for  long periods of time. Moisturize legs daily. - both leg every night before bed. Off-Loading: Wedge shoe to: - office to provide to patient today for right foot. patient to wear while walking and standing. only wear regular shoe while driving. Additional Orders / Instructions: Other: - check feet every night before bed. WOUND #1: - Metatarsal head fourth Wound Laterality: Right Cleanser: Byram Ancillary Kit - 15 Day Supply (Generic) Every Other Day/30 Days Discharge Instructions: Use supplies as instructed; Kit contains: (15) Saline Bullets; (15) 3x3 Gauze; 15 pr Gloves Cleanser: Soap and Water Every Other Day/30 Days Discharge Instructions: May shower and wash wound with dial antibacterial soap and water prior to dressing change. Prim Dressing: IODOFLEX 0.9% Cadexomer Iodine Pad 4x6 cm (DME) (Generic) Every Other Day/30 Days ary Discharge Instructions: Apply to wound bed as instructed Secondary Dressing: Woven Gauze Sponges 2x2 in (Generic) Every Other Day/30 Days Discharge Instructions: Apply over primary dressing as directed. Secured With: Child psychotherapist, Sterile 2x75 (in/in) (Generic) Every Other Day/30 Days Discharge Instructions: Secure with stretch gauze as directed. Secured With: 77M Medipore H Soft Cloth Surgical Baker ape, 2x2 (in/yd) (Generic) Every Other Day/30 Days Discharge Instructions: Secure dressing with tape as directed. 1. Change the primary dressing to Iodoflex to the remaining wound on the fourth met head 2. Emphasized offloading this area Electronic Signature(s) Signed: 11/15/2020 5:48:16 PM By: Linton Ham MD Entered By: Linton Ham on 11/15/2020 17:45:49 -------------------------------------------------------------------------------- SuperBill Details Patient Name: Date of Service: Dustin Baker, Dustin Baker. 11/15/2020 Medical Record Number: [id number] Patient Account Number: [id number] Date of Birth/Sex: Treating RN: [birth year] (50 y.o. Dustin Baker Primary Care Provider: Leighton Ruff Other Clinician: Referring Provider: Treating Provider/Extender: [name] in Treatment: 2 Diagnosis Coding ICD-10 Codes Code Description [phone] Type 2 diabetes mellitus with foot ulcer L97.512 Non-pressure chronic ulcer of other part of right foot with fat layer exposed E11.42 Type 2 diabetes mellitus with diabetic polyneuropathy Facility Procedures CPT4 Code: [id number] Description: [phone] -  DEB SUBQ TISSUE 20 SQ CM/< ICD-10 Diagnosis Description L97.512 Non-pressure chronic ulcer of other part of right foot with fat layer exposed Modifier: Quantity: 1 Physician Procedures : CPT4 Code Description Modifier [id number] - WC PHYS SUBQ TISS 20 SQ CM ICD-10 Diagnosis Description L97.512 Non-pressure chronic ulcer of other part of right foot with fat layer exposed Quantity: 1 Electronic Signature(s) Signed: 11/15/2020 5:48:16 PM By: Linton Ham MD Entered By: Linton Ham on 11/15/2020 17:45:59

## 2020-11-22 ENCOUNTER — Encounter (HOSPITAL_BASED_OUTPATIENT_CLINIC_OR_DEPARTMENT_OTHER): Payer: BLUE CROSS/BLUE SHIELD | Admitting: Internal Medicine

## 2020-11-22 ENCOUNTER — Other Ambulatory Visit (HOSPITAL_COMMUNITY)
Admission: RE | Admit: 2020-11-22 | Discharge: 2020-11-22 | Disposition: A | Discharge status: Home / Self Care | Payer: BLUE CROSS/BLUE SHIELD | Source: Other Acute Inpatient Hospital | Attending: Internal Medicine | Admitting: Internal Medicine

## 2020-11-22 ENCOUNTER — Ambulatory Visit
Admission: RE | Admit: 2020-11-22 | Discharge: 2020-11-22 | Disposition: A | Discharge status: Home / Self Care | Payer: BLUE CROSS/BLUE SHIELD | Source: Ambulatory Visit | Attending: Internal Medicine | Admitting: Internal Medicine

## 2020-11-22 ENCOUNTER — Other Ambulatory Visit: Payer: Self-pay

## 2020-11-22 ENCOUNTER — Other Ambulatory Visit: Payer: Self-pay | Admitting: Internal Medicine

## 2020-11-22 DIAGNOSIS — L97512 Non-pressure chronic ulcer of other part of right foot with fat layer exposed: Secondary | ICD-10-CM | POA: Insufficient documentation

## 2020-11-22 DIAGNOSIS — L97509 Non-pressure chronic ulcer of other part of unspecified foot with unspecified severity: Secondary | ICD-10-CM

## 2020-11-22 DIAGNOSIS — E13621 Other specified diabetes mellitus with foot ulcer: Secondary | ICD-10-CM

## 2020-11-22 NOTE — Progress Notes (Signed)
Dustin Baker, BRIGHAM T ([id number]) . Visit Report for 11/22/2020 Debridement Details Patient Name: Date of Service: [facility], Denice Baker [facility] T. 11/22/2020 8:00 A M Medical Record Number: [id number] Patient Account Number: [id number] Date of Birth/Sex: Treating RN: Mar 24, 1971 (50 y.o. [name], Dustin Primary Care Provider: Leighton Ruff Other Clinician: Referring Provider: Treating Provider/Extender: [name] in Treatment: 3 Debridement Performed for Assessment: Wound #1 Right Metatarsal head fourth Performed By: Physician Ricard Dillon., MD Debridement Type: Debridement Severity of Tissue Pre Debridement: Fat layer exposed Level of Consciousness (Pre-procedure): Awake and Alert Pre-procedure Verification/Time Out Yes - 09:06 Taken: Start Time: 09:06 Pain Control: Lidocaine T Area Debrided (L x W): otal 0.3 (cm) x 0.2 (cm) = 0.06 (cm) Tissue and other material debrided: Viable, Non-Viable, Slough, Subcutaneous, Skin: Dermis , Skin: Epidermis, Slough Level: Skin/Subcutaneous Tissue Debridement Description: Excisional Instrument: Curette Specimen: Tissue Culture Number of Specimens T aken: 1 Bleeding: Minimum Hemostasis Achieved: Pressure End Time: 09:08 Procedural Pain: 0 Post Procedural Pain: 0 Response to Treatment: Procedure was tolerated well Level of Consciousness (Post- Awake and Alert procedure): Post Debridement Measurements of Total Wound Length: (cm) 0.3 Width: (cm) 0.2 Depth: (cm) 0.6 Volume: (cm) 0.028 Character of Wound/Ulcer Post Debridement: Improved Severity of Tissue Post Debridement: Fat layer exposed Post Procedure Diagnosis Same as Pre-procedure Electronic Signature(s) Signed: 11/22/2020 5:23:06 PM By: Rhae Hammock RN Signed: 11/22/2020 5:50:43 PM By: Linton Ham MD Entered By: Linton Ham on 11/22/2020 09:23:29 -------------------------------------------------------------------------------- HPI  Details Patient Name: Date of Service: Dustin Baker, Dustin HN T. 11/22/2020 8:00 A M Medical Record Number: [id number] Patient Account Number: [id number] Date of Birth/Sex: Treating RN: October 09, 1970 (50 y.o. 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 He is here for review of 2 wounds on his plantar feet the first is 1 over the fourth metatarsal head that is been there for about 6 months. He has no idea how this happened and simply saw blood on his sock. He was not doing anything specific to this except putting a dry dressing on the top. 2 weeks ago we will noted another 1 more inferiorly on the lateral plantar foot. Did not seek any medical attention until 2 days ago saw his primary doctor a culture was done he was prescribed Bactrim and given Bactroban topically. Past medical history includes hypercholesterolemia, hypertension, type 2 diabetes with peripheral neuropathy, history of EtOH ABI in our clinic was 0.91 on the right 3/15; this is a 3-week follow-up I am not exactly sure how this happened. This is a patient with a wound on the lateral part of his foot and an area over his fourth metatarsal head. We have been using silver collagen to both of these areas. The area on the lateral foot appears to be healed We gave him a forefoot offloading boot get he comes in in normal shoes he also tells [place] he has been active. Not sure about the adequacy of his offloading and I talked to him about this 3/22; patient came in today with purulent drainage coming out of the wound over the fourth met head on the right foot. A specimen was obtained by her intake nurse for culture. We have been using Iodoflex. The second wound  which was just below this on the lateral plantar foot remains  closed although it is callused. The patient tells me that he is starting a new job next Monday and although he works from home he is going to be in training and he cannot miss any time starting at 830. I told him that I do not really control the schedule here but we could try to fit him in at 7;30 Electronic Signature(s) Signed: 11/22/2020 5:50:43 PM By: Baltazar Najjar MD Entered By: Baltazar Najjar on 11/22/2020 09:24:47 -------------------------------------------------------------------------------- Physical Exam Details Patient Name: Date of Service: [name], Dustin HN T. 11/22/2020 8:00 A M Medical Record Number: [id number] Patient Account Number: [id number] Date of Birth/Sex: Treating RN: 1970/09/16 (50 y.o. Lucious Groves Primary Care Provider: [name] Other Clinician: Referring Provider: Treating Provider/Extender: Arelia Longest in Treatment: 3 Constitutional Patient is hypertensive.. Pulse regular and within target range for patient.Marland Kitchen Respirations regular, non-labored and within target range.. Temperature is normal and within the target range for the patient.Marland Kitchen Appears in no distress. Notes Wound exam; the wound over the right fourth met head had callus around this. I did not see any purulent drainage however this seems deeper than what I remember. I used a #5 curette to aggressively debride off the callus skin and nonviable subcutaneous tissue. No obvious soft tissue infection around the wound no erythema no tenderness this does not go to bone The area just below this on the lateral foot is callused over we heal this out 2 weeks ago. I did not reopen this Electronic Signature(s) Signed: 11/22/2020 5:50:43 PM By: Baltazar Najjar MD Entered By: Baltazar Najjar on 11/22/2020 09:26:02 -------------------------------------------------------------------------------- Physician Orders  Details Patient Name: Date of Service: [name], Dustin HN T. 11/22/2020 8:00 A M Medical Record Number: [id number] Patient Account Number: [id number] Date of Birth/Sex: Treating RN: 1970/10/10 (50 y.o. Lucious Groves Primary Care Provider: [name] Other Clinician: Referring Provider: Treating Provider/Extender: Arelia Longest in Treatment: 3 Verbal / Phone Orders: No Diagnosis Coding Follow-up Appointments Return Appointment in 1 week. Bathing/ Shower/ Hygiene May shower with protection but do not get wound dressing(s) wet. - when not changing the dressing. May shower and wash wound with soap and water. - with dressing changes. Edema Control - Lymphedema / SCD / Other Elevate legs to the level of the heart or above for 30 minutes daily and/or when sitting, a frequency of: - 3-4 times day throughout the day. Avoid standing for long periods of time. Moisturize legs daily. - both leg every night before bed. Off-Loading Wedge shoe to: - office to provide to patient today for right foot. patient to wear while walking and standing. only wear regular shoe while driving. Additional Orders / Instructions Other: - check feet every night before bed. Wound Treatment Wound #1 - Metatarsal head fourth Wound Laterality: Right Cleanser: Byram Ancillary Kit - 15 Day Supply (Generic) Every Other Day/30 Days Discharge Instructions: Use supplies as instructed; Kit contains: (15) Saline Bullets; (15) 3x3 Gauze; 15 pr Gloves Cleanser: Soap and Water Every Other Day/30 Days Discharge Instructions: May shower and wash wound with dial antibacterial soap and water prior to dressing change. Prim Dressing: IODOFLEX 0.9% Cadexomer Iodine Pad 4x6 cm (Generic) Every Other Day/30 Days ary Discharge Instructions: Apply to wound bed as instructed Secondary Dressing: Woven Gauze Sponges 2x2 in (Generic) Every Other Day/30 Days Discharge Instructions: Apply over primary dressing as  directed. Secured With: Insurance underwriter, Sterile 2x75 (in/in) (Generic) Every Other Day/30 Days Discharge Instructions: Secure with stretch gauze as  directed. Secured With: 80M Medipore H Soft Cloth Surgical Tape, 2x2 (in/yd) (Generic) Every Other Day/30 Days Discharge Instructions: Secure dressing with tape as directed. Laboratory naerobe culture (MICRO) Bacteria identified in Unspecified specimen by A LOINC Code: 809-9 Convenience Name: Anerobic culture Patient Medications llergies: penicillin A Notifications Medication Indication Start End wound infection 11/21/2020 doxycycline monohydrate DOSE oral 100 mg capsule - 1 capsule oral bid for 7 days Electronic Signature(s) Signed: 11/22/2020 9:14:38 AM By: Linton Ham MD Entered By: Linton Ham on 11/22/2020 09:14:37 -------------------------------------------------------------------------------- Problem List Details Patient Name: Date of Service: Dustin Baker, Dustin HN T. 11/22/2020 8:00 A M Medical Record Number: [id number] Patient Account Number: [id number] Date of Birth/Sex: Treating RN: 09-07-1970 (50 y.o. [name], Dustin Primary Care Provider: Leighton Ruff Other Clinician: Referring Provider: Treating Provider/Extender: [name] in Treatment: 3 Active Problems ICD-10 Encounter Code Description Active Date MDM Diagnosis E11.621 Type 2 diabetes mellitus with foot ulcer 10/27/2020 No Yes L97.512 Non-pressure chronic ulcer of other part of right foot with fat layer exposed 10/27/2020 No Yes E11.42 Type 2 diabetes mellitus with diabetic polyneuropathy 10/27/2020 No Yes L03.115 Cellulitis of right lower limb 11/22/2020 No Yes Inactive Problems Resolved Problems Electronic Signature(s) Signed: 11/22/2020 5:50:43 PM By: Linton Ham MD Entered By: Linton Ham on 11/22/2020  09:23:11 -------------------------------------------------------------------------------- Progress Note Details Patient Name: Date of Service: Dustin Baker, Dustin HN T. 11/22/2020 8:00 A M Medical Record Number: [id number] Patient Account Number: [id number] Date of Birth/Sex: Treating RN: [birth year] (50 y.o. 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 He is here for review of 2 wounds on his plantar feet the first is 1 over the fourth metatarsal head that is been there for about 6 months. He has no idea how this happened and simply saw blood on his sock. He was not doing anything specific to this except putting a dry dressing on the top. 2 weeks ago we will noted another 1 more inferiorly on the lateral plantar foot. Did not seek any medical attention until 2 days ago saw his primary doctor a culture was done he was prescribed Bactrim and given Bactroban topically. Past medical history includes hypercholesterolemia, hypertension, type 2 diabetes with peripheral neuropathy, history of EtOH ABI in our clinic was 0.91 on the right 3/15; this is a 3-week follow-up I am not exactly sure how this happened. This is a patient with a wound on the lateral part of his foot and an area over his fourth metatarsal head. We have been using silver collagen to both of these areas. The area on the lateral foot appears to be healed We gave him a forefoot offloading boot get he comes in in normal shoes he also tells [place] he has been active. Not sure about the adequacy of his offloading and I talked to him about this 3/22; patient came in today with purulent drainage coming out of the wound over the fourth met head on the right foot. A  specimen was obtained by her intake nurse for culture. We have been using Iodoflex. The second wound which was just below this on the lateral plantar foot remains closed although it is callused. The patient tells me that he is starting a new job next Monday and although he works from home he is going  to be in training and he cannot miss any time starting at 830. I told him that I do not really control the schedule here but we could try to fit him in at 7;30 Objective Constitutional Patient is hypertensive.. Pulse regular and within target range for patient.Marland Kitchen Respirations regular, non-labored and within target range.. Temperature is normal and within the target range for the patient.Marland Kitchen Appears in no distress. Vitals Time Taken: 8:10 AM, Height: 71 in, Weight: 224 lbs, BMI: 31.2, Temperature: 97.8 F, Pulse: 89 bpm, Respiratory Rate: 16 breaths/min, Blood Pressure: 149/96 mmHg. General Notes: Wound exam; the wound over the right fourth met head had callus around this. I did not see any purulent drainage however this seems deeper than what I remember. I used a #5 curette to aggressively debride off the callus skin and nonviable subcutaneous tissue. No obvious soft tissue infection around the wound no erythema no tenderness this does not go to bone ooThe area just below this on the lateral foot is callused over we heal this out 2 weeks ago. I did not reopen this Integumentary (Hair, Skin) Wound #1 status is Open. Original cause of wound was Gradually Appeared. The date acquired was: 04/03/2020. The wound has been in treatment 3 weeks. The wound is located on the Right Metatarsal head fourth. The wound measures 0.3cm length x 0.2cm width x 0.6cm depth; 0.047cm^2 area and 0.028cm^3 volume. There is Fat Layer (Subcutaneous Tissue) exposed. There is no tunneling or undermining noted. There is a small amount of purulent drainage noted. The wound margin is well defined and not attached to the wound base.  There is large (67-100%) red granulation within the wound bed. There is no necrotic tissue within the wound bed. General Notes: Callused Periwound Assessment Active Problems ICD-10 Type 2 diabetes mellitus with foot ulcer Non-pressure chronic ulcer of other part of right foot with fat layer exposed Type 2 diabetes mellitus with diabetic polyneuropathy Cellulitis of right lower limb Procedures Wound #1 Pre-procedure diagnosis of Wound #1 is a Diabetic Wound/Ulcer of the Lower Extremity located on the Right Metatarsal head fourth .Severity of Tissue Pre Debridement is: Fat layer exposed. There was a Excisional Skin/Subcutaneous Tissue Debridement with a total area of 0.06 sq cm performed by Maxwell Caul., MD. With the following instrument(s): Curette to remove Viable and Non-Viable tissue/material. Material removed includes Subcutaneous Tissue, Slough, Skin: Dermis, and Skin: Epidermis after achieving pain control using Lidocaine. 1 specimen was taken by a Tissue Culture and sent to the lab per facility protocol. A time out was conducted at 09:06, prior to the start of the procedure. A Minimum amount of bleeding was controlled with Pressure. The procedure was tolerated well with a pain level of 0 throughout and a pain level of 0 following the procedure. Post Debridement Measurements: 0.3cm length x 0.2cm width x 0.6cm depth; 0.028cm^3 volume. Character of Wound/Ulcer Post Debridement is improved. Severity of Tissue Post Debridement is: Fat layer exposed. Post procedure Diagnosis Wound #1: Same as Pre-Procedure Plan Follow-up Appointments: Return Appointment in 1 week. Bathing/ Shower/ Hygiene: May shower with protection but do not get wound dressing(s) wet. - when not changing the dressing. May shower and wash wound with soap and water. - with dressing changes. Edema Control - Lymphedema / SCD / Other: Elevate legs to the level of the heart or above for 30 minutes daily and/or when  sitting, a frequency of: - 3-4 times day throughout the day. Avoid standing for long periods of time. Moisturize legs  daily. - both leg every night before bed. Off-Loading: Wedge shoe to: - office to provide to patient today for right foot. patient to wear while walking and standing. only wear regular shoe while driving. Additional Orders / Instructions: Other: - check feet every night before bed. Laboratory ordered were: Anerobic culture The following medication(s) was prescribed: doxycycline monohydrate oral 100 mg capsule 1 capsule oral bid for 7 days for wound infection starting 11/21/2020 WOUND #1: - Metatarsal head fourth Wound Laterality: Right Cleanser: Byram Ancillary Kit - 15 Day Supply (Generic) Every Other Day/30 Days Discharge Instructions: Use supplies as instructed; Kit contains: (15) Saline Bullets; (15) 3x3 Gauze; 15 pr Gloves Cleanser: Soap and Water Every Other Day/30 Days Discharge Instructions: May shower and wash wound with dial antibacterial soap and water prior to dressing change. Prim Dressing: IODOFLEX 0.9% Cadexomer Iodine Pad 4x6 cm (Generic) Every Other Day/30 Days ary Discharge Instructions: Apply to wound bed as instructed Secondary Dressing: Woven Gauze Sponges 2x2 in (Generic) Every Other Day/30 Days Discharge Instructions: Apply over primary dressing as directed. Secured With: Child psychotherapist, Sterile 2x75 (in/in) (Generic) Every Other Day/30 Days Discharge Instructions: Secure with stretch gauze as directed. Secured With: 30M Medipore H Soft Cloth Surgical T ape, 2x2 (in/yd) (Generic) Every Other Day/30 Days Discharge Instructions: Secure dressing with tape as directed. 1. Empiric doxycycline while await the culture 2. Change the dressing to silver alginate 3. X-ray of the right foot diabetic chronic wound etc. 4. Await CandS to return 5. I told him we would try to work him in at 7:30 in the morning and get him home for 8:30. This would  have bad implications if we were to consider a total contact cast Electronic Signature(s) Signed: 11/22/2020 5:50:43 PM By: Linton Ham MD Entered By: Linton Ham on 11/22/2020 09:27:01 -------------------------------------------------------------------------------- SuperBill Details Patient Name: Date of Service: Dustin Baker, Dustin HN T. 11/22/2020 Medical Record Number: [id number] Patient Account Number: [id number] Date of Birth/Sex: Treating RN: 06-01-1971 (50 y.o. [name], Dustin Primary Care Provider: Leighton Ruff Other Clinician: Referring Provider: Treating Provider/Extender: [name] in Treatment: 3 Diagnosis Coding ICD-10 Codes Code Description [phone] Type 2 diabetes mellitus with foot ulcer L97.512 Non-pressure chronic ulcer of other part of right foot with fat layer exposed E11.42 Type 2 diabetes mellitus with diabetic polyneuropathy L03.115 Cellulitis of right lower limb Facility Procedures CPT4 Code: [id number] Description: 33825 - DEB SUBQ TISSUE 20 SQ CM/< ICD-10 Diagnosis Description L97.512 Non-pressure chronic ulcer of other part of right foot with fat layer exposed Modifier: Quantity: 1 Physician Procedures : CPT4 Code Description Modifier [id number] - WC PHYS SUBQ TISS 20 SQ CM ICD-10 Diagnosis Description L97.512 Non-pressure chronic ulcer of other part of right foot with fat layer exposed Quantity: 1 Electronic Signature(s) Signed: 11/22/2020 5:50:43 PM By: Linton Ham MD Entered By: Linton Ham on 11/22/2020 09:27:10

## 2020-11-23 NOTE — Progress Notes (Signed)
Dustin Baker, MCKEONE T ([id number]) . Visit Report for 11/22/2020 Arrival Information Details Patient Name: Date of Service: [facility] ER, Dustin Baker [facility] T. 11/22/2020 8:00 A M Medical Record Number: [id number] Patient Account Number: [id number] Date of Birth/Sex: Treating RN: [birth year] (50 y.o. [name] Primary Care Valente Fosberg: Dustin Baker Other Clinician: Referring Dustin Baker: Treating Dustin Baker/Extender: [name] in Treatment: 3 Visit Information History Since Last Visit Added or deleted any medications: No Patient Arrived: Ambulatory Any new allergies or adverse reactions: No Arrival Time: 08:10 Had a fall or experienced change in No Transfer Assistance: None activities of daily living that may affect Patient Identification Verified: Yes risk of falls: Secondary Verification Process Completed: Yes Signs or symptoms of abuse/neglect since last visito No Patient Requires Transmission-Based Precautions: No Hospitalized since last visit: No Patient Has Alerts: No Implantable device outside of the clinic excluding No cellular tissue based products placed in the center since last visit: Has Dressing in Place as Prescribed: Yes Pain Present Now: No Electronic Signature(s) Signed: 11/22/2020 5:02:10 PM By: Lorrin Jackson Entered By: Lorrin Jackson on 11/22/2020 08:10:32 -------------------------------------------------------------------------------- Encounter Discharge Information Details Patient Name: Date of Service: Dustin Baker, [name] 11/22/2020 8:00 A M Medical Record Number: [id number] Patient Account Number: [id number] Date of Birth/Sex: Treating RN: 29-Jan-1971 (50 y.o. [name] Primary Care [name]: Dustin Baker Other Clinician: Referring [name]: Treating Dustin Baker/Extender: [name] in Treatment: 3 Encounter Discharge Information Items Post Procedure Vitals Discharge Condition: Stable Temperature (F):  97.8 Ambulatory Status: Ambulatory Pulse (bpm): 89 Discharge Destination: Home Respiratory Rate (breaths/min): 16 Transportation: Private Auto Blood Pressure (mmHg): 149/96 Schedule Follow-up Appointment: Yes Clinical Summary of Care: Provided on 11/22/2020 Form Type Recipient Paper Patient Patient Electronic Signature(s) Signed: 11/22/2020 9:11:32 AM By: Lorrin Jackson Entered By: Lorrin Jackson on 11/22/2020 09:11:32 -------------------------------------------------------------------------------- Lower Extremity Assessment Details Patient Name: Date of Service: Dustin Baker, [name] 11/22/2020 8:00 A M Medical Record Number: [id number] Patient Account Number: [id number] Date of Birth/Sex: Treating RN: Sep 02, 1971 (50 y.o. [name] Primary Care [place] Firkus: Dustin Baker Other Clinician: Referring Alexxis Mackert: Treating Daden Mahany/Extender: [name] in Treatment: 3 Edema Assessment Assessed: [name]: No] Patrice Paradise: Yes] Edema: [Left: N] [Right: o] Calf Left: Right: Point of Measurement: 34 cm From Medial Instep 37 cm Ankle Left: Right: Point of Measurement: 13 cm From Medial Instep 22 cm Vascular Assessment Pulses: Dorsalis Pedis Palpable: [Right:Yes] Electronic Signature(s) Signed: 11/22/2020 5:02:10 PM By: Lorrin Jackson Entered By: Lorrin Jackson on 11/22/2020 08:12:57 -------------------------------------------------------------------------------- Multi Wound Chart Details Patient Name: Date of Service: Dustin Baker, [name] 11/22/2020 8:00 A M Medical Record Number: [id number] Patient Account Number: [id number] Date of Birth/Sex: Treating RN: Feb 24, 1971 (50 y.o. Dustin Baker, Dustin Baker Primary Care [name]: Dustin Baker Other Clinician: Referring [name]: Treating [name]/Extender: [name] in Treatment: 3 Vital Signs Height(in): 71 Pulse(bpm): 89 Weight(lbs): 224 Blood Pressure(mmHg): 149/96 Body  Mass Index(BMI): 31 Temperature(F): 97.8 Respiratory Rate(breaths/min): 16 Photos: [1:No Photos Right Metatarsal head fourth] [N/A:N/A N/A] Wound Location: [1:Gradually Appeared] [N/A:N/A] Wounding Event: [1:Diabetic Wound/Ulcer of the Lower] [N/A:N/A] Primary Etiology: [1:Extremity Hypertension, Type II Diabetes,] [N/A:N/A] Comorbid History: [1:Neuropathy 04/03/2020] [N/A:N/A] Date Acquired: [1:3] [N/A:N/A] Weeks of Treatment: [1:Open] [N/A:N/A] Wound Status: [1:0.3x0.2x0.6] [N/A:N/A] Measurements L x W x D (cm) [1:0.047] [N/A:N/A] A (cm) : rea [1:0.028] [N/A:N/A] Volume (cm) : [1:85.00%] [N/A:N/A] % Reduction in A rea: [1:70.20%] [N/A:N/A] % Reduction in Volume: [1:Grade 2] [N/A:N/A] Classification: [1:Small] [N/A:N/A] Exudate A mount: [1:Purulent] [N/A:N/A] Exudate Type: [  1:yellow, brown, green] [N/A:N/A] Exudate Color: [1:Well defined, not attached] [N/A:N/A] Wound Margin: [1:Large (67-100%)] [N/A:N/A] Granulation A mount: [1:Red] [N/A:N/A] Granulation Quality: [1:None Present (0%)] [N/A:N/A] Necrotic A mount: [1:Fat Layer (Subcutaneous Tissue): Yes N/A] Exposed Structures: [1:Fascia: No Tendon: No Muscle: No Joint: No Bone: No None] [N/A:N/A] Epithelialization: [1:Debridement - Excisional] [N/A:N/A] Debridement: Pre-procedure Verification/Time Out 09:06 [N/A:N/A] Taken: [1:Lidocaine] [N/A:N/A] Pain Control: [1:Subcutaneous, Slough] [N/A:N/A] Tissue Debrided: [1:Skin/Subcutaneous Tissue] [N/A:N/A] Level: [1:0.06] [N/A:N/A] Debridement A (sq cm): [1:rea Curette] [N/A:N/A] Instrument: [1:Minimum] [N/A:N/A] Bleeding: [1:Pressure] [N/A:N/A] Hemostasis A chieved: [1:0] [N/A:N/A] Procedural Pain: [1:0] [N/A:N/A] Post Procedural Pain: [1:Procedure was tolerated well] [N/A:N/A] Debridement Treatment Response: [1:0.3x0.2x0.6] [N/A:N/A] Post Debridement Measurements L x W x D (cm) [1:0.028] [N/A:N/A] Post Debridement Volume: (cm) [1:Callused Periwound] [N/A:N/A] Assessment  Notes: [1:Debridement] [N/A:N/A] Treatment Notes Wound #1 (Metatarsal head fourth) Wound Laterality: Right Cleanser Byram Ancillary Kit - 15 Day Supply Discharge Instruction: Use supplies as instructed; Kit contains: (15) Saline Bullets; (15) 3x3 Gauze; 15 pr Gloves Soap and Water Discharge Instruction: May shower and wash wound with dial antibacterial soap and water prior to dressing change. Peri-Wound Care Topical Primary Dressing IODOFLEX 0.9% Cadexomer Iodine Pad 4x6 cm Discharge Instruction: Apply to wound bed as instructed Secondary Dressing Woven Gauze Sponges 2x2 in Discharge Instruction: Apply over primary dressing as directed. Secured With Conforming Stretch Gauze Bandage, Sterile 2x75 (in/in) Discharge Instruction: Secure with stretch gauze as directed. 87M Medipore H Soft Cloth Surgical T ape, 2x2 (in/yd) Discharge Instruction: Secure dressing with tape as directed. Compression Wrap Compression Stockings Add-Ons Electronic Signature(s) Signed: 11/22/2020 5:23:06 PM By: Rhae Hammock RN Signed: 11/22/2020 5:50:43 PM By: Linton Ham MD Entered By: Linton Ham on 11/22/2020 09:23:20 -------------------------------------------------------------------------------- Multi-Disciplinary Care Plan Details Patient Name: Date of Service: Dustin Baker, [name] 11/22/2020 8:00 A M Medical Record Number: [id number] Patient Account Number: [id number] Date of Birth/Sex: Treating RN: 11-20-1970 (50 y.o. Dustin Baker, Dustin Baker Primary Care Kynan Peasley: Dustin Baker Other Clinician: Referring Silvio Sausedo: Treating Ailany Koren/Extender: [name] in Treatment: 3 Active Inactive Nutrition Nursing Diagnoses: Impaired glucose control: actual or potential Goals: Patient/caregiver agrees to and verbalizes understanding of need to obtain nutritional consultation Date Initiated: 10/27/2020 Target Resolution Date: 11/11/2020 Goal Status:  Active Patient/caregiver will maintain therapeutic glucose control Date Initiated: 10/27/2020 Date Inactivated: 11/22/2020 Target Resolution Date: 11/11/2020 Goal Status: Met Interventions: Assess HgA1c results as ordered upon admission and as needed Provide education on elevated blood sugars and impact on wound healing Provide education on nutrition Treatment Activities: Obtain HgA1c : 10/27/2020 Patient referred to Primary Care Physician for further nutritional evaluation : 10/27/2020 Notes: Pain, Acute or Chronic Nursing Diagnoses: Pain, acute or chronic: actual or potential Potential alteration in comfort, pain Goals: Patient will verbalize adequate pain control and receive pain control interventions during procedures as needed Date Initiated: 10/27/2020 Date Inactivated: 11/22/2020 Target Resolution Date: 11/10/2020 Goal Status: Met Patient/caregiver will verbalize comfort level met Date Initiated: 10/27/2020 Target Resolution Date: 12/03/2020 Goal Status: Active Interventions: Encourage patient to take pain medications as prescribed Provide education on pain management Reposition patient for comfort Treatment Activities: Administer pain control measures as ordered : 10/27/2020 Notes: Wound/Skin Impairment Nursing Diagnoses: Knowledge deficit related to ulceration/compromised skin integrity Goals: Patient/caregiver will verbalize understanding of skin care regimen Date Initiated: 10/27/2020 Target Resolution Date: 12/03/2020 Goal Status: Active Interventions: Assess patient/caregiver ability to obtain necessary supplies Assess patient/caregiver ability to perform ulcer/skin care regimen upon admission and as needed Provide education on ulcer and skin care Treatment Activities: Skin care  regimen initiated : 10/27/2020 Topical wound management initiated : 10/27/2020 Notes: Electronic Signature(s) Signed: 11/22/2020 5:23:06 PM By: Rhae Hammock RN Entered By: Rhae Hammock on 11/22/2020 08:56:05 -------------------------------------------------------------------------------- Pain Assessment Details Patient Name: Date of Service: Dustin Baker, [name] 11/22/2020 8:00 A M Medical Record Number: [id number] Patient Account Number: [id number] Date of Birth/Sex: Treating RN: 1971/08/21 (50 y.o. [name] Primary Care Charlayne Vultaggio: Dustin Baker Other Clinician: Referring Ashlyne Olenick: Treating Von Inscoe/Extender: [name] in Treatment: 3 Active Problems Location of Pain Severity and Description of Pain Patient Has Paino No Site Locations Pain Management and Medication Current Pain Management: Electronic Signature(s) Signed: 11/22/2020 5:02:10 PM By: Lorrin Jackson Entered By: Lorrin Jackson on 11/22/2020 08:11:29 -------------------------------------------------------------------------------- Patient/Caregiver Education Details Patient Name: Date of Service: Dustin Baker, Remo Lipps 3/22/2022andnbsp8:00 [place] Record Number: [id number] Patient Account Number: [id number] Date of Birth/Gender: Treating RN: 01-03-1971 (50 y.o. [name] Primary Care Physician: Dustin Baker Other Clinician: Referring Physician: Treating Physician/Extender: [name] in Treatment: 3 Education Assessment Education Provided To: Patient Education Topics Provided Basic Hygiene: Methods: Explain/Verbal Responses: State content correctly Pain: Methods: Explain/Verbal Responses: State content correctly Electronic Signature(s) Signed: 11/22/2020 5:23:06 PM By: Rhae Hammock RN Entered By: Rhae Hammock on 11/22/2020 08:56:37 -------------------------------------------------------------------------------- Wound Assessment Details Patient Name: Date of Service: Dustin Baker, [name] 11/22/2020 8:00 A M Medical Record Number: [id number] Patient Account Number: [id number] Date of  Birth/Sex: Treating RN: Nov 04, 1970 (50 y.o. [name] Primary Care Freddy Kinne: Dustin Baker Other Clinician: Referring Pebbles Zeiders: Treating Ethanjames Fontenot/Extender: [name] in Treatment: 3 Wound Status Wound Number: 1 Primary Etiology: Diabetic Wound/Ulcer of the Lower Extremity Wound Location: Right Metatarsal head fourth Wound Status: Open Wounding Event: Gradually Appeared Comorbid History: Hypertension, Type II Diabetes, Neuropathy Date Acquired: 04/03/2020 Weeks Of Treatment: 3 Clustered Wound: No Photos Wound Measurements Length: (cm) 0.3 Width: (cm) 0.2 Depth: (cm) 0.6 Area: (cm) 0.047 Volume: (cm) 0.028 Wound Description Classification: Grade 2 Wound Margin: Well defined, not attached Exudate Amount: Small Exudate Type: Purulent Exudate Color: yellow, brown, green Foul Odor After Cleansing: No Slough/Fibrino No % Reduction in Area: 85% % Reduction in Volume: 70.2% Epithelialization: None Tunneling: No Undermining: No Wound Bed Granulation Amount: Large (67-100%) Exposed Structure Granulation Quality: Red Fascia Exposed: No Necrotic Amount: None Present (0%) Fat Layer (Subcutaneous Tissue) Exposed: Yes Tendon Exposed: No Muscle Exposed: No Joint Exposed: No Bone Exposed: No Assessment Notes Callused Periwound Treatment Notes Wound #1 (Metatarsal head fourth) Wound Laterality: Right Cleanser Byram Ancillary Kit - 15 Day Supply Discharge Instruction: Use supplies as instructed; Kit contains: (15) Saline Bullets; (15) 3x3 Gauze; 15 pr Gloves Soap and Water Discharge Instruction: May shower and wash wound with dial antibacterial soap and water prior to dressing change. Peri-Wound Care Topical Primary Dressing IODOFLEX 0.9% Cadexomer Iodine Pad 4x6 cm Discharge Instruction: Apply to wound bed as instructed Secondary Dressing Woven Gauze Sponges 2x2 in Discharge Instruction: Apply over primary dressing as  directed. Secured With Conforming Stretch Gauze Bandage, Sterile 2x75 (in/in) Discharge Instruction: Secure with stretch gauze as directed. 14M Medipore H Soft Cloth Surgical T ape, 2x2 (in/yd) Discharge Instruction: Secure dressing with tape as directed. Compression Wrap Compression Stockings Add-Ons Electronic Signature(s) Signed: 11/22/2020 5:02:10 PM By: Lorrin Jackson Signed: 11/23/2020 7:45:09 AM By: Sandre Kitty Entered By: Sandre Kitty on 11/22/2020 16:10:23 -------------------------------------------------------------------------------- Vitals Details Patient Name: Date of Service: Dustin Baker, [place] T. 11/22/2020 8:00 A M Medical Record Number: [id number] Patient  Account Number: [id number] Date of Birth/Sex: Treating RN: [birth year] (50 y.o. [name] Primary Care Keyah Blizard: Dustin Baker Other Clinician: Referring Ankith Edmonston: Treating Blayklee Mable/Extender: [name] in Treatment: 3 Vital Signs Time Taken: 08:10 Temperature (F): 97.8 Height (in): 71 Pulse (bpm): 89 Weight (lbs): 224 Respiratory Rate (breaths/min): 16 Body Mass Index (BMI): 31.2 Blood Pressure (mmHg): 149/96 Reference Range: 80 - 120 mg / dl Electronic Signature(s) Signed: 11/22/2020 5:02:10 PM By: Lorrin Jackson Entered By: Lorrin Jackson on 11/22/2020 08:11:19

## 2020-11-24 LAB — AEROBIC CULTURE W GRAM STAIN (SUPERFICIAL SPECIMEN)

## 2020-11-25 LAB — AEROBIC CULTURE W GRAM STAIN (SUPERFICIAL SPECIMEN): Gram Stain: NONE SEEN

## 2020-12-01 ENCOUNTER — Other Ambulatory Visit: Payer: Self-pay

## 2020-12-01 ENCOUNTER — Encounter (HOSPITAL_BASED_OUTPATIENT_CLINIC_OR_DEPARTMENT_OTHER): Payer: BLUE CROSS/BLUE SHIELD | Admitting: Internal Medicine

## 2020-12-01 DIAGNOSIS — E11621 Type 2 diabetes mellitus with foot ulcer: Secondary | ICD-10-CM | POA: Diagnosis not present

## 2020-12-01 DIAGNOSIS — I10 Essential (primary) hypertension: Secondary | ICD-10-CM | POA: Diagnosis not present

## 2020-12-01 DIAGNOSIS — L97512 Non-pressure chronic ulcer of other part of right foot with fat layer exposed: Secondary | ICD-10-CM | POA: Diagnosis not present

## 2020-12-01 DIAGNOSIS — L03115 Cellulitis of right lower limb: Secondary | ICD-10-CM | POA: Diagnosis not present

## 2020-12-01 DIAGNOSIS — E1142 Type 2 diabetes mellitus with diabetic polyneuropathy: Secondary | ICD-10-CM | POA: Diagnosis not present

## 2020-12-01 NOTE — Progress Notes (Signed)
Dustin Baker, Dustin Baker ([id number]) . Visit Report for 12/01/2020 Arrival Information Details Patient Name: Date of Service: Dustin Baker, Dustin Baker. 12/01/2020 7:30 A M Medical Record Number: [id number] Patient Account Number: [id number] Date of Birth/Sex: Treating RN: 1971/05/05 (50 y.o. Dustin Baker, Dustin Baker Primary Care Dustin Baker: Dustin Baker Other Clinician: Referring Dustin Baker: Treating Dustin Baker/Extender: Dustin Baker in Treatment: 5 Visit Information History Since Last Visit Added or deleted any medications: No Patient Arrived: Ambulatory Any new allergies or adverse reactions: No Arrival Time: 07:46 Had a fall or experienced change in No Accompanied By: self activities of daily living that may affect Transfer Assistance: None risk of falls: Patient Identification Verified: Yes Signs or symptoms of abuse/neglect since last visito No Secondary Verification Process Completed: Yes Hospitalized since last visit: No Patient Requires Transmission-Based Precautions: No Implantable device outside of the clinic excluding No Patient Has Alerts: No cellular tissue based products placed in the center since last visit: Has Dressing in Place as Prescribed: Yes Pain Present Now: No Electronic Signature(s) Signed: 12/01/2020 5:43:47 PM By: Baruch Gouty RN, BSN Entered By: Baruch Gouty on 12/01/2020 07:47:32 -------------------------------------------------------------------------------- Encounter Discharge Information Details Patient Name: Date of Service: Dustin Baker, Dustin Baker. 12/01/2020 7:30 A M Medical Record Number: [id number] Patient Account Number: [id number] Date of Birth/Sex: Treating RN: 25-Jun-1971 (50 y.o. Dustin Baker Primary Care Dustin Baker: Dustin Baker Other Clinician: Referring Dustin Baker: Treating Dustin Baker/Extender: Dustin Baker in Treatment: 5 Encounter Discharge Information Items Post Procedure Vitals Discharge  Condition: Stable Temperature (F): 98.6 Ambulatory Status: Ambulatory Pulse (bpm): 97 Discharge Destination: Home Respiratory Rate (breaths/min): 18 Transportation: Private Auto Blood Pressure (mmHg): 131/85 Accompanied By: self Schedule Follow-up Appointment: Yes Clinical Summary of Care: Electronic Signature(s) Signed: 12/01/2020 5:47:39 PM By: Deon Pilling Entered By: Deon Pilling on 12/01/2020 08:10:11 -------------------------------------------------------------------------------- Lower Extremity Assessment Details Patient Name: Date of Service: Dustin Baker, Dustin Baker. 12/01/2020 7:30 A M Medical Record Number: [id number] Patient Account Number: [id number] Date of Birth/Sex: Treating RN: [birth year] (50 y.o. Dustin Baker Primary Care Dustin Baker: Dustin Baker Other Clinician: Referring Dustin Baker: Treating Dustin Baker/Extender: Dustin Baker in Treatment: 5 Edema Assessment Assessed: [Left: No] [Right: No] Edema: [Left: N] [Right: o] Calf Left: Right: Point of Measurement: 34 cm From Medial Instep 38.5 cm Ankle Left: Right: Point of Measurement: 13 cm From Medial Instep 22 cm Vascular Assessment Pulses: Dorsalis Pedis Palpable: [Right:Yes] Electronic Signature(s) Signed: 12/01/2020 5:43:47 PM By: Baruch Gouty RN, BSN Entered By: Baruch Gouty on 12/01/2020 07:49:27 -------------------------------------------------------------------------------- Multi Wound Chart Details Patient Name: Date of Service: Dustin Baker, Dustin Baker. 12/01/2020 7:30 A M Medical Record Number: [id number] Patient Account Number: [id number] Date of Birth/Sex: Treating RN: 1970/12/17 (50 y.o. Dustin Baker, [name] Primary Care Aldena Worm: Dustin Baker Other Clinician: Referring Dustin Baker: Treating Dustin Baker/Extender: Dustin Baker in Treatment: 5 Vital Signs Height(in): 71 Pulse(bpm): 97 Weight(lbs): 224 Blood Pressure(mmHg): 131/85 Body  Mass Index(BMI): 31 Temperature(F): 98.6 Respiratory Rate(breaths/min): 18 Photos: [1:No Photos Right Metatarsal head fourth] [N/A:N/A N/A] Wound Location: [1:Gradually Appeared] [N/A:N/A] Wounding Event: [1:Diabetic Wound/Ulcer of the Lower] [N/A:N/A] Primary Etiology: [1:Extremity Hypertension, Type II Diabetes,] [N/A:N/A] Comorbid History: [1:Neuropathy 04/03/2020] [N/A:N/A] Date Acquired: [1:5] [N/A:N/A] Weeks of Treatment: [1:Open] [N/A:N/A] Wound Status: [1:0.4x0.5x0.4] [N/A:N/A] Measurements L x W x D (cm) [1:0.157] [N/A:N/A] A (cm) : rea [1:0.063] [N/A:N/A] Volume (cm) : [1:50.00%] [N/A:N/A] % Reduction in A rea: [1:33.00%] [N/A:N/A] % Reduction in Volume: [1:12] Starting Position 1 (o'clock): [1:12] Ending Position 1 (o'clock): [1:0.3] Maximum  Distance 1 (cm): [1:Yes] [N/A:N/A] Undermining: [1:Grade 2] [N/A:N/A] Classification: [1:Small] [N/A:N/A] Exudate A mount: [1:Purulent] [N/A:N/A] Exudate Type: [1:yellow, brown, green] [N/A:N/A] Exudate Color: [1:Well defined, not attached] [N/A:N/A] Wound Margin: [1:Large (67-100%)] [N/A:N/A] Granulation A mount: [1:Red] [N/A:N/A] Granulation Quality: [1:Small (1-33%)] [N/A:N/A] Necrotic A mount: [1:Fat Layer (Subcutaneous Tissue): Yes N/A] Exposed Structures: [1:Fascia: No Tendon: No Muscle: No Joint: No Bone: No None] [N/A:N/A] Epithelialization: [1:Debridement - Selective/Open Wound N/A] Debridement: Pre-procedure Verification/Time Out 07:55 [N/A:N/A] Taken: [1:Lidocaine 4% Topical Solution] [N/A:N/A] Pain Control: [1:Callus] [N/A:N/A] Tissue Debrided: [1:Non-Viable Tissue] [N/A:N/A] Level: [1:1] [N/A:N/A] Debridement A (sq cm): [1:rea Curette] [N/A:N/A] Instrument: [1:Minimum] [N/A:N/A] Bleeding: [1:Pressure] [N/A:N/A] Hemostasis A chieved: [1:0] [N/A:N/A] Procedural Pain: [1:0] [N/A:N/A] Post Procedural Pain: [1:Procedure was tolerated well] [N/A:N/A] Debridement Treatment Response: [1:0.4x0.5x0.3] [N/A:N/A] Post  Debridement Measurements L x W x D (cm) [1:0.047] [N/A:N/A] Post Debridement Volume: (cm) [1:Debridement] [N/A:N/A] Treatment Notes Electronic Signature(s) Signed: 12/01/2020 5:43:02 PM By: Linton Ham MD Signed: 12/01/2020 5:47:39 PM By: Deon Pilling Entered By: Linton Ham on 12/01/2020 08:02:29 -------------------------------------------------------------------------------- Multi-Disciplinary Care Plan Details Patient Name: Date of Service: Dustin Baker, Dustin Baker. 12/01/2020 7:30 A M Medical Record Number: [id number] Patient Account Number: [id number] Date of Birth/Sex: Treating RN: 09-28-1970 (50 y.o. Dustin Baker Primary Care Pyper Olexa: Dustin Baker Other Clinician: Referring Kameisha Malicki: Treating Alessio Bogan/Extender: Dustin Baker in Treatment: 5 Active Inactive Nutrition Nursing Diagnoses: Impaired glucose control: actual or potential Goals: Patient/caregiver agrees to and verbalizes understanding of need to obtain nutritional consultation Date Initiated: 10/27/2020 Target Resolution Date: 12/02/2020 Goal Status: Active Patient/caregiver will maintain therapeutic glucose control Date Initiated: 10/27/2020 Date Inactivated: 11/22/2020 Target Resolution Date: 11/11/2020 Goal Status: Met Interventions: Assess HgA1c results as ordered upon admission and as needed Provide education on elevated blood sugars and impact on wound healing Provide education on nutrition Treatment Activities: Obtain HgA1c : 10/27/2020 Patient referred to Primary Care Physician for further nutritional evaluation : 10/27/2020 Notes: Wound/Skin Impairment Nursing Diagnoses: Knowledge deficit related to ulceration/compromised skin integrity Goals: Patient/caregiver will verbalize understanding of skin care regimen Date Initiated: 10/27/2020 Target Resolution Date: 12/23/2020 Goal Status: Active Interventions: Assess patient/caregiver ability to obtain necessary  supplies Assess patient/caregiver ability to perform ulcer/skin care regimen upon admission and as needed Provide education on ulcer and skin care Treatment Activities: Skin care regimen initiated : 10/27/2020 Topical wound management initiated : 10/27/2020 Notes: Electronic Signature(s) Signed: 12/01/2020 5:47:39 PM By: Deon Pilling Entered By: Deon Pilling on 12/01/2020 07:58:03 -------------------------------------------------------------------------------- Pain Assessment Details Patient Name: Date of Service: Dustin Baker, Dustin Baker. 12/01/2020 7:30 A M Medical Record Number: [id number] Patient Account Number: [id number] Date of Birth/Sex: Treating RN: 1970/10/05 (50 y.o. Dustin Baker Primary Care Lankford Gutzmer: Dustin Baker Other Clinician: Referring Louanne Calvillo: Treating Nevaeha Finerty/Extender: Dustin Baker in Treatment: 5 Active Problems Location of Pain Severity and Description of Pain Patient Has Paino No Site Locations Rate the pain. Rate the pain. Current Pain Level: 0 Pain Management and Medication Current Pain Management: Electronic Signature(s) Signed: 12/01/2020 5:43:47 PM By: Baruch Gouty RN, BSN Entered By: Baruch Gouty on 12/01/2020 07:48:51 -------------------------------------------------------------------------------- Patient/Caregiver Education Details Patient Name: Date of Service: Dustin Baker, Remo Lipps 3/31/2022andnbsp7:30 A M Medical Record Number: [id number] Patient Account Number: [id number] Date of Birth/Gender: Treating RN: 11/10/1970 (50 y.o. Dustin Baker Primary Care Physician: Dustin Baker Other Clinician: Referring Physician: Treating Physician/Extender: Dustin Baker in Treatment: 5 Education Assessment Education Provided To: Patient Education Topics Provided Elevated Blood Sugar/ Impact on Healing: Handouts:  Elevated Blood Sugars: How Do They Affect Wound Healing Methods:  Explain/Verbal Responses: Reinforcements needed Electronic Signature(s) Signed: 12/01/2020 5:47:39 PM By: Deon Pilling Entered By: Deon Pilling on 12/01/2020 07:58:14 -------------------------------------------------------------------------------- Wound Assessment Details Patient Name: Date of Service: Dustin Baker, Dustin Baker. 12/01/2020 7:30 A M Medical Record Number: [id number] Patient Account Number: [id number] Date of Birth/Sex: Treating RN: July 22, 1971 (50 y.o. Dustin Baker Primary Care Bernestine Holsapple: Dustin Baker Other Clinician: Referring Bellina Tokarczyk: Treating Tniya Bowditch/Extender: Dustin Baker in Treatment: 5 Wound Status Wound Number: 1 Primary Etiology: Diabetic Wound/Ulcer of the Lower Extremity Wound Location: Right Metatarsal head fourth Wound Status: Open Wounding Event: Gradually Appeared Comorbid History: Hypertension, Type II Diabetes, Neuropathy Date Acquired: 04/03/2020 Weeks Of Treatment: 5 Clustered Wound: No Photos Wound Measurements Length: (cm) 0.4 Width: (cm) 0.5 Depth: (cm) 0.4 Area: (cm) 0.157 Volume: (cm) 0.063 % Reduction in Area: 50% % Reduction in Volume: 33% Epithelialization: None Tunneling: No Undermining: Yes Starting Position (o'clock): 12 Ending Position (o'clock): 12 Maximum Distance: (cm) 0.3 Wound Description Classification: Grade 2 Wound Margin: Well defined, not attached Exudate Amount: Small Exudate Type: Purulent Exudate Color: yellow, brown, green Foul Odor After Cleansing: No Slough/Fibrino No Wound Bed Granulation Amount: Large (67-100%) Exposed Structure Granulation Quality: Red Fascia Exposed: No Necrotic Amount: Small (1-33%) Fat Layer (Subcutaneous Tissue) Exposed: Yes Necrotic Quality: Adherent Slough Tendon Exposed: No Muscle Exposed: No Joint Exposed: No Bone Exposed: No Treatment Notes Wound #1 (Metatarsal head fourth) Wound Laterality: Right Cleanser [place]ram Ancillary Kit - 15 Day  Supply Discharge Instruction: Use supplies as instructed; Kit contains: (15) Saline Bullets; (15) 3x3 Gauze; 15 pr Gloves Soap and Water Discharge Instruction: May shower and wash wound with dial antibacterial soap and water prior to dressing change. Peri-Wound Care Topical Primary Dressing KerraCel Ag Gelling Fiber Dressing, 2x2 in (silver alginate) Discharge Instruction: Apply silver alginate to wound bed as instructed Secondary Dressing Woven Gauze Sponges 2x2 in Discharge Instruction: Apply over primary dressing as directed. Secured With Conforming Stretch Gauze Bandage, Sterile 2x75 (in/in) Discharge Instruction: Secure with stretch gauze as directed. 45M Medipore H Soft Cloth Surgical Baker ape, 2x2 (in/yd) Discharge Instruction: Secure dressing with tape as directed. Compression Wrap Compression Stockings Add-Ons Electronic Signature(s) Signed: 12/01/2020 5:28:26 PM By: Sandre Kitty Signed: 12/01/2020 5:43:47 PM By: Baruch Gouty RN, BSN Entered By: Sandre Kitty on 12/01/2020 16:37:51 -------------------------------------------------------------------------------- Vitals Details Patient Name: Date of Service: Dustin Baker, Dustin Baker. 12/01/2020 7:30 A M Medical Record Number: [id number] Patient Account Number: [id number] Date of Birth/Sex: Treating RN: 10/19/1970 (50 y.o. Dustin Baker Primary Care Corley Maffeo: Dustin Baker Other Clinician: Referring Marsh Heckler: Treating Arvie Villarruel/Extender: Dustin Baker in Treatment: 5 Vital Signs Time Taken: 07:47 Temperature (F): 98.6 Height (in): 71 Pulse (bpm): 97 Source: Stated Respiratory Rate (breaths/min): 18 Weight (lbs): 224 Blood Pressure (mmHg): 131/85 Source: Stated Reference Range: 80 - 120 mg / dl Body Mass Index (BMI): 31.2 Notes Hgb A1C 9.8 2 weeks ago Electronic Signature(s) Signed: 12/01/2020 5:43:47 PM By: Baruch Gouty RN, BSN Entered By: Baruch Gouty on 12/01/2020  07:48:39

## 2020-12-01 NOTE — Progress Notes (Signed)
Dustin Baker ([id number]) . Visit Report for 12/01/2020 Debridement Details Patient Name: Date of Service: [facility], Dustin Baker [facility] Baker. 12/01/2020 7:30 A M Medical Record Number: [id number] Patient Account Number: [id number] Date of Birth/Sex: Treating RN: 11-09-1970 (50 y.o. Dustin Baker, [name] Primary Care Provider: Leighton Ruff Other Clinician: Referring Provider: Treating Provider/Extender: [name] in Treatment: 5 Debridement Performed for Assessment: Wound #1 Right Metatarsal head fourth Performed By: Physician Dustin Baker., MD Debridement Type: Debridement Severity of Tissue Pre Debridement: Fat layer exposed Level of Consciousness (Pre-procedure): Awake and Alert Pre-procedure Verification/Time Out Yes - 07:55 Taken: Start Time: 07:56 Pain Control: Lidocaine 4% Topical Solution Baker Area Debrided (L x W): otal 1 (cm) x 1 (cm) = 1 (cm) Tissue and other material debrided: Non-Viable, Callus Level: Non-Viable Tissue Debridement Description: Selective/Open Wound Instrument: Curette Bleeding: Minimum Hemostasis Achieved: Pressure End Time: 08:00 Procedural Pain: 0 Post Procedural Pain: 0 Response to Treatment: Procedure was tolerated well Level of Consciousness (Post- Awake and Alert procedure): Post Debridement Measurements of Total Wound Length: (cm) 0.4 Width: (cm) 0.5 Depth: (cm) 0.3 Volume: (cm) 0.047 Character of Wound/Ulcer Post Debridement: Improved Severity of Tissue Post Debridement: Fat layer exposed Post Procedure Diagnosis Same as Pre-procedure Electronic Signature(s) Signed: 12/01/2020 5:43:02 PM By: Linton Ham MD Signed: 12/01/2020 5:47:39 PM By: Deon Pilling Entered By: Linton Ham on 12/01/2020 08:02:48 -------------------------------------------------------------------------------- HPI Details Patient Name: Date of Service: Dustin Baker. 12/01/2020 7:30 A M Medical Record Number: [id number] Patient Account  Number: [id number] Date of Birth/Sex: Treating RN: 06-Jan-1971 (50 y.o. 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 He is here for review of 2 wounds on his plantar feet the first is 1 over the fourth metatarsal head that is been there for about 6 months. He has no idea how this happened and simply saw blood on his sock. He was not doing anything specific to this except putting a dry dressing on the top. 2 weeks ago we will noted another 1 more inferiorly on the lateral plantar foot. Did not seek any medical attention until 2 days ago saw his primary doctor a culture was done he was prescribed Bactrim and given Bactroban topically. Past medical history includes hypercholesterolemia, hypertension, type 2 diabetes with peripheral neuropathy, history of EtOH ABI in our clinic was 0.91 on the right 3/15; this is a 3-week follow-up I am not exactly sure how this happened. This is a patient with a wound on the lateral part of his foot and an area over his fourth metatarsal head. We have been using silver collagen to both of these areas. The area on the lateral foot appears to be healed We gave him a forefoot offloading boot get he comes in in normal shoes he also tells [place] he has been active. Not sure about the adequacy of his offloading and I talked to him about this 3/22; patient came in today with purulent drainage coming out of the wound over the fourth met head on the right foot. A specimen was obtained by her intake nurse for culture. We have been using Iodoflex. The second wound which was just below this on the lateral plantar foot remains closed although it is callused. The patient tells me that he is starting a  new job next Monday and although he works from home he is going to be in training and he cannot miss any time starting at 830. I told him that I do not really control the schedule here but we could try to fit him in at 7;30 3/31; fourth metatarsal head on the left grew MRSA but sensitive to the doxycycline I gave him last week empirically. I am going to extend this by another week. This does not go to bone. X-ray was negative for osteomyelitis. The area on the plantar lateral foot just below the fourth metatarsal head is closed. Electronic Signature(s) Signed: 12/01/2020 5:43:02 PM By: Linton Ham MD Entered By: Linton Ham on 12/01/2020 08:03:39 -------------------------------------------------------------------------------- Physical Exam Details Patient Name: Date of Service: Dustin Baker. 12/01/2020 7:30 A M Medical Record Number: [id number] Patient Account Number: [id number] Date of Birth/Sex: Treating RN: [birth year] (51 y.o. Dustin Baker Primary Care Provider: Leighton Ruff Other Clinician: Referring Provider: Treating Provider/Extender: [name] in Treatment: 5 Constitutional Sitting or standing Blood Pressure is within target range for patient.. Pulse regular and within target range for patient.Marland Kitchen Respirations regular, non-labored and within target range.. Temperature is normal and within the target range for the patient.Marland Kitchen Appears in no distress. Notes Wound exam; the area over the right fourth metatarsal head has callus around this. The wound itself has some depth but otherwise healthy granulation tissue. It does tunnel a bit about 11:00 perhaps half a centimeter. No overt infection no erythema no purulence. I used a #3 curette to remove thick callus around the edge The more proximal wound about an inch below this laterally has callus I removed a bit of this there is no open wound here Electronic Signature(s) Signed: 12/01/2020 5:43:02  PM By: Linton Ham MD Entered By: Linton Ham on 12/01/2020 08:05:03 -------------------------------------------------------------------------------- Physician Orders Details Patient Name: Date of Service: Dustin Baker. 12/01/2020 7:30 A M Medical Record Number: [id number] Patient Account Number: [id number] Date of Birth/Sex: Treating RN: 05/30/1971 (50 y.o. Dustin Baker Primary Care Provider: Leighton Ruff Other Clinician: Referring Provider: Treating Provider/Extender: [name] in Treatment: 5 Verbal / Phone Orders: No Diagnosis Coding Follow-up Appointments Return Appointment in 2 weeks. Bathing/ Shower/ Hygiene May shower with protection but do not get wound dressing(s) wet. - when not changing the dressing. May shower and wash wound with soap and water. - with dressing changes. Edema Control - Lymphedema / SCD / Other Elevate legs to the level of the heart or above for 30 minutes daily and/or when sitting, a frequency of: - 3-4 times day throughout the day. Avoid standing for long periods of time. Moisturize legs daily. - both leg every night before bed. Off-Loading Wedge shoe to: - Patient to wear while walking and standing. only wear regular shoe while driving. Additional Orders / Instructions Other: - check feet every night before bed. Wound Treatment Wound #1 - Metatarsal head fourth Wound Laterality: Right Cleanser: Byram Ancillary Kit - 15 Day Supply (Generic) Every Other Day/30 Days Discharge Instructions: Use supplies as instructed; Kit contains: (15) Saline Bullets; (15) 3x3 Gauze; 15 pr Gloves Cleanser: Soap and Water Every Other Day/30 Days Discharge Instructions: May shower and wash wound with dial antibacterial soap and water prior to dressing change. Prim Dressing: KerraCel Ag Gelling Fiber Dressing, 2x2 in (silver alginate) Every Other Day/30 Days ary Discharge Instructions: Apply silver alginate to wound bed as  instructed Secondary Dressing: Woven Gauze Sponges 2x2  in (Generic) Every Other Day/30 Days Discharge Instructions: Apply over primary dressing as directed. Secured With: Child psychotherapist, Sterile 2x75 (in/in) (Generic) Every Other Day/30 Days Discharge Instructions: Secure with stretch gauze as directed. Secured With: 29M Medipore H Soft Cloth Surgical Tape, 2x2 (in/yd) (Generic) Every Other Day/30 Days Discharge Instructions: Secure dressing with tape as directed. Patient Medications llergies: penicillin A Notifications Medication Indication Start End wound infection 12/01/2020 doxycycline monohydrate DOSE oral 100 mg capsule - 1 capsule oral bid for a further 7 days Electronic Signature(s) Signed: 12/01/2020 8:07:25 AM By: Linton Ham MD Entered By: Linton Ham on 12/01/2020 08:07:25 -------------------------------------------------------------------------------- Problem List Details Patient Name: Date of Service: Dustin Baker. 12/01/2020 7:30 A M Medical Record Number: [id number] Patient Account Number: [id number] Date of Birth/Sex: Treating RN: [birth year] (50 y.o. Dustin Baker Primary Care Provider: Leighton Ruff Other Clinician: Referring Provider: Treating Provider/Extender: [name] in Treatment: 5 Active Problems ICD-10 Encounter Code Description Active Date MDM Diagnosis E11.621 Type 2 diabetes mellitus with foot ulcer 10/27/2020 No Yes L97.512 Non-pressure chronic ulcer of other part of right foot with fat layer exposed 10/27/2020 No Yes E11.42 Type 2 diabetes mellitus with diabetic polyneuropathy 10/27/2020 No Yes L03.115 Cellulitis of right lower limb 11/22/2020 No Yes Inactive Problems Resolved Problems Electronic Signature(s) Signed: 12/01/2020 5:43:02 PM By: Linton Ham MD Entered By: Linton Ham on 12/01/2020  08:02:20 -------------------------------------------------------------------------------- Progress Note Details Patient Name: Date of Service: Dustin Baker. 12/01/2020 7:30 A M Medical Record Number: [id number] Patient Account Number: [id number] Date of Birth/Sex: Treating RN: [birth year] (50 y.o. 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 He is here for review of 2 wounds on his plantar feet the first is 1 over the fourth metatarsal head that is been there for about 6 months. He has no idea how this happened and simply saw blood on his sock. He was not doing anything specific to this except putting a dry dressing on the top. 2 weeks ago we will noted another 1 more inferiorly on the lateral plantar foot. Did not seek any medical attention until 2 days ago saw his primary doctor a culture was done he was prescribed Bactrim and given Bactroban topically. Past medical history includes hypercholesterolemia, hypertension, type 2 diabetes with peripheral neuropathy, history of EtOH ABI in our clinic was 0.91 on the right 3/15; this is a 3-week follow-up I am not exactly sure how this happened. This is a patient with a wound on the lateral part of his foot and an area over his fourth metatarsal head. We have been using silver collagen to both of these areas. The area on the lateral foot appears to be healed We gave him a forefoot offloading boot get he comes in in normal shoes he also tells [place] he has been active. Not sure about the adequacy of his offloading and I talked to him about this 3/22; patient came in today with purulent drainage coming out of the wound over the fourth met head on the right foot. A  specimen was obtained by her intake nurse for culture. We have been using Iodoflex. The second wound which was just below this on the lateral plantar foot remains closed although it is callused. The patient tells  me that he is starting a new job next Monday and although he works from home he is going to be in training and he cannot miss any time starting at 830. I told him that I do not really control the schedule here but we could try to fit him in at 7;30 3/31; fourth metatarsal head on the left grew MRSA but sensitive to the doxycycline I gave him last week empirically. I am going to extend this by another week. This does not go to bone. X-ray was negative for osteomyelitis. The area on the plantar lateral foot just below the fourth metatarsal head is closed. Objective Constitutional Sitting or standing Blood Pressure is within target range for patient.. Pulse regular and within target range for patient.Marland Kitchen Respirations regular, non-labored and within target range.. Temperature is normal and within the target range for the patient.Marland Kitchen Appears in no distress. Vitals Time Taken: 7:47 AM, Height: 71 in, Source: Stated, Weight: 224 lbs, Source: Stated, BMI: 31.2, Temperature: 98.6 F, Pulse: 97 bpm, Respiratory Rate: 18 breaths/min, Blood Pressure: 131/85 mmHg. General Notes: Hgb A1C 9.8 2 weeks ago General Notes: Wound exam; the area over the right fourth metatarsal head has callus around this. The wound itself has some depth but otherwise healthy granulation tissue. It does tunnel a bit about 11:00 perhaps half a centimeter. No overt infection no erythema no purulence. I used a #3 curette to remove thick callus around the edge ooThe more proximal wound about an inch below this laterally has callus I removed a bit of this there is no open wound here Integumentary (Hair, Skin) Wound #1 status is Open. Original cause of wound was Gradually Appeared. The date acquired was: 04/03/2020. The wound has been in  treatment 5 weeks. The wound is located on the Right Metatarsal head fourth. The wound measures 0.4cm length x 0.5cm width x 0.4cm depth; 0.157cm^2 area and 0.063cm^3 volume. There is Fat Layer (Subcutaneous Tissue) exposed. There is no tunneling noted, however, there is undermining starting at 12:00 and ending at 12:00 with a maximum distance of 0.3cm. There is a small amount of purulent drainage noted. The wound margin is well defined and not attached to the wound base. There is large (67-100%) red granulation within the wound bed. There is a small (1-33%) amount of necrotic tissue within the wound bed including Adherent Slough. Assessment Active Problems ICD-10 Type 2 diabetes mellitus with foot ulcer Non-pressure chronic ulcer of other part of right foot with fat layer exposed Type 2 diabetes mellitus with diabetic polyneuropathy Cellulitis of right lower limb Procedures Wound #1 Pre-procedure diagnosis of Wound #1 is a Diabetic Wound/Ulcer of the Lower Extremity located on the Right Metatarsal head fourth .Severity of Tissue Pre Debridement is: Fat layer exposed. There was a Selective/Open Wound Non-Viable Tissue Debridement with a total area of 1 sq cm performed by Dustin Baker., MD. With the following instrument(s): Curette to remove Non-Viable tissue/material. Material removed includes Callus after achieving pain control using Lidocaine 4% Baker opical Solution. A time out was conducted at 07:55, prior to the start of the procedure. A Minimum amount of bleeding was controlled with Pressure. The procedure was tolerated well with a pain level of 0 throughout and a pain level of 0 following the procedure. Post Debridement Measurements: 0.4cm length x 0.5cm width x 0.3cm depth; 0.047cm^3 volume. Character of Wound/Ulcer Post Debridement is improved. Severity of Tissue Post Debridement is: Fat layer exposed. Post procedure Diagnosis Wound #1: Same as Pre-Procedure Plan Follow-up  Appointments: Return Appointment in 2 weeks. Bathing/ Shower/ Hygiene: May shower with protection but do not get wound dressing(s) wet. - when not changing the dressing. May shower and wash wound with soap and water. - with dressing changes. Edema Control - Lymphedema / SCD / Other: Elevate legs to the level of the heart or above for 30 minutes daily and/or when sitting, a frequency of: - 3-4 times day throughout the day. Avoid standing for long periods of time. Moisturize legs daily. - both leg every night before bed. Off-Loading: Wedge shoe to: - Patient to wear while walking and standing. only wear regular shoe while driving. Additional Orders / Instructions: Other: - check feet every night before bed. The following medication(s) was prescribed: doxycycline monohydrate oral 100 mg capsule 1 capsule oral bid for a further 7 days for wound infection starting 12/01/2020 WOUND #1: - Metatarsal head fourth Wound Laterality: Right Cleanser: Byram Ancillary Kit - 15 Day Supply (Generic) Every Other Day/30 Days Discharge Instructions: Use supplies as instructed; Kit contains: (15) Saline Bullets; (15) 3x3 Gauze; 15 pr Gloves Cleanser: Soap and Water Every Other Day/30 Days Discharge Instructions: May shower and wash wound with dial antibacterial soap and water prior to dressing change. Prim Dressing: KerraCel Ag Gelling Fiber Dressing, 2x2 in (silver alginate) Every Other Day/30 Days ary Discharge Instructions: Apply silver alginate to wound bed as instructed Secondary Dressing: Woven Gauze Sponges 2x2 in (Generic) Every Other Day/30 Days Discharge Instructions: Apply over primary dressing as directed. Secured With: Child psychotherapist, Sterile 2x75 (in/in) (Generic) Every Other Day/30 Days Discharge Instructions: Secure with stretch gauze as directed. Secured With: 56M Medipore H Soft Cloth Surgical Baker ape, 2x2 (in/yd) (Generic) Every Other Day/30 Days Discharge Instructions: Secure  dressing with tape as directed. #1 silver alginate 2. Follow-up in 2 weeks. The patient has a job orientation for his job from home nevertheless has to be on the computer at 830 3. I am going to extend his doxycycline for a further weekooMRSA 4. At this point I am not going to MRI his foot I am going to give this wound a chance to declare itself is improving it looks quite good today. 5. I preached a very rigorous offloading. He has a forefoot off loader but I never see it when he comes in here Electronic Signature(s) Signed: 12/01/2020 8:07:47 AM By: Linton Ham MD Entered By: Linton Ham on 12/01/2020 08:07:46 -------------------------------------------------------------------------------- SuperBill Details Patient Name: Date of Service: Dustin Baker. 12/01/2020 Medical Record Number: [id number] Patient Account Number: [id number] Date of Birth/Sex: Treating RN: August 03, 1971 (50 y.o. Dustin Baker, [name] Primary Care Provider: Leighton Ruff Other Clinician: Referring Provider: Treating Provider/Extender: [name] in Treatment: 5 Diagnosis Coding ICD-10 Codes Code Description [phone] Type 2 diabetes mellitus with foot ulcer L97.512 Non-pressure chronic ulcer of other part of right foot with fat layer exposed E11.42 Type 2 diabetes mellitus with diabetic polyneuropathy L03.115 Cellulitis of right lower limb Facility Procedures CPT4 Code: [id number] Description: [id number] - DEBRIDE WOUND 1ST 20 SQ CM OR < ICD-10 Diagnosis Description L97.512 Non-pressure chronic ulcer of other part of right foot with fat layer exposed E11.621 Type 2 diabetes mellitus with foot ulcer Modifier: Quantity: 1 Physician Procedures : CPT4 Code Description Modifier [id number] - WC PHYS DEBR WO ANESTH 20 SQ CM ICD-10 Diagnosis Description L97.512 Non-pressure chronic ulcer of other part of right foot with fat layer exposed E11.621 Type 2 diabetes mellitus with foot  ulcer Quantity: 1 Electronic  Signature(s) Signed: 12/01/2020 5:43:02 PM By: Linton Ham MD Entered By: Linton Ham on 12/01/2020 08:09:06

## 2020-12-15 ENCOUNTER — Other Ambulatory Visit: Payer: Self-pay

## 2020-12-15 ENCOUNTER — Encounter (HOSPITAL_BASED_OUTPATIENT_CLINIC_OR_DEPARTMENT_OTHER): Payer: BLUE CROSS/BLUE SHIELD | Attending: Internal Medicine | Admitting: Internal Medicine

## 2020-12-15 DIAGNOSIS — E11621 Type 2 diabetes mellitus with foot ulcer: Secondary | ICD-10-CM | POA: Diagnosis not present

## 2020-12-15 DIAGNOSIS — E1142 Type 2 diabetes mellitus with diabetic polyneuropathy: Secondary | ICD-10-CM | POA: Insufficient documentation

## 2020-12-15 DIAGNOSIS — L03115 Cellulitis of right lower limb: Secondary | ICD-10-CM | POA: Diagnosis not present

## 2020-12-15 DIAGNOSIS — I10 Essential (primary) hypertension: Secondary | ICD-10-CM | POA: Insufficient documentation

## 2020-12-15 DIAGNOSIS — E1151 Type 2 diabetes mellitus with diabetic peripheral angiopathy without gangrene: Secondary | ICD-10-CM | POA: Insufficient documentation

## 2020-12-15 DIAGNOSIS — L97512 Non-pressure chronic ulcer of other part of right foot with fat layer exposed: Secondary | ICD-10-CM | POA: Diagnosis not present

## 2020-12-15 NOTE — Progress Notes (Signed)
Dustin Baker, Dustin Baker ([id number]) . Visit Report for 12/15/2020 HPI Details Patient Name: Date of Service: [name], [name] Baker. 12/15/2020 7:30 A M Medical Record Number: [id number] Patient Account Number: [id number] Date of Birth/Sex: Treating RN: 01/03/1971 (50 y.o. 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 He is here for review of 2 wounds on his plantar feet the first is 1 over the fourth metatarsal head that is been there for about 6 months. He has no idea how this happened and simply saw blood on his sock. He was not doing anything specific to this except putting a dry dressing on the top. 2 weeks ago we will noted another 1 more inferiorly on the lateral plantar foot. Did not seek any medical attention until 2 days ago saw his primary doctor a culture was done he was prescribed Bactrim and given Bactroban topically. Past medical history includes hypercholesterolemia, hypertension, type 2 diabetes with peripheral neuropathy, history of EtOH ABI in our clinic was 0.91 on the right 3/15; this is a 3-week follow-up I am not exactly sure how this happened. This is a patient with a wound on the lateral part of his foot and an area over his fourth metatarsal head. We have been using silver collagen to both of these areas. The area on the lateral foot appears to be healed We gave him a forefoot offloading boot get he comes in in normal shoes he also tells [place] he has been active. Not sure about the adequacy of his offloading and I talked to him about this 3/22; patient came in today with purulent drainage coming out of the wound over the fourth met head on the right foot. A specimen was obtained by her intake nurse  for culture. We have been using Iodoflex. The second wound which was just below this on the lateral plantar foot remains closed although it is callused. The patient tells me that he is starting a new job next Monday and although he works from home he is going to be in training and he cannot miss any time starting at 830. I told him that I do not really control the schedule here but we could try to fit him in at 7;30 3/31; fourth metatarsal head on the left grew MRSA but sensitive to the doxycycline I gave him last week empirically. I am going to extend this by another week. This does not go to bone. X-ray was negative for osteomyelitis. The area on the plantar lateral foot just below the fourth metatarsal head is closed. 4/14; fourth right metatarsal head. Diabetic foot ulcer with neuropathy. This is closed today. He has completed his doxycycline. There is no evidence of infection Electronic Signature(s) Signed: 12/15/2020 5:23:41 PM By: Linton Ham MD Entered By: Linton Ham on 12/15/2020 08:13:13 -------------------------------------------------------------------------------- Physical Exam Details Patient Name: Date of Service: [name], [name] 12/15/2020 7:30 A M Medical Record Number: [id number] Patient Account Number: [id number] Date of Birth/Sex: Treating RN: 1970-10-11 (50 y.o. [name] Primary Care Provider: Leighton Ruff Other Clinician: Referring Provider: Treating Provider/Extender: [name] in Treatment: 7 Constitutional Patient is hypertensive.. Pulse regular and within target range for patient.Marland Kitchen Respirations regular, non-labored and within target range.. Temperature is normal and within the target range for the  patient.Marland Kitchen Appears in no distress. Notes Wound exam; the area over the right fourth metatarsal head has some callus but no evidence of an underlying wound there is no erythema. No debridement is necessary. Electronic  Signature(s) Signed: 12/15/2020 5:23:41 PM By: Linton Ham MD Entered By: Linton Ham on 12/15/2020 08:13:52 -------------------------------------------------------------------------------- Physician Orders Details Patient Name: Date of Service: [name], [name] 12/15/2020 7:30 A M Medical Record Number: [id number] Patient Account Number: [id number] Date of Birth/Sex: Treating RN: 1971-02-26 (50 y.o. [name], [name] Primary Care Provider: Leighton Ruff Other Clinician: Referring Provider: Treating Provider/Extender: [name] in Treatment: 7 Verbal / Phone Orders: No Diagnosis Coding Follow-up Appointments Other: - No need to follow up!!:) Call us if you have any concerns!!:) Discharge From [facility] Discharge from [place] Signature(s) Signed: 12/15/2020 5:23:41 PM By: Linton Ham MD Signed: 12/15/2020 5:45:48 PM By: Rhae Hammock RN Entered By: Rhae Hammock on 12/15/2020 07:58:28 -------------------------------------------------------------------------------- Problem List Details Patient Name: Date of Service: [name], [name] 12/15/2020 7:30 A M Medical Record Number: [id number] Patient Account Number: [id number] Date of Birth/Sex: Treating RN: Jan 03, 1971 (50 y.o. [name], [name] Primary Care Provider: Leighton Ruff Other Clinician: Referring Provider: Treating Provider/Extender: [name] in Treatment: 7 Active Problems ICD-10 Encounter Code Description Active Date MDM Diagnosis E11.621 Type 2 diabetes mellitus with foot ulcer 10/27/2020 No Yes L97.512 Non-pressure chronic ulcer of other part of right foot with fat layer exposed 10/27/2020 No Yes E11.42 Type 2 diabetes mellitus with diabetic polyneuropathy 10/27/2020 No Yes L03.115 Cellulitis of right lower limb 11/22/2020 No Yes Inactive Problems Resolved Problems Electronic  Signature(s) Signed: 12/15/2020 5:23:41 PM By: Linton Ham MD Entered By: Linton Ham on 12/15/2020 08:11:05 -------------------------------------------------------------------------------- Progress Note Details Patient Name: Date of Service: [name], [name] 12/15/2020 7:30 A M Medical Record Number: [id number] Patient Account Number: [id number] Date of Birth/Sex: Treating RN: 1970/12/03 (49 y.o. 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 He is here for review of 2 wounds on his plantar feet the first is 1 over the fourth metatarsal head that is been there for about 6 months. He has no idea how this happened and simply saw blood on his sock. He was not doing anything specific to this except putting a dry dressing on the top. 2 weeks ago we will noted another 1 more inferiorly on the lateral plantar foot. Did not seek any medical attention until 2 days ago saw his primary doctor a culture was done he was prescribed Bactrim and given Bactroban topically. Past medical history includes hypercholesterolemia, hypertension, type 2 diabetes with peripheral neuropathy, history of EtOH ABI in our clinic was 0.91 on the right 3/15; this is a 3-week follow-up I am not exactly sure how this happened. This is a patient with a wound on the lateral part of his foot and an area over his fourth metatarsal head. We have been using silver collagen to both of these areas. The area on the lateral foot appears to be healed We gave him a forefoot offloading boot get he comes in in normal shoes he also tells [place] he has been active. Not sure about the adequacy of his offloading and I talked to him about this  3/22;  patient came in today with purulent drainage coming out of the wound over the fourth met head on the right foot. A specimen was obtained by her intake nurse for culture. We have been using Iodoflex. The second wound which was just below this on the lateral plantar foot remains closed although it is callused. The patient tells me that he is starting a new job next Monday and although he works from home he is going to be in training and he cannot miss any time starting at 830. I told him that I do not really control the schedule here but we could try to fit him in at 7;30 3/31; fourth metatarsal head on the left grew MRSA but sensitive to the doxycycline I gave him last week empirically. I am going to extend this by another week. This does not go to bone. X-ray was negative for osteomyelitis. The area on the plantar lateral foot just below the fourth metatarsal head is closed. 4/14; fourth right metatarsal head. Diabetic foot ulcer with neuropathy. This is closed today. He has completed his doxycycline. There is no evidence of infection Objective Constitutional Patient is hypertensive.. Pulse regular and within target range for patient.Marland Kitchen Respirations regular, non-labored and within target range.. Temperature is normal and within the target range for the patient.Marland Kitchen Appears in no distress. Vitals Time Taken: 7:46 AM, Height: 71 in, Source: Stated, Weight: 224 lbs, Source: Stated, BMI: 31.2, Temperature: 98 F, Pulse: 76 bpm, Respiratory Rate: 18 breaths/min, Blood Pressure: 146/98 mmHg. General Notes: Wound exam; the area over the right fourth metatarsal head has some callus but no evidence of an underlying wound there is no erythema. No debridement is necessary. Integumentary (Hair, Skin) Wound #1 status is Open. Original cause of wound was Gradually Appeared. The date acquired was: 04/03/2020. The wound has been in treatment 7 weeks. The wound is located on the Right Metatarsal head fourth. The wound  measures 0cm length x 0cm width x 0cm depth; 0cm^2 area and 0cm^3 volume. There is no tunneling or undermining noted. There is a none present amount of drainage noted. There is no granulation within the wound bed. There is no necrotic tissue within the wound bed. Assessment Active Problems ICD-10 Type 2 diabetes mellitus with foot ulcer Non-pressure chronic ulcer of other part of right foot with fat layer exposed Type 2 diabetes mellitus with diabetic polyneuropathy Cellulitis of right lower limb Plan Follow-up Appointments: Other: - No need to follow up!!:) Call us if you have any concerns!!:) Discharge From [facility] Services: Discharge from [facility] #1 the patient can be discharged from the wound care center. This is area is completely closed 2. We talked about insoles in his shoes and offloading this area with callus pads foam gauze etc. Week 3. We also talked about frequent obsessive inspection of his feet for new calluses blisters erythema etc. 4. I am concerned about the possibility of treatment failure here. We gave him forefoot off loader's I never saw him in these same diet nondiabetic slip on shoes. Hopefully I am over worrying about this Electronic Signature(s) Signed: 12/15/2020 5:23:41 PM By: Linton Ham MD Entered By: Linton Ham on 12/15/2020 08:15:16 -------------------------------------------------------------------------------- SuperBill Details Patient Name: Date of Service: [name], [name] 12/15/2020 Medical Record Number: [id number] Patient Account Number: [id number] Date of Birth/Sex: Treating RN: 1971/01/28 (50 y.o. [name], [name] Primary Care Provider: Leighton Ruff Other Clinician: Referring Provider: Treating Provider/Extender: [name] in Treatment: 7 Diagnosis Coding  ICD-10 Codes Code Description E11.621 Type 2 diabetes mellitus with foot ulcer L97.512 Non-pressure chronic ulcer of other part of  right foot with fat layer exposed E11.42 Type 2 diabetes mellitus with diabetic polyneuropathy L03.115 Cellulitis of right lower limb Facility Procedures CPT4 Code: [id number] Description: 99213 - WOUND CARE VISIT-LEV 3 EST PT Modifier: Quantity: 1 Physician Procedures : CPT4 Code Description Modifier [id number] - WC PHYS LEVEL 3 - EST PT ICD-10 Diagnosis Description E11.621 Type 2 diabetes mellitus with foot ulcer L97.512 Non-pressure chronic ulcer of other part of right foot with fat layer exposed E11.42 Type 2  diabetes mellitus with diabetic polyneuropathy Quantity: 1 Electronic Signature(s) Signed: 12/15/2020 5:23:41 PM By: Linton Ham MD Entered By: Linton Ham on 12/15/2020 08:15:33

## 2020-12-15 NOTE — Progress Notes (Signed)
Dustin Baker, PERRIER T ([id number]) . Visit Report for 12/15/2020 Arrival Information Details Patient Name: Date of Service: Dustin Baker, [name] [place] T. 12/15/2020 7:30 A M Medical Record Number: [id number] Patient Account Number: [id number] Date of Birth/Sex: Treating RN: 1970-12-18 (50 y.o. [name], [name] Primary Care [name]: [name] Other Clinician: Referring Eldredge Veldhuizen: Treating Tashauna Caisse/Extender: Arelia Longest in Treatment: 7 Visit Information History Since Last Visit Added or deleted any medications: No Patient Arrived: Ambulatory Any new allergies or adverse reactions: No Arrival Time: 07:44 Had a fall or experienced change in No Accompanied By: self activities of daily living that may affect Transfer Assistance: None risk of falls: Patient Identification Verified: Yes Signs or symptoms of abuse/neglect since last visito No Secondary Verification Process Completed: Yes Hospitalized since last visit: No Patient Requires Transmission-Based Precautions: No Implantable device outside of the clinic excluding No Patient Has Alerts: No cellular tissue based products placed in the center since last visit: Has Dressing in Place as Prescribed: Yes Pain Present Now: No Electronic Signature(s) Signed: 12/15/2020 1:41:00 PM By: Zenaida Deed RN, BSN Entered By: Zenaida Deed on 12/15/2020 07:45:11 -------------------------------------------------------------------------------- Clinic Level of Care Assessment Details Patient Name: Date of Service: [facility] Tenaya Baker, Dustin HN T. 12/15/2020 7:30 A M Medical Record Number: [id number] Patient Account Number: [id number] Date of Birth/Sex: Treating RN: 04-Nov-1970 (50 y.o. Dustin Baker, [name] Primary Care Ercell Perlman: [name] Other Clinician: Referring Dustin Baker: Treating Dustin Baker/Extender: Arelia Longest in Treatment: 7 Clinic Level of Care Assessment Items TOOL 4 Quantity Score X- 1 0 Use  when only an EandM is performed on FOLLOW-UP visit ASSESSMENTS - Nursing Assessment / Reassessment X- 1 10 Reassessment of Co-morbidities (includes updates in patient status) X- 1 5 Reassessment of Adherence to Treatment Plan ASSESSMENTS - Wound and Skin A ssessment / Reassessment X - Simple Wound Assessment / Reassessment - one wound 1 5 []  - 0 Complex Wound Assessment / Reassessment - multiple wounds []  - 0 Dermatologic / Skin Assessment (not related to wound area) ASSESSMENTS - Focused Assessment X- 1 5 Circumferential Edema Measurements - multi extremities X- 1 10 Nutritional Assessment / Counseling / Intervention []  - 0 Lower Extremity Assessment (monofilament, tuning fork, pulses) []  - 0 Peripheral Arterial Disease Assessment (using hand held doppler) ASSESSMENTS - Ostomy and/or Continence Assessment and Care []  - 0 Incontinence Assessment and Management []  - 0 Ostomy Care Assessment and Management (repouching, etc.) PROCESS - Coordination of Care X - Simple Patient / Family Education for ongoing care 1 15 []  - 0 Complex (extensive) Patient / Family Education for ongoing care X- 1 10 Staff obtains , Records, T Results / Process Orders est []  - 0 Staff telephones HHA, Nursing Homes / Clarify orders / etc []  - 0 Routine Transfer to another Facility (non-emergent condition) []  - 0 Routine Hospital Admission (non-emergent condition) []  - 0 New Admissions / / Ordering NPWT Apligraf, etc. , []  - 0 Emergency Hospital Admission (emergent condition) X- 1 10 Simple Discharge Coordination []  - 0 Complex (extensive) Discharge Coordination PROCESS - Special Needs []  - 0 Pediatric / Minor Patient Management []  - 0 Isolation Patient Management []  - 0 Hearing / Language / Visual special needs []  - 0 Assessment of Community assistance (transportation, D/C planning, etc.) []  - 0 Additional assistance / Altered mentation []  - 0 Support  Surface(s) Assessment (bed, cushion, seat, etc.) INTERVENTIONS - Wound Cleansing / Measurement X - Simple Wound Cleansing - one wound 1 5 []  - 0 Complex  Wound Cleansing - multiple wounds X- 1 5 Wound Imaging (photographs - any number of wounds) []  - 0 Wound Tracing (instead of photographs) X- 1 5 Simple Wound Measurement - one wound []  - 0 Complex Wound Measurement - multiple wounds INTERVENTIONS - Wound Dressings []  - 0 Small Wound Dressing one or multiple wounds []  - 0 Medium Wound Dressing one or multiple wounds []  - 0 Large Wound Dressing one or multiple wounds []  - 0 Application of Medications - topical []  - 0 Application of Medications - injection INTERVENTIONS - Miscellaneous []  - 0 External ear exam []  - 0 Specimen Collection (cultures, biopsies, blood, body fluids, etc.) []  - 0 Specimen(s) / Culture(s) sent or taken to Lab for analysis []  - 0 Patient Transfer (multiple staff / / Similar devices) []  - 0 Simple Staple / Suture removal (25 or less) []  - 0 Complex Staple / Suture removal (26 or more) []  - 0 Hypo / Hyperglycemic Management (close monitor of Blood Glucose) []  - 0 Ankle / Brachial Index (ABI) - do not check if billed separately X- 1 5 Vital Signs Has the patient been seen at the hospital within the last three years: Yes Total Score: 90 Level Of Care: New/Established - Level 3 Electronic Signature(s) Signed: 12/15/2020 5:45:48 PM By: RN Entered By: on 12/15/2020 08:01:33 -------------------------------------------------------------------------------- Lower Extremity Assessment Details Patient Name: Date of Service: , Dustin HN T. 12/15/2020 7:30 A M Medical Record Number: Patient Account Number: Date of Birth/Sex: Treating RN: 11/14/1970 (50 y.o. Nurse, adult Primary Care Kaylem Gidney: Other Clinician: Referring Azarion Hove: Treating Dustin Baker/Extender:  in Treatment: 7 Edema Assessment Assessed: [Left: No] [Right: No] Edema: [Left: N] [Right: o] Calf Left: Right: Point of Measurement: 34 cm From Medial Instep 38.5 cm Ankle Left: Right: Point of Measurement: 13 cm From Medial Instep 22 cm Vascular Assessment Pulses: Dorsalis Pedis Palpable: [Right:Yes] Electronic Signature(s) Signed: 12/15/2020 1:41:00 PM By: RN, BSN Entered By: 12/17/2020 on 12/15/2020 07:48:39 -------------------------------------------------------------------------------- Multi Wound Chart Details Patient Name: Date of Service: Fonnie Mu, Dustin HN T. 12/15/2020 7:30 A M Medical Record Number: [name] Patient Account Number: 12/17/2020 Date of Birth/Sex: Treating RN: April 13, 1971 (50 y.o. [id number], [name] Primary Care Odette Watanabe: 10/18/1970 Other Clinician: Referring Mischell Branford: Treating Malak Duchesneau/Extender: 52 in Treatment: 7 Vital Signs Height(in): 71 Pulse(bpm): 76 Weight(lbs): 224 Blood Pressure(mmHg): 146/98 Body Mass Index(BMI): 31 Temperature(F): 98 Respiratory Rate(breaths/min): 18 Photos: [1:No Photos Right Metatarsal head fourth] [N/A:N/A N/A] Wound Location: [1:Gradually Appeared] [N/A:N/A] Wounding Event: [1:Diabetic Wound/Ulcer of the Lower] [N/A:N/A] Primary Etiology: [1:Extremity Hypertension, Type II Diabetes,] [N/A:N/A] Comorbid History: [1:Neuropathy 04/03/2020] [N/A:N/A] Date Acquired: [1:7] [N/A:N/A] Weeks of Treatment: [1:Open] [N/A:N/A] Wound Status: [1:0x0x0] [N/A:N/A] Measurements L x W x D (cm) [1:0] [N/A:N/A] A (cm) : rea [1:0] [N/A:N/A] Volume (cm) : [1:100.00%] [N/A:N/A] % Reduction in A rea: [1:100.00%] [N/A:N/A] % Reduction in Volume: [1:Grade 2] [N/A:N/A] Classification: [1:None Present] [N/A:N/A] Exudate A mount: [1:None Present (0%)] [N/A:N/A] Granulation A mount: [1:None Present (0%)] [N/A:N/A] Necrotic A  mount: [1:Fascia: No] [N/A:N/A] Exposed Structures: [1:Fat Layer (Subcutaneous Tissue): No Tendon: No Muscle: No Joint: No Bone: No Large (67-100%)] [N/A:N/A] Treatment Notes Electronic Signature(s) Signed: 12/15/2020 5:23:41 PM By: Arelia Longest MD Signed: 12/15/2020 5:45:48 PM By: Zenaida Deed RN Entered By: Zenaida Deed on 12/15/2020 08:11:12 -------------------------------------------------------------------------------- Multi-Disciplinary Care Plan Details Patient Name: Date of Service: [name] Baker, Dustin HN T. 12/15/2020 7:30  A M Medical Record Number: [id number] Patient Account Number: [id number] Date of Birth/Sex: Treating RN: 1971/04/06 (50 y.o. Dustin Baker, [name] Primary Care Britanie Harshman: [name] Other Clinician: Referring Laylia Mui: Treating Makye Radle/Extender: Arelia Longest in Treatment: 7 Active Inactive Electronic Signature(s) Signed: 12/15/2020 5:45:48 PM By: Fonnie Mu RN Entered By: Fonnie Mu on 12/15/2020 07:59:26 -------------------------------------------------------------------------------- Pain Assessment Details Patient Name: Date of Service: [name], Dustin HN T. 12/15/2020 7:30 A M Medical Record Number: [id number] Patient Account Number: [id number] Date of Birth/Sex: Treating RN: 1970-11-01 (50 y.o. Damaris Schooner Primary Care Margarite Vessel: Other Clinician: [name] Referring Zayaan Kozak: Treating Primrose Oler/Extender: Arelia Longest in Treatment: 7 Active Problems Location of Pain Severity and Description of Pain Patient Has Paino No Site Locations Rate the pain. Current Pain Level: 0 Pain Management and Medication Current Pain Management: Electronic Signature(s) Signed: 12/15/2020 1:41:00 PM By: Zenaida Deed RN, BSN Entered By: Zenaida Deed on 12/15/2020 07:47:44 -------------------------------------------------------------------------------- Patient/Caregiver  Education Details Patient Name: Date of Service: [name], Marinus Maw 4/14/2022andnbsp7:30 A M Medical Record Number: [id number] Patient Account Number: [id number] Date of Birth/Gender: Treating RN: 1971-03-16 (50 y.o. Lucious Groves Primary Care Physician: [name] Other Clinician: Referring Physician: Treating Physician/Extender: Arelia Longest in Treatment: 7 Education Assessment Education Provided To: Patient Education Topics Provided Wound/Skin Impairment: Methods: Explain/Verbal Responses: State content correctly Electronic Signature(s) Signed: 12/15/2020 5:45:48 PM By: Fonnie Mu RN Entered By: Fonnie Mu on 12/15/2020 07:59:37 -------------------------------------------------------------------------------- Wound Assessment Details Patient Name: Date of Service: [name], Dustin HN T. 12/15/2020 7:30 A M Medical Record Number: [id number] Patient Account Number: [id number] Date of Birth/Sex: Treating RN: Jan 17, 1971 (50 y.o. Damaris Schooner Primary Care Allina Riches: [name] Other Clinician: Referring Kaylyne Axton: Treating Jonnie Truxillo/Extender: Arelia Longest in Treatment: 7 Wound Status Wound Number: 1 Primary Etiology: Diabetic Wound/Ulcer of the Lower Extremity Wound Location: Right Metatarsal head fourth Wound Status: Open Wounding Event: Gradually Appeared Comorbid History: Hypertension, Type II Diabetes, Neuropathy Date Acquired: 04/03/2020 Weeks Of Treatment: 7 Clustered Wound: No Wound Measurements Length: (cm) Width: (cm) Depth: (cm) Area: (cm) Volume: (cm) 0 % Reduction in Area: 100% 0 % Reduction in Volume: 100% 0 Epithelialization: Large (67-100%) 0 Tunneling: No 0 Undermining: No Wound Description Classification: Grade 2 Exudate Amount: None Present Foul Odor After Cleansing: No Slough/Fibrino No Wound Bed Granulation Amount: None Present (0%) Exposed  Structure Necrotic Amount: None Present (0%) Fascia Exposed: No Fat Layer (Subcutaneous Tissue) Exposed: No Tendon Exposed: No Muscle Exposed: No Joint Exposed: No Bone Exposed: No Electronic Signature(s) Signed: 12/15/2020 1:41:00 PM By: Zenaida Deed RN, BSN Entered By: Zenaida Deed on 12/15/2020 07:49:53 -------------------------------------------------------------------------------- Vitals Details Patient Name: Date of Service: [name], Dustin HN T. 12/15/2020 7:30 A M Medical Record Number: [id number] Patient Account Number: [id number] Date of Birth/Sex: Treating RN: Jan 29, 1971 (50 y.o. Damaris Schooner Primary Care Ygnacio Fecteau: [name] Other Clinician: Referring Sahaana Weitman: Treating Azuree Minish/Extender: Arelia Longest in Treatment: 7 Vital Signs Time Taken: 07:46 Temperature (F): 98 Height (in): 71 Pulse (bpm): 76 Source: Stated Respiratory Rate (breaths/min): 18 Weight (lbs): 224 Blood Pressure (mmHg): 146/98 Source: Stated Reference Range: 80 - 120 mg / dl Body Mass Index (BMI): 31.2 Electronic Signature(s) Signed: 12/15/2020 1:41:00 PM By: Zenaida Deed RN, BSN Entered By: Zenaida Deed on 12/15/2020 07:47:06

## 2021-02-22 ENCOUNTER — Other Ambulatory Visit: Payer: Self-pay

## 2021-02-22 ENCOUNTER — Encounter (HOSPITAL_COMMUNITY): Payer: Self-pay | Admitting: Emergency Medicine

## 2021-02-22 ENCOUNTER — Inpatient Hospital Stay (HOSPITAL_COMMUNITY)
Admission: EM | Admit: 2021-02-22 | Discharge: 2021-02-25 | DRG: 603 | Disposition: A | Discharge status: Home / Self Care | Payer: 59 | Attending: Family Medicine | Admitting: Family Medicine

## 2021-02-22 DIAGNOSIS — Z833 Family history of diabetes mellitus: Secondary | ICD-10-CM

## 2021-02-22 DIAGNOSIS — L03311 Cellulitis of abdominal wall: Principal | ICD-10-CM | POA: Diagnosis present

## 2021-02-22 DIAGNOSIS — L0390 Cellulitis, unspecified: Secondary | ICD-10-CM | POA: Diagnosis present

## 2021-02-22 DIAGNOSIS — Z7984 Long term (current) use of oral hypoglycemic drugs: Secondary | ICD-10-CM

## 2021-02-22 DIAGNOSIS — L0292 Furuncle, unspecified: Secondary | ICD-10-CM | POA: Diagnosis present

## 2021-02-22 DIAGNOSIS — E7800 Pure hypercholesterolemia, unspecified: Secondary | ICD-10-CM | POA: Diagnosis present

## 2021-02-22 DIAGNOSIS — R911 Solitary pulmonary nodule: Secondary | ICD-10-CM | POA: Diagnosis present

## 2021-02-22 DIAGNOSIS — I10 Essential (primary) hypertension: Secondary | ICD-10-CM | POA: Diagnosis present

## 2021-02-22 DIAGNOSIS — Z881 Allergy status to other antibiotic agents status: Secondary | ICD-10-CM

## 2021-02-22 DIAGNOSIS — Z880 Allergy status to penicillin: Secondary | ICD-10-CM

## 2021-02-22 DIAGNOSIS — Z794 Long term (current) use of insulin: Secondary | ICD-10-CM

## 2021-02-22 DIAGNOSIS — E119 Type 2 diabetes mellitus without complications: Secondary | ICD-10-CM | POA: Diagnosis present

## 2021-02-22 DIAGNOSIS — L03319 Cellulitis of trunk, unspecified: Secondary | ICD-10-CM | POA: Diagnosis not present

## 2021-02-22 DIAGNOSIS — Z79899 Other long term (current) drug therapy: Secondary | ICD-10-CM

## 2021-02-22 DIAGNOSIS — Z20822 Contact with and (suspected) exposure to covid-19: Secondary | ICD-10-CM | POA: Diagnosis present

## 2021-02-22 DIAGNOSIS — Z8614 Personal history of Methicillin resistant Staphylococcus aureus infection: Secondary | ICD-10-CM

## 2021-02-22 LAB — COMPREHENSIVE METABOLIC PANEL
ALT: 25 U/L (ref 0–44)
AST: 18 U/L (ref 15–41)
Albumin: 4.3 g/dL (ref 3.5–5.0)
Alkaline Phosphatase: 43 U/L (ref 38–126)
Anion gap: 12 (ref 5–15)
BUN: 15 mg/dL (ref 6–20)
CO2: 23 mmol/L (ref 22–32)
Calcium: 9.6 mg/dL (ref 8.9–10.3)
Chloride: 97 mmol/L — ABNORMAL LOW (ref 98–111)
Creatinine, Ser: 0.92 mg/dL (ref 0.61–1.24)
GFR, Estimated: >60 mL/min (ref >60)
Glucose, Bld: 274 mg/dL — ABNORMAL HIGH (ref 70–99)
Potassium: 4.1 mmol/L (ref 3.5–5.1)
Sodium: 132 mmol/L — ABNORMAL LOW (ref 135–145)
Total Bilirubin: 0.9 mg/dL (ref 0.3–1.2)
Total Protein: 8 g/dL (ref 6.5–8.1)

## 2021-02-22 LAB — URINALYSIS, ROUTINE W REFLEX MICROSCOPIC
Bacteria, UA: NONE SEEN
Bilirubin Urine: NEGATIVE
Glucose, UA: >=500 mg/dL — AB
Ketones, ur: 20 mg/dL — AB
Leukocytes,Ua: NEGATIVE
Nitrite: NEGATIVE
Protein, ur: NEGATIVE mg/dL
Specific Gravity, Urine: 1.036 — ABNORMAL HIGH (ref 1.005–1.030)
pH: 6 (ref 5.0–8.0)

## 2021-02-22 LAB — CBC WITH DIFFERENTIAL/PLATELET
Abs Immature Granulocytes: 0.06 10*3/uL (ref 0.00–0.07)
Basophils Absolute: 0.1 10*3/uL (ref 0.0–0.1)
Basophils Relative: 1 %
Eosinophils Absolute: 0.1 10*3/uL (ref 0.0–0.5)
Eosinophils Relative: 0 %
HCT: 44.8 % (ref 39.0–52.0)
Hemoglobin: 15.6 g/dL (ref 13.0–17.0)
Immature Granulocytes: 0 %
Lymphocytes Relative: 11 %
Lymphs Abs: 1.8 10*3/uL (ref 0.7–4.0)
MCH: 30.5 pg (ref 26.0–34.0)
MCHC: 34.8 g/dL (ref 30.0–36.0)
MCV: 87.7 fL (ref 80.0–100.0)
Monocytes Absolute: 1 10*3/uL (ref 0.1–1.0)
Monocytes Relative: 6 %
Neutro Abs: 13.1 10*3/uL — ABNORMAL HIGH (ref 1.7–7.7)
Neutrophils Relative %: 82 %
Platelets: 213 10*3/uL (ref 150–400)
RBC: 5.11 MIL/uL (ref 4.22–5.81)
RDW: 11.9 % (ref 11.5–15.5)
WBC: 16 10*3/uL — ABNORMAL HIGH (ref 4.0–10.5)
nRBC: 0 % (ref 0.0–0.2)

## 2021-02-22 LAB — LACTIC ACID, PLASMA: Lactic Acid, Venous: 1.8 mmol/L (ref 0.5–1.9)

## 2021-02-22 LAB — LIPASE, BLOOD: Lipase: 31 U/L (ref 11–51)

## 2021-02-22 MED ORDER — ACETAMINOPHEN 500 MG PO TABS
1000.0000 mg | ORAL_TABLET | Freq: Once | ORAL | Status: AC
Start: 2021-02-22 — End: 2021-02-22
  Administered 2021-02-22: 1000 mg via ORAL
  Filled 2021-02-22: qty 2

## 2021-02-22 NOTE — ED Provider Notes (Signed)
Emergency Medicine Provider Triage Evaluation Note  Dustin Baker 50 y.o. male was evaluated in triage.  Pt complains of presents for evaluation of fevers, area of redness and warmth noted to the lower abdomen/groin area.  He states that he had a sore on his lower abdomen/groin area that began a few days ago.  He states that he does not know if he got bit by an insect or if what occurred to cause it.  He states it is gotten more hard, more red, more swollen.  He has not noted any drainage.  Today he started having fever.  He has history of diabetes.  He is on metformin.  He denies any abdominal pain, nausea/vomiting, nasal congestion, rhinorrhea, chest pain, difficulty breathing, dysuria, hematuria.   Review of Systems  Positive: Fever, skin changes Negative:   Physical Exam  BP 134/82   Pulse 70   Temp 98.2 F (36.8 C) (Oral)   Resp 18   Ht 5\' 4"  (1.626 m)   Wt 65.8 kg   SpO2 100%   BMI 24.89 kg/m  Gen:   Awake, no distress   HEENT:  Atraumatic  Resp:  Normal effort  Cardiac:  Normal rate  Abd:   Nondistended, nontender  MSK:   Moves extremities without difficulty  Neuro:  Speech clear   Other:   Area noted to the lower abdomen, groin region that is erythematous, warm to touch, indurated.  No active drainage.  Medical Decision Making  Medically screening exam initiated at 9:40 PM  Appropriate orders placed.  Baker was informed that the remainder of the evaluation will be completed by another provider, this initial triage assessment does not replace that evaluation. They are counseled that they will need to remain in the ED until the completion of their workup, including full H&P and results of any tests.  Risks of leaving the emergency department prior to completion of treatment were discussed. Patient was advised to inform ED staff if they are leaving before their treatment is complete. The patient acknowledged these risks and time was allowed for questions.     The  patient appears stable so that the remainder of the MSE may be completed by another provider.    Clinical Impression  fever   Portions of this note were generated with Dragon dictation software. Dictation errors may occur despite best attempts at proofreading.     Dustin Fothergill, PA-C 02/22/21 [id number]    [id number], MD 02/22/21 2227

## 2021-02-22 NOTE — ED Triage Notes (Signed)
Pt arriving with flu-like symptoms that began earlier today. Reports temp of 100.3 at home. Pt reports he first noticed the bug bite yesterday but it has gotten larger since.

## 2021-02-23 ENCOUNTER — Observation Stay (HOSPITAL_COMMUNITY): Payer: 59

## 2021-02-23 ENCOUNTER — Emergency Department (HOSPITAL_COMMUNITY): Payer: 59

## 2021-02-23 ENCOUNTER — Encounter (HOSPITAL_COMMUNITY): Payer: Self-pay

## 2021-02-23 DIAGNOSIS — L97509 Non-pressure chronic ulcer of other part of unspecified foot with unspecified severity: Secondary | ICD-10-CM

## 2021-02-23 DIAGNOSIS — Z8614 Personal history of Methicillin resistant Staphylococcus aureus infection: Secondary | ICD-10-CM | POA: Diagnosis present

## 2021-02-23 DIAGNOSIS — L03319 Cellulitis of trunk, unspecified: Secondary | ICD-10-CM | POA: Diagnosis present

## 2021-02-23 DIAGNOSIS — E11621 Type 2 diabetes mellitus with foot ulcer: Secondary | ICD-10-CM

## 2021-02-23 DIAGNOSIS — R911 Solitary pulmonary nodule: Secondary | ICD-10-CM | POA: Diagnosis not present

## 2021-02-23 DIAGNOSIS — I10 Essential (primary) hypertension: Secondary | ICD-10-CM | POA: Diagnosis not present

## 2021-02-23 LAB — BASIC METABOLIC PANEL
Anion gap: 8 (ref 5–15)
BUN: 10 mg/dL (ref 6–20)
CO2: 22 mmol/L (ref 22–32)
Calcium: 8.8 mg/dL — ABNORMAL LOW (ref 8.9–10.3)
Chloride: 105 mmol/L (ref 98–111)
Creatinine, Ser: 0.7 mg/dL (ref 0.61–1.24)
GFR, Estimated: >60 mL/min (ref >60)
Glucose, Bld: 222 mg/dL — ABNORMAL HIGH (ref 70–99)
Potassium: 3.7 mmol/L (ref 3.5–5.1)
Sodium: 135 mmol/L (ref 135–145)

## 2021-02-23 LAB — HEMOGLOBIN A1C
Hgb A1c MFr Bld: 10.5 % — ABNORMAL HIGH (ref 4.8–5.6)
Mean Plasma Glucose: 254.65 mg/dL

## 2021-02-23 LAB — CBC
HCT: 41.8 % (ref 39.0–52.0)
Hemoglobin: 14.3 g/dL (ref 13.0–17.0)
MCH: 30.5 pg (ref 26.0–34.0)
MCHC: 34.2 g/dL (ref 30.0–36.0)
MCV: 89.1 fL (ref 80.0–100.0)
Platelets: 186 10*3/uL (ref 150–400)
RBC: 4.69 MIL/uL (ref 4.22–5.81)
RDW: 12 % (ref 11.5–15.5)
WBC: 15.2 10*3/uL — ABNORMAL HIGH (ref 4.0–10.5)
nRBC: 0 % (ref 0.0–0.2)

## 2021-02-23 LAB — HIV ANTIBODY (ROUTINE TESTING W REFLEX): HIV Screen 4th Generation wRfx: NONREACTIVE

## 2021-02-23 LAB — RESP PANEL BY RT-PCR (FLU A&B, COVID) ARPGX2
Influenza A by PCR: NEGATIVE
Influenza B by PCR: NEGATIVE
SARS Coronavirus 2 by RT PCR: NEGATIVE

## 2021-02-23 LAB — CBG MONITORING, ED
Glucose-Capillary: 247 mg/dL — ABNORMAL HIGH (ref 70–99)
Glucose-Capillary: 221 mg/dL — ABNORMAL HIGH (ref 70–99)

## 2021-02-23 LAB — GLUCOSE, CAPILLARY
Glucose-Capillary: 196 mg/dL — ABNORMAL HIGH (ref 70–99)
Glucose-Capillary: 225 mg/dL — ABNORMAL HIGH (ref 70–99)

## 2021-02-23 MED ORDER — SODIUM CHLORIDE 0.9 % IV BOLUS
1500.0000 mL | Freq: Once | INTRAVENOUS | Status: AC
  Administered 2021-02-23: 1500 mL via INTRAVENOUS

## 2021-02-23 MED ORDER — IOHEXOL 300 MG/ML  SOLN
100.0000 mL | Freq: Once | INTRAMUSCULAR | Status: AC | PRN
  Administered 2021-02-23: 100 mL via INTRAVENOUS

## 2021-02-23 MED ORDER — VANCOMYCIN HCL IN DEXTROSE 1-5 GM/200ML-% IV SOLN: 1000.0000 mg | Freq: Once | INTRAVENOUS | Status: DC

## 2021-02-23 MED ORDER — INSULIN ASPART 100 UNIT/ML IJ SOLN
0.0000 [IU] | Freq: Every day | INTRAMUSCULAR | Status: DC
  Administered 2021-02-23: 2 [IU] via SUBCUTANEOUS
  Filled 2021-02-23: qty 0.05

## 2021-02-23 MED ORDER — ACETAMINOPHEN 650 MG RE SUPP: 650.0000 mg | Freq: Four times a day (QID) | RECTAL | Status: DC | PRN

## 2021-02-23 MED ORDER — VANCOMYCIN HCL 1250 MG/250ML IV SOLN
1250.0000 mg | Freq: Two times a day (BID) | INTRAVENOUS | Status: DC
  Filled 2021-02-23: qty 250

## 2021-02-23 MED ORDER — INSULIN ASPART 100 UNIT/ML IJ SOLN
0.0000 [IU] | Freq: Three times a day (TID) | INTRAMUSCULAR | Status: DC
  Administered 2021-02-23 (×2): 5 [IU] via SUBCUTANEOUS
  Administered 2021-02-23: 3 [IU] via SUBCUTANEOUS
  Administered 2021-02-24 (×3): 5 [IU] via SUBCUTANEOUS
  Administered 2021-02-25: 3 [IU] via SUBCUTANEOUS
  Administered 2021-02-25: 5 [IU] via SUBCUTANEOUS
  Filled 2021-02-23: qty 0.15

## 2021-02-23 MED ORDER — CLINDAMYCIN PHOSPHATE 600 MG/50ML IV SOLN
600.0000 mg | Freq: Once | INTRAVENOUS | Status: AC
  Administered 2021-02-23: 600 mg via INTRAVENOUS
  Filled 2021-02-23: qty 50

## 2021-02-23 MED ORDER — ENOXAPARIN SODIUM 60 MG/0.6ML IJ SOSY
60.0000 mg | PREFILLED_SYRINGE | INTRAMUSCULAR | Status: DC
  Administered 2021-02-24 – 2021-02-25 (×2): 60 mg via SUBCUTANEOUS
  Filled 2021-02-23 (×2): qty 0.6

## 2021-02-23 MED ORDER — ENOXAPARIN SODIUM 40 MG/0.4ML IJ SOSY
40.0000 mg | PREFILLED_SYRINGE | INTRAMUSCULAR | Status: DC
  Administered 2021-02-23: 40 mg via SUBCUTANEOUS
  Filled 2021-02-23: qty 0.4

## 2021-02-23 MED ORDER — ONDANSETRON HCL 4 MG PO TABS: 4.0000 mg | ORAL_TABLET | Freq: Four times a day (QID) | ORAL | Status: DC | PRN

## 2021-02-23 MED ORDER — SODIUM CHLORIDE 0.9 % IV SOLN
2.0000 g | INTRAVENOUS | Status: DC
  Administered 2021-02-23 – 2021-02-24 (×2): 2 g via INTRAVENOUS
  Filled 2021-02-23 (×2): qty 2

## 2021-02-23 MED ORDER — INSULIN GLARGINE 100 UNIT/ML ~~LOC~~ SOLN
20.0000 [IU] | Freq: Every day | SUBCUTANEOUS | Status: DC
  Administered 2021-02-23: 20 [IU] via SUBCUTANEOUS
  Filled 2021-02-23: qty 0.2

## 2021-02-23 MED ORDER — VANCOMYCIN HCL 1250 MG/250ML IV SOLN
1250.0000 mg | Freq: Two times a day (BID) | INTRAVENOUS | Status: DC
  Administered 2021-02-23 – 2021-02-24 (×3): 1250 mg via INTRAVENOUS
  Filled 2021-02-23 (×4): qty 250

## 2021-02-23 MED ORDER — VANCOMYCIN HCL 2000 MG/400ML IV SOLN
2000.0000 mg | INTRAVENOUS | Status: AC
  Administered 2021-02-23: 2000 mg via INTRAVENOUS
  Filled 2021-02-23: qty 400

## 2021-02-23 MED ORDER — ONDANSETRON HCL 4 MG/2ML IJ SOLN
4.0000 mg | Freq: Once | INTRAMUSCULAR | Status: AC
  Administered 2021-02-23: 4 mg via INTRAVENOUS
  Filled 2021-02-23: qty 2

## 2021-02-23 MED ORDER — ONDANSETRON HCL 4 MG/2ML IJ SOLN: 4.0000 mg | Freq: Four times a day (QID) | INTRAMUSCULAR | Status: DC | PRN

## 2021-02-23 MED ORDER — SODIUM CHLORIDE 0.9 % IV SOLN
1.0000 g | Freq: Once | INTRAVENOUS | Status: AC
  Administered 2021-02-23: 1 g via INTRAVENOUS
  Filled 2021-02-23: qty 1

## 2021-02-23 MED ORDER — SODIUM CHLORIDE (PF) 0.9 % IJ SOLN
INTRAMUSCULAR | Status: AC
  Filled 2021-02-23: qty 50

## 2021-02-23 MED ORDER — SODIUM CHLORIDE 0.9 % IV SOLN
1.0000 g | Freq: Three times a day (TID) | INTRAVENOUS | Status: DC
  Administered 2021-02-23: 1 g via INTRAVENOUS
  Filled 2021-02-23: qty 1

## 2021-02-23 MED ORDER — ACETAMINOPHEN 325 MG PO TABS: 650.0000 mg | ORAL_TABLET | Freq: Four times a day (QID) | ORAL | Status: DC | PRN

## 2021-02-23 NOTE — ED Notes (Signed)
Report sent to floor nurse.  

## 2021-02-23 NOTE — ED Notes (Signed)
VSS at this time.

## 2021-02-23 NOTE — H&P (Signed)
History and Physical    Dustin Baker DDU:[id number] DOB: 1970/10/02 DOA: 02/22/2021  PCP: [facility]  Patient coming from: Home  I have personally briefly reviewed patient's old medical records in [facility]  Chief Complaint: Abd cellulitis  HPI: Dustin Baker is a 50 y.o. male with medical history significant of DM2, HTN.  Pt has h/o diabetic foot ulcer, grew MRSA in March, foot ulcer(s) have since healed up completely.  Pt presents to ED with suprapubic erythema, edema, onset 3 days ago.  Worsening.  Now with associated fevers, chills, nausea.  No known insect bite.  No testicular pain, swelling, erythema,.  No vomiting, dysuria, syncope, cough.   ED Course: Has cellulitis of lower abdomen.  WBC 16k.  Lactate 1.8.  Tm 100.1  EDP started merrem, clinda, vanc.   Review of Systems: As per HPI, otherwise all review of systems negative.  Past Medical History:  Diagnosis Date   Allergy    Diabetes mellitus without complication (HCC)    High cholesterol    Hives of unknown origin    Hypertension     Past Surgical History:  Procedure Laterality Date   none     VASECTOMY       reports that he has never smoked. He has never used smokeless tobacco. He reports current alcohol use. He reports that he does not use drugs.  Allergies  Allergen Reactions   Penicillins Other (See Comments)    Childhood reaction    Family History  Problem Relation Age of Onset   Diabetes Father    Diabetes Paternal Grandfather      Prior to Admission medications   Medication Sig Start Date End Date Taking? Authorizing Provider  atorvastatin (LIPITOR) 20 MG tablet Take 20 mg by mouth daily.    [provider]  empagliflozin (JARDIANCE) 10 MG TABS tablet Take 10 mg by mouth daily. Reported on 02/13/2016    [provider]  ibuprofen (ADVIL,MOTRIN) 200 MG tablet Take 400 mg by mouth every 6 (six) hours as needed for headache.    [provider]  Insulin Glargine (BASAGLAR KWIKPEN) 100 UNIT/ML SOPN Inject 84 Units into the skin daily.  01/19/16   [provider]  lisinopril (PRINIVIL,ZESTRIL) 5 MG tablet Take 5 mg by mouth daily. Reported on 02/13/2016    [provider]  metFORMIN (GLUCOPHAGE) 1000 MG tablet Take 1 tablet (1,000 mg total) by mouth 2 (two) times daily. 10/18/16   Horton, Mayer Masker, MD  mupirocin ointment (BACTROBAN) 2 % Apply 1 application topically 2 (two) times daily. 05/25/19   Vivi Barrack, DPM  mupirocin ointment (BACTROBAN) 2 % Apply 1 application topically 2 (two) times daily. 05/25/19   Vivi Barrack, DPM  omeprazole (PRILOSEC) 20 MG capsule Take 20 mg by mouth daily.  11/29/15   [provider]  ondansetron (ZOFRAN ODT) 4 MG disintegrating tablet Take 1 tablet (4 mg total) by mouth every 8 (eight) hours as needed for nausea or vomiting. 10/18/16   Shon Baton, MD    Physical Exam: Vitals:   02/23/21 0200 02/23/21 0230 02/23/21 0300 02/23/21 0435  BP: 123/89 124/86 122/86   Pulse: (!) 108 (!) 103 (!) 102   Resp: (!) 22 20 18    Temp:      TempSrc:      SpO2: 93% 93% 98%   Weight:    120 kg  Height:    5\' 11"  (1.803 m)  Constitutional: NAD, calm, comfortable Eyes: PERRL, lids and conjunctivae normal ENMT: Mucous membranes are moist. Posterior pharynx clear of any exudate or lesions.Normal dentition.  Neck: normal, supple, no masses, no thyromegaly Respiratory: clear to auscultation bilaterally, no wheezing, no crackles. Normal respiratory effort. No accessory muscle use.  Cardiovascular: Regular rate and rhythm, no murmurs / rubs / gallops. No extremity edema. 2+ pedal pulses. No carotid bruits.  Abdomen: no tenderness, no masses palpated. No hepatosplenomegaly. Bowel sounds positive.  Musculoskeletal: no clubbing / cyanosis. No joint deformity upper and lower extremities. Good ROM, no contractures. Normal muscle tone.  Skin: Erythema of lower  abdomen starting in suprapubic region.  No pain beyond site of erythema, no pain out of proportion to exam findings, no crepitus. Neurologic: CN 2-12 grossly intact. Sensation intact, DTR normal. Strength 5/5 in all 4.  Psychiatric: Normal judgment and insight. Alert and oriented x 3. Normal mood.    Labs on Admission: I have personally reviewed following labs and imaging studies  CBC: Recent Labs  Lab 02/22/21 2307  WBC 16.0*  NEUTROABS 13.1*  HGB 15.6  HCT 44.8  MCV 87.7  PLT 213   Basic Metabolic Panel: Recent Labs  Lab 02/22/21 2307  NA 132*  K 4.1  CL 97*  CO2 23  GLUCOSE 274*  BUN 15  CREATININE 0.92  CALCIUM 9.6   GFR: Estimated Creatinine Clearance: 129.4 mL/min (by C-G formula based on SCr of 0.92 mg/dL). Liver Function Tests: Recent Labs  Lab 02/22/21 2307  AST 18  ALT 25  ALKPHOS 43  BILITOT 0.9  PROT 8.0  ALBUMIN 4.3   Recent Labs  Lab 02/22/21 2307  LIPASE 31   No results for input(s): AMMONIA in the last 168 hours. Coagulation Profile: No results for input(s): INR, PROTIME in the last 168 hours. Cardiac Enzymes: No results for input(s): CKTOTAL, CKMB, CKMBINDEX, TROPONINI in the last 168 hours. BNP (last 3 results) No results for input(s): PROBNP in the last 8760 hours. HbA1C: No results for input(s): HGBA1C in the last 72 hours. CBG: No results for input(s): GLUCAP in the last 168 hours. Lipid Profile: No results for input(s): CHOL, HDL, LDLCALC, TRIG, CHOLHDL, LDLDIRECT in the last 72 hours. Thyroid Function Tests: No results for input(s): TSH, T4TOTAL, FREET4, T3FREE, THYROIDAB in the last 72 hours. Anemia Panel: No results for input(s): VITAMINB12, FOLATE, FERRITIN, TIBC, IRON, RETICCTPCT in the last 72 hours. Urine analysis:    Component Value Date/Time   COLORURINE YELLOW 02/22/2021 2330   APPEARANCEUR CLEAR 02/22/2021 2330   LABSPEC 1.036 (H) 02/22/2021 2330   PHURINE 6.0 02/22/2021 2330   GLUCOSEU >=500 (A) 02/22/2021 2330    HGBUR SMALL (A) 02/22/2021 2330   BILIRUBINUR NEGATIVE 02/22/2021 2330   BILIRUBINUR negative 02/13/2016 1053   KETONESUR 20 (A) 02/22/2021 2330   PROTEINUR NEGATIVE 02/22/2021 2330   UROBILINOGEN 0.2 02/13/2016 1053   NITRITE NEGATIVE 02/22/2021 2330   LEUKOCYTESUR NEGATIVE 02/22/2021 2330    Radiological Exams on Admission: CT ABDOMEN PELVIS W CONTRAST  Result Date: 02/23/2021 CLINICAL DATA:  Lower abdominal pain. Status post bug bite in lower abdomen/right groin pain. Leukocytosis. History of a cecectomy. EXAM: CT ABDOMEN AND PELVIS WITH CONTRAST TECHNIQUE: Multidetector CT imaging of the abdomen and pelvis was performed using the standard protocol following bolus administration of intravenous contrast. CONTRAST:  100mL OMNIPAQUE IOHEXOL 300 MG/ML  SOLN COMPARISON:  CT abdomen pelvis 02/13/2016 FINDINGS: Lower chest: Bilateral lower lobe subsegmental atelectasis. Linear atelectasis within the lingula. Interval development of  a 1.1 cm right middle lobe pulmonary nodule. Tiny hiatal hernia. Hepatobiliary: No focal liver abnormality. No gallstones, gallbladder wall thickening, or pericholecystic fluid. No biliary dilatation. Pancreas: No focal lesion. Normal pancreatic contour. No surrounding inflammatory changes. No main pancreatic ductal dilatation. Spleen: Normal in size without focal abnormality. Adrenals/Urinary Tract: No adrenal nodule bilaterally. Bilateral kidneys enhance symmetrically. No hydronephrosis. No hydroureter. The urinary bladder is unremarkable. On delayed imaging, there is no urothelial wall thickening and there are no filling defects in the opacified portions of the bilateral collecting systems or ureters. Stomach/Bowel: Stomach is within normal limits. No evidence of bowel wall thickening or dilatation. Appendix appears normal. Vascular/Lymphatic: No abdominal aorta or iliac aneurysm. Mild atherosclerotic plaque. No abdominal, pelvic, or inguinal lymphadenopathy. Reproductive:  Prostate is unremarkable. Other: No intraperitoneal free fluid. No intraperitoneal free gas. No organized fluid collection. Musculoskeletal: Subcutaneus soft tissue edema along the mons pubis and inferiorly. Visualized portions of the perineum, perirectal region, and gluteal soft tissues demonstrate no inflammatory changes. No organized fluid collection identified. No free gas noted. No suspicious lytic or blastic osseous lesions. No acute displaced fracture. Multilevel degenerative changes of the spine. IMPRESSION: 1. Subcutaneus soft tissue edema along the mons pubis and inferiorly. Please note that the penis is only partially visualized and the scrota are not imaged due to collimation off view. No associated subcutaneus soft tissue emphysema or organized fluid collection identified. Please note a necrotizing fasciitis cannot be excluded as this is a clinical diagnosis. 2. Interval development of a 1.1 cm right middle lobe pulmonary nodule. Consider one of the following in 3 months for both low-risk and high-risk individuals: (a) repeat chest CT, (b) follow-up PET-CT, or (c) tissue sampling. This recommendation follows the consensus statement: Guidelines for Management of Incidental Pulmonary Nodules Detected on CT Images: From the Fleischner Society 2017; Radiology 2017; 284:228-243. Electronically Signed   By: Tish Frederickson M.D.   On: 02/23/2021 03:06    EKG: Independently reviewed.  Assessment/Plan Principal Problem:   Cellulitis of trunk Active Problems:   Type 2 diabetes mellitus (HCC)   Essential hypertension   Pulmonary nodule   History of MRSA infection    Cellulitis of trunk - No testicular involvement.  No pain out of proportion to exam findings, no pain beyond site of erythema. Clinically: necrotizing fasciitis seems unlikely Will de-escalate ABx to just merrem+vanc for the moment. Do want to keep the vanc though as he has h/o MRSA infection of foot just a couple months ago. BCx  ordered Cellulitis pathway DM2 - Home med rec pending, not on insulin at home anymore per patient Will just put pt on mod scale SSI AC/HS for the moment until we can get list of home meds HTN - Home med rec pending Pulmonary nodule - Will put in for pulm routine consult for AM to see if theyd rather follow up imaging vs biopsy. They will see in AM Requested CT chest w/o contrast (ordered this).  DVT prophylaxis: Lovenox Code Status: Full Family Communication: No family in room Disposition Plan: Home after treatment for cellulitis Consults called: Message sent to [facility] for AM consult Admission status: Place in obs    Lauralie Blacksher M. DO [facility]  How to contact the [facility] Attending or Consulting provider 7A - 7P or covering provider during after hours 7P -7A, for this patient?  Check the care team in [facility] and look for a) attending/consulting [facility] provider listed and b) the [facility] team listed Log into [email or web address]  Amion  Physician Scheduling and messaging for groups and whole hospitals  On call and physician scheduling software for group practices, residents, hospitalists and other medical providers for call, clinic, rotation and shift schedules. On[employer] is a hospital-wide system for scheduling doctors and paging doctors on call. EasyPlot is for scientific plotting and data analysis.  [email or web address]  and use [place]'s universal password to access. If you do not have the password, please contact the hospital operator.  Locate the [facility] provider you are looking for under [facility] and page to a number that you can be directly reached. If you still have difficulty reaching the provider, please page the [facility] (Director on Call) for the Hospitalists listed on amion for assistance.  02/23/2021, 4:37 AM

## 2021-02-23 NOTE — Progress Notes (Addendum)
Inpatient Diabetes Program Recommendations  AACE/ADA: New Consensus Statement on Inpatient Glycemic Control (2015)  Target Ranges:  Prepandial:   less than 140 mg/dL      Peak postprandial:   less than 180 mg/dL (1-2 hours)      Critically ill patients:  140 - 180 mg/dL   Lab Results  Component Value Date   GLUCAP 247 (H) 02/23/2021   HGBA1C 10.5 (H) 02/23/2021    Review of Glycemic Control Results for Dustin Baker, Dustin Baker (MRN [id number]) as of 02/23/2021 10:28  Ref. Range 02/23/2021 09:41  Glucose-Capillary Latest Ref Range: 70 - 99 mg/dL 099 (H)   Diabetes history: DM 2 Outpatient Diabetes medications:  None noted- will discuss with patient  Current orders for Inpatient glycemic control:  Novolog moderate tid with meals and HS  Inpatient Diabetes Program Recommendations:    Please consider adding Lantus 24 units daily.  A1C elevated.  Note patient was not on insulin prior to admit, however it appears he needs too be taking insulin again.   Thanks,  Beryl Meager, RN, BC-ADM Inpatient Diabetes Coordinator Pager [phone]  (8a-5p)  Addendum 1400: Spoke with patient regarding use of insulin at home.  He states that he has not taken insulin in over 2 years.  He states he did not have insurance, but now does.  He has re-established care at the [facility] clinic and states he does have Rx. Coverage.  We discussed his A1C of 10.5% and discussed need for improved glycemic control.  Told patient he will likely need insulin at d/c based on A1C and for healing.  Patient agreed and states he is okay with being put back on insulin.  Will follow.

## 2021-02-23 NOTE — ED Notes (Signed)
Pharmacy notified and aware of pt's reaction.

## 2021-02-23 NOTE — ED Notes (Signed)
Pt states his symptoms of "itching and flushing" have resolved.

## 2021-02-23 NOTE — ED Notes (Signed)
Attempted to draw blood for labs, unsuccessful.  Tabitha,RN at bedside now to attempt.

## 2021-02-23 NOTE — ED Notes (Signed)
Floor nurse notified that pt has not received noon dose of Novolog. Floor nurse notified pt has not eaten lunch and that POC BG is 221.

## 2021-02-23 NOTE — ED Notes (Addendum)
ED RN Katie notified this nurse that pt began c/o head itching, and facial flsuhing, however, pt's IV Vancomycin has been infusing for almost two hours. Hospitalist, Dr. Sharl Ma was notified. The vancomycin was stopped and no additional orders at this time. Hospitalist to round on pt in ED. Will continue to monitor.

## 2021-02-23 NOTE — Consult Note (Addendum)
NAME:  Dustin Baker, MRN:  [id number], DOB:  08-Mar-1971, LOS: 0 ADMISSION DATE:  02/22/2021, CONSULTATION DATE: 02/23/2021 REFERRING MD: Dr. Julian Reil, CHIEF COMPLAINT: Right lobe pulmonary nodule  History of Present Illness:  50 year old never smoker with history of hypertension and type 2 diabetes with associated neuropathy, prior MRSA diabetic foot ulcer (treated).  He is admitted now with right suprapubic erythema consistent with cellulitis.  His evaluation has included a CT scan of the abdomen that identified a 1.1 cm right middle lobe pulmonary nodule, 0.8 cm adjacent pulmonary nodule right middle lobe.  Both new compared with CT abdomen 02/13/2016.  Subsequent CT chest performed 6/22 as below, confirmed the presence of these nodules.  We are consulted for further evaluation.  Pertinent  Medical History   Past Medical History:  Diagnosis Date   Allergy    Diabetes mellitus without complication (HCC)    High cholesterol    Hives of unknown origin    Hypertension     Significant Hospital Events: Including procedures, antibiotic start and stop dates in addition to other pertinent events   Admitted 6/22 for IV antibiotics for suprapubic cellulitis CT abdomen, CT chest revealed right middle lobe nodules  Interim History / Subjective:  Denies cough, dyspnea, respiratory symptoms He has had some fever, right suprapubic discomfort, stable  Objective   Blood pressure (!) 125/91, pulse 94, temperature 98.3 F (36.8 C), temperature source Oral, resp. rate 15, height 5\' 11"  (1.803 m), weight 120 kg, SpO2 97 %.       No intake or output data in the 24 hours ending 02/23/21 1104 Filed Weights   02/23/21 0435  Weight: 120 kg    Examination: General: Well-appearing man on room air, comfortable HENT: Oropharynx clear, pupils equal Lungs: Clear bilaterally, no wheezes or crackles Cardiovascular: Regular, no murmur Abdomen: Nondistended.  Erythema in the right suprapubic area, no  open skin, lesion or drainage Extremities: No edema Neuro: Awake, alert, interacting appropriately, follows commands, good strength  Labs/imaging that I have personally reviewed  (right click and "Reselect all SmartList Selections" daily)   CT chest 6/22 >> 1.1 cm right middle lobe pulmonary nodule, 0.8 cm adjacent pulmonary nodule right middle lobe.  Both new compared with CT abdomen 02/13/2016.  Assessment & Plan:  Right middle lobe pulmonary nodules spuriously found by CT chest 6/22 in a low risk patient.  Unclear etiology.  Discussed the possible causes with the patient including scar, benign inflammatory process, benign tumors, infection, autoimmune process.  Also discussed the low likelihood of malignancy specially since he is a low risk patient.  Recommend that we repeat his CT scan of the chest without contrast in 6 months and that he follow with me in office to review.  If there is any relevant interval change then we will talk about appropriate diagnostics including possible bronchoscopy.  I will ask my office to get the CT scan and the office visit set up for him.      Labs   CBC: Recent Labs  Lab 02/22/21 2307 02/23/21 0511  WBC 16.0* 15.2*  NEUTROABS 13.1*  --   HGB 15.6 14.3  HCT 44.8 41.8  MCV 87.7 89.1  PLT 213 186    Basic Metabolic Panel: Recent Labs  Lab 02/22/21 2307 02/23/21 0511  NA 132* 135  K 4.1 3.7  CL 97* 105  CO2 23 22  GLUCOSE 274* 222*  BUN 15 10  CREATININE 0.92 0.70  CALCIUM 9.6 8.8*   GFR:  Estimated Creatinine Clearance: 148.9 mL/min (by C-G formula based on SCr of 0.7 mg/dL). Recent Labs  Lab 02/22/21 2307 02/23/21 0511  WBC 16.0* 15.2*  LATICACIDVEN 1.8  --     Liver Function Tests: Recent Labs  Lab 02/22/21 2307  AST 18  ALT 25  ALKPHOS 43  BILITOT 0.9  PROT 8.0  ALBUMIN 4.3   Recent Labs  Lab 02/22/21 2307  LIPASE 31    HbA1C: Hgb A1c MFr Bld  Date/Time Value Ref Range Status  02/23/2021 05:11 AM 10.5 (H)  4.8 - 5.6 % Final    Comment:    (NOTE) Pre diabetes:          5.7%-6.4%  Diabetes:              >6.4%  Glycemic control for   <7.0% adults with diabetes     CBG: Recent Labs  Lab 02/23/21 0941  GLUCAP 247*    Review of Systems:   As above, otherwise negative  Past Medical History:  He,  has a past medical history of Allergy, Diabetes mellitus without complication (HCC), High cholesterol, Hives of unknown origin, and Hypertension.   Surgical History:   Past Surgical History:  Procedure Laterality Date   none     VASECTOMY       Social History:   reports that he has never smoked. He has never used smokeless tobacco. He reports current alcohol use. He reports that he does not use drugs.   Never smoker.  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  Some water damage in his townhome but never any mold.  He was not in the [employer].  He has never owned birds.  No hot tub or swimming pool.  Family History:  His family history includes Diabetes in his father and paternal grandfather.   Lung cancer in his maternal grandfather.  No first-degree relatives with lung cancer.  Allergies Allergies  Allergen Reactions   Penicillins Other (See Comments)    Childhood reaction   Vancomycin Itching    Redness and itching after infusion      Home Medications  Prior to Admission medications   Medication Sig Start Date End Date Taking? Authorizing Provider  gabapentin (NEURONTIN) 100 MG capsule Take 100 mg by mouth daily. 02/01/21  Yes [provider]  hydrochlorothiazide (MICROZIDE) 12.5 MG capsule Take 12.5 mg by mouth daily. 02/01/21  Yes [provider]  ibuprofen (ADVIL,MOTRIN) 200 MG tablet Take 400 mg by mouth every 6 (six) hours as needed for headache.   Yes [provider]  lisinopril (ZESTRIL) 2.5 MG tablet Take 2.5 mg by mouth daily. Reported on 02/13/2016   Yes [provider]  metFORMIN (GLUCOPHAGE-XR) 500 MG 24 hr tablet Take 500 mg by mouth daily. 10/25/20  Yes [provider]  pravastatin (PRAVACHOL) 40 MG tablet Take 40 mg by mouth daily. 02/19/21  Yes [provider]  metFORMIN (GLUCOPHAGE) 1000 MG tablet Take 1 tablet (1,000 mg total) by mouth 2 (two) times daily. Patient not taking: No sig reported 10/18/16   Horton, Mayer Masker, MD  mupirocin ointment (BACTROBAN) 2 % Apply 1 application topically 2 (two) times daily. Patient not taking: No sig reported 05/25/19   Vivi Barrack, DPM  mupirocin ointment (BACTROBAN) 2 % Apply 1 application topically 2 (two) times daily. Patient not taking: No sig reported 05/25/19   Vivi Barrack, DPM  ondansetron (ZOFRAN ODT) 4 MG disintegrating tablet Take 1 tablet (4 mg total) by mouth every 8 (eight) hours as needed for nausea or vomiting. Patient not taking: No sig reported 10/18/16   Horton, Mayer Masker, MD     Levy Pupa, MD, PhD 02/23/2021, 11:15 AM [facility] [phone] or if no answer before 7:00PM call [phone] For any issues after 7:00PM please call eLink [phone]

## 2021-02-23 NOTE — Progress Notes (Signed)
Pharmacy Antibiotic Note  Dustin Baker is a 50 y.o. male admitted on 02/22/2021 with abdominal cellulitis.  PMH significant for +MRSA diabetic foot ulcer (March 2022), DM, HLD, HTN. Pharmacy has been consulted for Vancomycin and Meropenem dosing.  Plan: Vancomycin 2gm IV x 1 followed by 1250 mg IV Q 12 hrs. Goal AUC 400-550.  Expected AUC: 456.9  SCr used: 0.92 Meropenem 1g IV q8h Follow renal function F/U culture results and sensitivities Check Vancomycin levels as needed   Height: 5\' 11"  (180.3 cm) Weight: 120 kg (264 lb 8.8 oz) IBW/kg (Calculated) : 75.3  Temp (24hrs), Avg:100.1 F (37.8 C), Min:100 F (37.8 C), Max:100.1 F (37.8 C)  Recent Labs  Lab 02/22/21 2307  WBC 16.0*  CREATININE 0.92  LATICACIDVEN 1.8    Estimated Creatinine Clearance: 129.4 mL/min (by C-G formula based on SCr of 0.92 mg/dL).    Allergies  Allergen Reactions   Penicillins Other (See Comments)    Childhood reaction    Antimicrobials this admission: 6/23 Clindamycin x 1 6/23 Meropenem >>   6/23 Vancomycin >>      Dose adjustments this admission:    Microbiology results: 6/23 BCx:    Thank you for allowing pharmacy to be a part of this patient's care.  7/23, PharmD 02/23/2021 4:45 AM

## 2021-02-23 NOTE — Progress Notes (Addendum)
Subjective: Patient admitted this morning, see detailed H&P by Dr. Julian Reil 50 year old male with medical history of diabetes mellitus, hypertension presented with suprapubic erythema, fever, chills, nausea.  Denies testicular pain.  Patient admitted with suprapubic cellulitis.  Started on vancomycin and meropenem.   Vitals:   02/23/21 0938 02/23/21 1240  BP: (!) 125/91 128/90  Pulse: 94 87  Resp: 15 17  Temp: 98.3 F (36.8 C) (!) 97.4 F (36.3 C)  SpO2: 97% 98%      A/P Suprapubic cellulitis-patient has small lesion on upper right corner of suprapubic area.  Likely source of cellulitis.  He has poorly controlled diabetes mellitus type 2.  He has been started on vancomycin and meropenem.  We will change meropenem to IV ceftriaxone.  Continue vancomycin.  Diabetes mellitus type 2-patient takes metformin at home.  Hemoglobin A1c elevated at 10.5.  Continue sliding scale insulin overnight.  Start Lantus 20 units subcu daily.  Hypertension-blood pressure stable, continue to hold lisinopril/HCTZ  Pulmonary nodule-pulmonology consulted, recommend repeat CT of chest in 6 months.      Meredeth Ide [facility] [phone]

## 2021-02-23 NOTE — ED Notes (Signed)
Pt ambulatory to and from bathroom with no difficulty.

## 2021-02-23 NOTE — ED Notes (Signed)
Pt transported to floor via stretcher and with ED tech accompanying.

## 2021-02-23 NOTE — ED Provider Notes (Addendum)
[facility]-EMERGENCY DEPT Provider Note   CSN: [id number] Arrival date & time: 02/22/21  2058     History Chief Complaint  Patient presents with   flu like symptoms   Rash    Peggye FothergillJohn T Ryland III is a 50 y.o. male with a hx of hypertension, hypercholesterolemia, & T2DM who presents to the ED with complaints of worsening right groin infection x 3 days. Patient states he noted an area of redness/irritation to the right suprapubic region a few days ago that he thought might be an ingrown hair, it has gotten worse with spreading redness, painful with palpation, no other alleviating/aggravating factors. Having associated subjective fever, chills, nausea, and lightheadedness at times. Denies known insect bite or tic bite, has not been spending time in wooded areas and does not have any pets that would have brought tics into the home. Denies testicular pain/swelling, vomiting, dysuria, diarrhea, syncope, dyspnea, or cough.   HPI     Past Medical History:  Diagnosis Date   Allergy    Diabetes mellitus without complication (HCC)    High cholesterol    Hives of unknown origin    Hypertension     Patient Active Problem List   Diagnosis Date Noted   Anginal chest pain at rest [facility](HCC) 12/01/2013   Type 2 diabetes mellitus (HCC) 12/01/2013   Essential hypertension 12/01/2013   Hyperlipidemia 12/01/2013    Past Surgical History:  Procedure Laterality Date   none     VASECTOMY         Family History  Problem Relation Age of Onset   Diabetes Father    Diabetes Paternal Grandfather     Social History   Tobacco Use   Smoking status: Never   Smokeless tobacco: Never  Substance Use Topics   Alcohol use: Yes    Comment: Once a week.    Drug use: No    Home Medications Prior to Admission medications   Medication Sig Start Date End Date Taking? Authorizing Provider  atorvastatin (LIPITOR) 20 MG tablet Take 20 mg by mouth daily.    [provider]   cephALEXin (KEFLEX) 500 MG capsule Take 1 capsule (500 mg total) by mouth 4 (four) times daily. Pt needs to be seen in office prior to future refills. 05/21/19   Vivi BarrackWagoner, Matthew R, DPM  empagliflozin (JARDIANCE) 10 MG TABS tablet Take 10 mg by mouth daily. Reported on 02/13/2016    [provider]  ibuprofen (ADVIL,MOTRIN) 200 MG tablet Take 400 mg by mouth every 6 (six) hours as needed for headache.    [provider]  Insulin Glargine (BASAGLAR KWIKPEN) 100 UNIT/ML SOPN Inject 84 Units into the skin daily.  01/19/16   [provider]  lisinopril (PRINIVIL,ZESTRIL) 5 MG tablet Take 5 mg by mouth daily. Reported on 02/13/2016    [provider]  metFORMIN (GLUCOPHAGE) 1000 MG tablet Take 1 tablet (1,000 mg total) by mouth 2 (two) times daily. 10/18/16   Horton, Mayer Maskerourtney F, MD  mupirocin ointment (BACTROBAN) 2 % Apply 1 application topically 2 (two) times daily. 05/25/19   Vivi BarrackWagoner, Matthew R, DPM  mupirocin ointment (BACTROBAN) 2 % Apply 1 application topically 2 (two) times daily. 05/25/19   Vivi BarrackWagoner, Matthew R, DPM  omeprazole (PRILOSEC) 20 MG capsule Take 20 mg by mouth daily.  11/29/15   [provider]  ondansetron (ZOFRAN ODT) 4 MG disintegrating tablet Take 1 tablet (4 mg total) by mouth every 8 (eight) hours as needed for nausea  or vomiting. 10/18/16   Horton, Mayer Masker, MD  sulfamethoxazole-trimethoprim (BACTRIM DS,SEPTRA DS) 800-160 MG tablet Take 1 tablet by mouth 2 (two) times daily. 05/27/18   [provider]    Allergies    Penicillins  Review of Systems   Review of Systems  Constitutional:  Positive for chills and fever.  Respiratory:  Negative for cough and shortness of breath.   Cardiovascular:  Negative for chest pain.  Gastrointestinal:  Positive for nausea. Negative for diarrhea and vomiting.  Genitourinary:  Negative for dysuria, scrotal swelling and testicular pain.  Skin:  Positive for color change and wound.  Neurological:   Positive for light-headedness. Negative for syncope.  All other systems reviewed and are negative.  Physical Exam Updated Vital Signs BP (!) 146/92   Pulse (!) 110   Temp 100 F (37.8 C) (Oral)   Resp 17   SpO2 97%   Physical Exam Vitals and nursing note reviewed.  Constitutional:      General: He is not in acute distress.    Appearance: He is well-developed. He is not toxic-appearing.  HENT:     Head: Normocephalic and atraumatic.  Eyes:     General:        Right eye: No discharge.        Left eye: No discharge.     Conjunctiva/sclera: Conjunctivae normal.  Cardiovascular:     Rate and Rhythm: Regular rhythm. Tachycardia present.  Pulmonary:     Effort: Pulmonary effort is normal. No respiratory distress.     Breath sounds: Normal breath sounds. No wheezing, rhonchi or rales.  Abdominal:     General: There is no distension.     Palpations: Abdomen is soft.     Tenderness: There is abdominal tenderness (over area of erythema to right suprapbuic region). There is no guarding or rebound.    Genitourinary:    Comments: RN caroline present as chaperone  Musculoskeletal:     Cervical back: Neck supple.  Skin:    General: Skin is warm and dry.     Findings: No rash.  Neurological:     Mental Status: He is alert.     Comments: Clear speech.   Psychiatric:        Behavior: Behavior normal.    ED Results / Procedures / Treatments   Labs (all labs ordered are listed, but only abnormal results are displayed) Labs Reviewed  COMPREHENSIVE METABOLIC PANEL - Abnormal; Notable for the following components:      Result Value   Sodium 132 (*)    Chloride 97 (*)    Glucose, Bld 274 (*)    All other components within normal limits  CBC WITH DIFFERENTIAL/PLATELET - Abnormal; Notable for the following components:   WBC 16.0 (*)    Neutro Abs 13.1 (*)    All other components within normal limits  URINALYSIS, ROUTINE W REFLEX MICROSCOPIC - Abnormal; Notable for the following  components:   Specific Gravity, Urine 1.036 (*)    Glucose, UA >=500 (*)    Hgb urine dipstick SMALL (*)    Ketones, ur 20 (*)    All other components within normal limits  LIPASE, BLOOD  LACTIC ACID, PLASMA    EKG None  Radiology CT ABDOMEN PELVIS W CONTRAST  Result Date: 02/23/2021 CLINICAL DATA:  Lower abdominal pain. Status post bug bite in lower abdomen/right groin pain. Leukocytosis. History of a cecectomy. EXAM: CT ABDOMEN AND PELVIS WITH CONTRAST TECHNIQUE: Multidetector CT imaging of the abdomen  and pelvis was performed using the standard protocol following bolus administration of intravenous contrast. CONTRAST:  OMNIPAQUE IOHEXOL 300 MG/ML  SOLN COMPARISON:  CT abdomen pelvis 02/13/2016 FINDINGS: Lower chest: Bilateral lower lobe subsegmental atelectasis. Linear atelectasis within the lingula. Interval development of a 1.1 cm right middle lobe pulmonary nodule. Tiny hiatal hernia. Hepatobiliary: No focal liver abnormality. No gallstones, gallbladder wall thickening, or pericholecystic fluid. No biliary dilatation. Pancreas: No focal lesion. Normal pancreatic contour. No surrounding inflammatory changes. No main pancreatic ductal dilatation. Spleen: Normal in size without focal abnormality. Adrenals/Urinary Tract: No adrenal nodule bilaterally. Bilateral kidneys enhance symmetrically. No hydronephrosis. No hydroureter. The urinary bladder is unremarkable. On delayed imaging, there is no urothelial wall thickening and there are no filling defects in the opacified portions of the bilateral collecting systems or ureters. Stomach/Bowel: Stomach is within normal limits. No evidence of bowel wall thickening or dilatation. Appendix appears normal. Vascular/Lymphatic: No abdominal aorta or iliac aneurysm. Mild atherosclerotic plaque. No abdominal, pelvic, or inguinal lymphadenopathy. Reproductive: Prostate is unremarkable. Other: No intraperitoneal free fluid. No intraperitoneal free gas. No  organized fluid collection. Musculoskeletal: Subcutaneus soft tissue edema along the mons pubis and inferiorly. Visualized portions of the perineum, perirectal region, and gluteal soft tissues demonstrate no inflammatory changes. No organized fluid collection identified. No free gas noted. No suspicious lytic or blastic osseous lesions. No acute displaced fracture. Multilevel degenerative changes of the spine. IMPRESSION: 1. Subcutaneus soft tissue edema along the mons pubis and inferiorly. Please note that the penis is only partially visualized and the scrota are not imaged due to collimation off view. No associated subcutaneus soft tissue emphysema or organized fluid collection identified. Please note a necrotizing fasciitis cannot be excluded as this is a clinical diagnosis. 2. Interval development of a 1.1 cm right middle lobe pulmonary nodule. Consider one of the following in 3 months for both low-risk and high-risk individuals: (a) repeat chest CT, (b) follow-up PET-CT, or (c) tissue sampling. This recommendation follows the consensus statement: Guidelines for Management of Incidental Pulmonary Nodules Detected on CT Images: From the Fleischner Society 2017; Radiology 2017; 284:228-243. Electronically Signed   By: Tish Frederickson M.D.   On: 02/23/2021 03:06    Procedures Procedures   Medications Ordered in ED Medications  sodium chloride (PF) 0.9 % injection (has no administration in time range)  iohexol (OMNIPAQUE) 300 MG/ML solution 100 mL (has no administration in time range)  acetaminophen (TYLENOL) tablet 1,000 mg (1,000 mg Oral Given 02/22/21 2330)    ED Course  I have reviewed the triage vital signs and the nursing notes.  Pertinent labs & imaging results that were available during my care of the patient were reviewed by me and considered in my medical decision making (see chart for details).    MDM Rules/Calculators/A&P                          Patient presents to the ED within  complaints of right groin/abdominal infection with associated constitutional symptoms. Patient is nontoxic, tachycardic with oral temp upper limits of normal. Notable erythema/induration to right suprapubic region which does not extend to the penile shaft, scrotum or perineum. No palpable crepitus.   Additional history obtained:  Additional history obtained from chart review & nursing note review.   Lab Tests:  I reviewed and interpreted labs, which included:  CBC: Leukocytosis at 16 with left shift CMP: Hyperglycemia without acidosis or anion gap elevation Urinalysis: Glucosuria and ketonuria with  elevated specific gravity. Lipase: Within normal limits Lactic acid: Within normal limits  Imaging Studies ordered:  CT A/P ordered in triage, I independently reviewed, formal radiology impression shows:  1. Subcutaneus soft tissue edema along the mons pubis and inferiorly. Please note that the penis is only partially visualized and the scrota are not imaged due to collimation off view. No associated subcutaneus soft tissue emphysema or organized fluid collection identified. Please note a necrotizing fasciitis cannot be excluded as this is a clinical diagnosis.  2. Interval development of a 1.1 cm right middle lobe pulmonary nodule.   ED Course:  Patient with findings of cellulitis, per radiology necrotizing fasciitis cannot be excluded, does not appear to have current clinical necrotizing fasciitis, the scrotum/perineum are not involved, there is no palpable crepitus.  Discussed with supervising physician Dr. Read Drivers who has evaluated the patient as a shared visit, in agreement does not appear like current clinical necrotizing fascitis- recommends abx to cover for it with admission. Blood cultures ordered as patient meets SIRS criteria, lactic WNL, no hypotension. Consult placed to hospitalist service.   04:06: CONSULT: Discussed wit hospitalist Dr. Julian Reil- accepts admission.    Portions of this  note were generated with Scientist, clinical (histocompatibility and immunogenetics). Dictation errors may occur despite best attempts at proofreading.  Final Clinical Impression(s) / ED Diagnoses Final diagnoses:  Cellulitis of trunk, unspecified site of trunk    Rx / DC Orders ED Discharge Orders     None        Cherly Anderson, PA-C 02/23/21 0527    Cherly Anderson, PA-C 02/23/21 0528    Paula Libra, MD 02/23/21 [phone]

## 2021-02-24 ENCOUNTER — Other Ambulatory Visit: Payer: Self-pay | Admitting: Emergency Medicine

## 2021-02-24 DIAGNOSIS — Z881 Allergy status to other antibiotic agents status: Secondary | ICD-10-CM | POA: Diagnosis not present

## 2021-02-24 DIAGNOSIS — E119 Type 2 diabetes mellitus without complications: Secondary | ICD-10-CM | POA: Diagnosis present

## 2021-02-24 DIAGNOSIS — Z7984 Long term (current) use of oral hypoglycemic drugs: Secondary | ICD-10-CM | POA: Diagnosis not present

## 2021-02-24 DIAGNOSIS — Z880 Allergy status to penicillin: Secondary | ICD-10-CM | POA: Diagnosis not present

## 2021-02-24 DIAGNOSIS — Z833 Family history of diabetes mellitus: Secondary | ICD-10-CM | POA: Diagnosis not present

## 2021-02-24 DIAGNOSIS — Z20822 Contact with and (suspected) exposure to covid-19: Secondary | ICD-10-CM | POA: Diagnosis present

## 2021-02-24 DIAGNOSIS — L97509 Non-pressure chronic ulcer of other part of unspecified foot with unspecified severity: Secondary | ICD-10-CM | POA: Diagnosis not present

## 2021-02-24 DIAGNOSIS — E11621 Type 2 diabetes mellitus with foot ulcer: Secondary | ICD-10-CM | POA: Diagnosis not present

## 2021-02-24 DIAGNOSIS — L0390 Cellulitis, unspecified: Secondary | ICD-10-CM | POA: Diagnosis present

## 2021-02-24 DIAGNOSIS — I10 Essential (primary) hypertension: Secondary | ICD-10-CM

## 2021-02-24 DIAGNOSIS — Z79899 Other long term (current) drug therapy: Secondary | ICD-10-CM | POA: Diagnosis not present

## 2021-02-24 DIAGNOSIS — Z8614 Personal history of Methicillin resistant Staphylococcus aureus infection: Secondary | ICD-10-CM | POA: Diagnosis not present

## 2021-02-24 DIAGNOSIS — E7800 Pure hypercholesterolemia, unspecified: Secondary | ICD-10-CM | POA: Diagnosis present

## 2021-02-24 DIAGNOSIS — L03319 Cellulitis of trunk, unspecified: Secondary | ICD-10-CM | POA: Diagnosis present

## 2021-02-24 DIAGNOSIS — L03311 Cellulitis of abdominal wall: Secondary | ICD-10-CM | POA: Diagnosis present

## 2021-02-24 DIAGNOSIS — R911 Solitary pulmonary nodule: Secondary | ICD-10-CM | POA: Diagnosis present

## 2021-02-24 DIAGNOSIS — L0292 Furuncle, unspecified: Secondary | ICD-10-CM | POA: Diagnosis present

## 2021-02-24 DIAGNOSIS — Z794 Long term (current) use of insulin: Secondary | ICD-10-CM | POA: Diagnosis not present

## 2021-02-24 LAB — CBC
HCT: 42.8 % (ref 39.0–52.0)
Hemoglobin: 14.3 g/dL (ref 13.0–17.0)
MCH: 30.3 pg (ref 26.0–34.0)
MCHC: 33.4 g/dL (ref 30.0–36.0)
MCV: 90.7 fL (ref 80.0–100.0)
Platelets: 186 10*3/uL (ref 150–400)
RBC: 4.72 MIL/uL (ref 4.22–5.81)
RDW: 11.9 % (ref 11.5–15.5)
WBC: 14.6 10*3/uL — ABNORMAL HIGH (ref 4.0–10.5)
nRBC: 0 % (ref 0.0–0.2)

## 2021-02-24 LAB — GLUCOSE, CAPILLARY
Glucose-Capillary: 208 mg/dL — ABNORMAL HIGH (ref 70–99)
Glucose-Capillary: 243 mg/dL — ABNORMAL HIGH (ref 70–99)
Glucose-Capillary: 250 mg/dL — ABNORMAL HIGH (ref 70–99)
Glucose-Capillary: 213 mg/dL — ABNORMAL HIGH (ref 70–99)

## 2021-02-24 LAB — BASIC METABOLIC PANEL
Anion gap: 9 (ref 5–15)
BUN: 7 mg/dL (ref 6–20)
CO2: 24 mmol/L (ref 22–32)
Calcium: 9.1 mg/dL (ref 8.9–10.3)
Chloride: 103 mmol/L (ref 98–111)
Creatinine, Ser: 0.65 mg/dL (ref 0.61–1.24)
GFR, Estimated: >60 mL/min (ref >60)
Glucose, Bld: 228 mg/dL — ABNORMAL HIGH (ref 70–99)
Potassium: 3.6 mmol/L (ref 3.5–5.1)
Sodium: 136 mmol/L (ref 135–145)

## 2021-02-24 LAB — CREATININE, SERUM
Creatinine, Ser: 0.75 mg/dL (ref 0.61–1.24)
GFR, Estimated: >60 mL/min (ref >60)

## 2021-02-24 MED ORDER — INSULIN GLARGINE 100 UNIT/ML ~~LOC~~ SOLN
25.0000 [IU] | Freq: Every day | SUBCUTANEOUS | Status: DC
  Administered 2021-02-24: 25 [IU] via SUBCUTANEOUS
  Filled 2021-02-24: qty 0.25

## 2021-02-24 MED ORDER — INSULIN ASPART 100 UNIT/ML IJ SOLN
3.0000 [IU] | Freq: Three times a day (TID) | INTRAMUSCULAR | Status: DC
  Administered 2021-02-24 – 2021-02-25 (×3): 3 [IU] via SUBCUTANEOUS

## 2021-02-24 NOTE — Progress Notes (Signed)
[facility]  PROGRESS NOTE  Dustin Baker VOZ:[id number] DOB: June 20, 1971 DOA: 02/22/2021 PCP: [facility]   Brief HPI:   50 year old male with medical history of diabetes mellitus, hypertension presented with suprapubic erythema, fever, chills, nausea.  Denies testicular pain.  Patient admitted with suprapubic cellulitis.  Started on vancomycin and meropenem.    Subjective   Patient seen and examined, says that redness has improved.  Denies any pain.   Assessment/Plan:    Mons pubis cellulitis -Likely from furuncle noted at upper right corner -Improving on antibiotics -Continue vancomycin, ceftriaxone -WBC is down to 14,000  Diabetes mellitus type 2 -Uncontrolled, hemoglobin A1c 10.5 -Patient takes metformin at home -CBGs elevated; increase Lantus to 25 units subcu daily -Add NovoLog 3 units 3 times daily meal coverage -Continue moderate sliding scale insulin with NovoLog  Hypertension -Blood pressure is stable -HCTZ/lisinopril on hold  Pulmonary nodule -Seen by pulmonology -Will need follow-up CT chest in 6 months -To follow-up with pulmonology as outpatient   Scheduled medications:    enoxaparin (LOVENOX) injection  60 mg Subcutaneous Q24H   insulin aspart  0-15 Units Subcutaneous TID WC   insulin aspart  3 Units Subcutaneous TID WC   insulin glargine  25 Units Subcutaneous QHS         Data Reviewed:   CBG:  Recent Labs  Lab 02/23/21 1122 02/23/21 1713 02/23/21 2154 02/24/21 0738 02/24/21 1134  GLUCAP 221* 196* 225* 208* 243*    SpO2: 97 %    Vitals:   02/23/21 2043 02/24/21 0040 02/24/21 0525 02/24/21 1356  BP: 120/84 121/86 120/87 119/82  Pulse: 91 84 85 84  Resp: 18 18 18 18   Temp: 98.9 F (37.2 C) 98.6 F (37 C) 98.8 F (37.1 C) 98.4 F (36.9 C)  TempSrc:   Oral   SpO2: 97% 97% 97% 97%  Weight:      Height:         Intake/Output Summary (Last 24 hours) at 02/24/2021 1405 Last data filed at 02/24/2021  1400 Gross per 24 hour  Intake 1900.08 ml  Output --  Net 1900.08 ml    06/22 1901 - 06/24 0700 In: 1300.1 [P.O.:600] Out: -   Filed Weights   02/23/21 0435 02/23/21 1211  Weight: 120 kg 103.8 kg    CBC:  Recent Labs  Lab 02/22/21 2307 02/23/21 0511 02/24/21 0909  WBC 16.0* 15.2* 14.6*  HGB 15.6 14.3 14.3  HCT 44.8 41.8 42.8  PLT 213 186 186  MCV 87.7 89.1 90.7  MCH 30.5 30.5 30.3  MCHC 34.8 34.2 33.4  RDW 11.9 12.0 11.9  LYMPHSABS 1.8  --   --   MONOABS 1.0  --   --   EOSABS 0.1  --   --   BASOSABS 0.1  --   --     Complete metabolic panel:  Recent Labs  Lab 02/22/21 2307 02/23/21 0511 02/24/21 0456 02/24/21 0909  NA 132* 135  --  136  K 4.1 3.7  --  3.6  CL 97* 105  --  103  CO2 23 22  --  24  GLUCOSE 274* 222*  --  228*  BUN 15 10  --  7  CREATININE 0.92 0.70 0.75 0.65  CALCIUM 9.6 8.8*  --  9.1  AST 18  --   --   --   ALT 25  --   --   --   ALKPHOS 43  --   --   --  BILITOT 0.9  --   --   --   ALBUMIN 4.3  --   --   --   LATICACIDVEN 1.8  --   --   --   HGBA1C  --  10.5*  --   --     Recent Labs  Lab 02/22/21 2307  LIPASE 31    Recent Labs  Lab 02/23/21 0450  SARSCOV2NAA NEGATIVE    ------------------------------------------------------------------------------------------------------------------ No results for input(s): CHOL, HDL, LDLCALC, TRIG, CHOLHDL, LDLDIRECT in the last 72 hours.  Lab Results  Component Value Date   HGBA1C 10.5 (H) 02/23/2021   ------------------------------------------------------------------------------------------------------------------ No results for input(s): TSH, T4TOTAL, T3FREE, THYROIDAB in the last 72 hours.  Invalid input(s): FREET3 ------------------------------------------------------------------------------------------------------------------ No results for input(s): VITAMINB12, FOLATE, FERRITIN, TIBC, IRON, RETICCTPCT in the last 72 hours.  Coagulation profile No results for input(s):  INR, PROTIME in the last 168 hours. No results for input(s): DDIMER in the last 72 hours.  Cardiac Enzymes No results for input(s): CKTOTAL, CKMB, CKMBINDEX, TROPONINI in the last 168 hours.  ------------------------------------------------------------------------------------------------------------------ No results found for: BNP   Antibiotics: Anti-infectives (From admission, onward)    Start     Dose/Rate Route Frequency Ordered Stop   02/23/21 2100  cefTRIAXone (ROCEPHIN) 2 g in sodium chloride 0.9 % 100 mL IVPB        2 g 200 mL/hr over 30 Minutes Intravenous Every 24 hours 02/23/21 1314     02/23/21 1500  vancomycin (VANCOREADY) IVPB 1250 mg/250 mL  Status:  Discontinued        1,250 mg 166.7 mL/hr over 90 Minutes Intravenous Every 12 hours 02/23/21 0453 02/23/21 1300   02/23/21 1500  vancomycin (VANCOREADY) IVPB 1250 mg/250 mL        1,250 mg 125 mL/hr over 120 Minutes Intravenous Every 12 hours 02/23/21 1300     02/23/21 1300  meropenem (MERREM) 1 g in sodium chloride 0.9 % 100 mL IVPB  Status:  Discontinued        1 g 200 mL/hr over 30 Minutes Intravenous Every 8 hours 02/23/21 0453 02/23/21 1314   02/23/21 0445  vancomycin (VANCOREADY) IVPB 2000 mg/400 mL        2,000 mg 200 mL/hr over 120 Minutes Intravenous STAT 02/23/21 0442 02/23/21 1722   02/23/21 0400  vancomycin (VANCOCIN) IVPB 1000 mg/200 mL premix  Status:  Discontinued        1,000 mg 200 mL/hr over 60 Minutes Intravenous  Once 02/23/21 0353 02/23/21 0442   02/23/21 0400  meropenem (MERREM) 1 g in sodium chloride 0.9 % 100 mL IVPB        1 g 200 mL/hr over 30 Minutes Intravenous  Once 02/23/21 0353 02/23/21 0616   02/23/21 0400  clindamycin (CLEOCIN) IVPB 600 mg        600 mg 100 mL/hr over 30 Minutes Intravenous  Once 02/23/21 0353 02/23/21 0659        Radiology Reports  CT CHEST WO CONTRAST  Result Date: 02/23/2021 CLINICAL DATA:  Evaluate nodules seen on ct a/p, non-smoke. Lung nodule, > 78mm EXAM:  CT CHEST WITHOUT CONTRAST TECHNIQUE: Multidetector CT imaging of the chest was performed following the standard protocol without IV contrast. COMPARISON:  CT abdomen pelvis 02/23/2021 FINDINGS: Cardiovascular: Normal heart size. No significant pericardial effusion. The thoracic aorta is normal in caliber. Mild atherosclerotic plaque. No coronary artery calcifications. Mediastinum/Nodes: No gross hilar adenopathy, noting limited sensitivity for the detection of hilar adenopathy on this noncontrast study. No enlarged mediastinal  or axillary lymph nodes. Thyroid gland, trachea, and esophagus demonstrate no significant findings. Tiny hiatal hernia. Lungs/Pleura: Left base linear atelectasis. Redemonstration of a 1.1 cm right middle lobe pulmonary nodule (4:76). There is a 0.8 x 0.5 cm subjacent pulmonary nodule within the right middle lobe (4:94) that is more conspicuous than on the CT abdomen pelvis. No other pulmonary nodule identified. No pulmonary mass. No focal consolidation. No pleural effusion. No pneumothorax. Upper Abdomen: Please see separately dictated CT abdomen pelvis 02/23/2021. Musculoskeletal: No abdominal wall hernia or abnormality. No suspicious lytic or blastic osseous lesions. No acute displaced fracture. Multilevel degenerative changes of the spine. IMPRESSION: 1. A 1.1 cm and a 0.8 x 0.5 cm right middle lobe pulmonary nodule. Non-contrast chest CT at 3-6 months is recommended. If the nodules are stable at time of repeat CT, then future CT at 18-24 months (from today's scan) is considered optional for low-risk patients, but is recommended for high-risk patients. This recommendation follows the consensus statement: Guidelines for Management of Incidental Pulmonary Nodules Detected on CT Images: From the Fleischner Society 2017; Radiology 2017; 284:228-243. 2. Tiny hiatal hernia. 3.  Aortic Atherosclerosis (ICD10-I70.0). Electronically Signed   By: Tish Frederickson M.D.   On: 02/23/2021 06:57   CT  ABDOMEN PELVIS W CONTRAST  Result Date: 02/23/2021 CLINICAL DATA:  Lower abdominal pain. Status post bug bite in lower abdomen/right groin pain. Leukocytosis. History of a cecectomy. EXAM: CT ABDOMEN AND PELVIS WITH CONTRAST TECHNIQUE: Multidetector CT imaging of the abdomen and pelvis was performed using the standard protocol following bolus administration of intravenous contrast. CONTRAST:  OMNIPAQUE IOHEXOL 300 MG/ML  SOLN COMPARISON:  CT abdomen pelvis 02/13/2016 FINDINGS: Lower chest: Bilateral lower lobe subsegmental atelectasis. Linear atelectasis within the lingula. Interval development of a 1.1 cm right middle lobe pulmonary nodule. Tiny hiatal hernia. Hepatobiliary: No focal liver abnormality. No gallstones, gallbladder wall thickening, or pericholecystic fluid. No biliary dilatation. Pancreas: No focal lesion. Normal pancreatic contour. No surrounding inflammatory changes. No main pancreatic ductal dilatation. Spleen: Normal in size without focal abnormality. Adrenals/Urinary Tract: No adrenal nodule bilaterally. Bilateral kidneys enhance symmetrically. No hydronephrosis. No hydroureter. The urinary bladder is unremarkable. On delayed imaging, there is no urothelial wall thickening and there are no filling defects in the opacified portions of the bilateral collecting systems or ureters. Stomach/Bowel: Stomach is within normal limits. No evidence of bowel wall thickening or dilatation. Appendix appears normal. Vascular/Lymphatic: No abdominal aorta or iliac aneurysm. Mild atherosclerotic plaque. No abdominal, pelvic, or inguinal lymphadenopathy. Reproductive: Prostate is unremarkable. Other: No intraperitoneal free fluid. No intraperitoneal free gas. No organized fluid collection. Musculoskeletal: Subcutaneus soft tissue edema along the mons pubis and inferiorly. Visualized portions of the perineum, perirectal region, and gluteal soft tissues demonstrate no inflammatory changes. No organized fluid  collection identified. No free gas noted. No suspicious lytic or blastic osseous lesions. No acute displaced fracture. Multilevel degenerative changes of the spine. IMPRESSION: 1. Subcutaneus soft tissue edema along the mons pubis and inferiorly. Please note that the penis is only partially visualized and the scrota are not imaged due to collimation off view. No associated subcutaneus soft tissue emphysema or organized fluid collection identified. Please note a necrotizing fasciitis cannot be excluded as this is a clinical diagnosis. 2. Interval development of a 1.1 cm right middle lobe pulmonary nodule. Consider one of the following in 3 months for both low-risk and high-risk individuals: (a) repeat chest CT, (b) follow-up PET-CT, or (c) tissue sampling. This recommendation follows the consensus statement:  Guidelines for Management of Incidental Pulmonary Nodules Detected on CT Images: From the Fleischner Society 2017; Radiology 2017; 284:228-243. Electronically Signed   By: Tish FredericksonMorgane  Naveau M.D.   On: 02/23/2021 03:06      DVT prophylaxis: Lovenox  Code Status: Full code  Family Communication: No family at bedside   Consultants:   Procedures:     Objective    Physical Examination:   General-appears in no acute distress Heart-S1-S2, regular, no murmur auscultated Lungs-clear to auscultation bilaterally, no wheezing or crackles auscultated Abdomen-soft, nontender, no organomegaly Extremities-no edema in the lower extremities Neuro-alert, oriented x3, no focal deficit noted Skin-mild erythema noted at mons pubis, also induration noted on upper right corner Status is: Inpatient  Dispo: The patient is from: Home              Anticipated d/c is to: Home              Anticipated d/c date is: 02/26/2021              Patient currently not stable for discharge  Barrier to discharge-ongoing treatment for cellulitis  COVID-19 Labs  No results for input(s): DDIMER, FERRITIN, LDH, CRP in  the last 72 hours.  Lab Results  Component Value Date   SARSCOV2NAA NEGATIVE 02/23/2021    Microbiology  Recent Results (from the past 240 hour(s))  Resp Panel by RT-PCR (Flu A&B, Covid) Nasopharyngeal Swab     Status: None   Collection Time: 02/23/21  4:50 AM   Specimen: Nasopharyngeal Swab; Nasopharyngeal(NP) swabs in vial transport medium  Result Value Ref Range Status   SARS Coronavirus 2 by RT PCR NEGATIVE NEGATIVE Final    Comment: (NOTE) SARS-CoV-2 target nucleic acids are NOT DETECTED.  The SARS-CoV-2 RNA is generally detectable in upper respiratory specimens during the acute phase of infection. The lowest concentration of SARS-CoV-2 viral copies this assay can detect is 138 copies/mL. A negative result does not preclude SARS-Cov-2 infection and should not be used as the sole basis for treatment or other patient management decisions. A negative result may occur with  improper specimen collection/handling, submission of specimen other than nasopharyngeal swab, presence of viral mutation(s) within the areas targeted by this assay, and inadequate number of viral copies(<138 copies/mL). A negative result must be combined with clinical observations, patient history, and epidemiological information. The expected result is Negative.  Fact Sheet for Patients:  BloggerCourse.comhttps://www.fda.gov/media/152166/download  Fact Sheet for Healthcare Providers:  SeriousBroker.ithttps://www.fda.gov/media/152162/download  This test is no t yet approved or cleared by the [place] FDA and  has been authorized for detection and/or diagnosis of SARS-CoV-2 by FDA under an Emergency Use Authorization (EUA). This EUA will remain  in effect (meaning this test can be used) for the duration of the COVID-19 declaration under Section 564(b)(1) of the Act, 21 U.S.C.section 360bbb-3(b)(1), unless the authorization is terminated  or revoked sooner.       Influenza A by PCR NEGATIVE NEGATIVE Final   Influenza B by PCR  NEGATIVE NEGATIVE Final    Comment: (NOTE) The Xpert Xpress SARS-CoV-2/FLU/RSV plus assay is intended as an aid in the diagnosis of influenza from Nasopharyngeal swab specimens and should not be used as a sole basis for treatment. Nasal washings and aspirates are unacceptable for Xpert Xpress SARS-CoV-2/FLU/RSV testing.  Fact Sheet for Patients: BloggerCourse.comhttps://www.fda.gov/media/152166/download  Fact Sheet for Healthcare Providers: SeriousBroker.ithttps://www.fda.gov/media/152162/download  This test is not yet approved or cleared by the [place] FDA and has been authorized for detection and/or diagnosis of  SARS-CoV-2 by FDA under an Emergency Use Authorization (EUA). This EUA will remain in effect (meaning this test can be used) for the duration of the COVID-19 declaration under Section 564(b)(1) of the Act, 21 U.S.C. section 360bbb-3(b)(1), unless the authorization is terminated or revoked.  Performed at [facility], [address] [place], [place] [address]              Meredeth Ide   [facility] If 7PM-7AM, please contact night-coverage at [email or web address], Office  [phone]   02/24/2021, 2:05 PM  LOS: 0 days

## 2021-02-24 NOTE — Progress Notes (Signed)
   NAME:  Dustin Baker, MRN:  [id number], DOB:  May 23, 1971, LOS: 0 ADMISSION DATE:  02/22/2021, CONSULTATION DATE:  02/23/21 REFERRING MD:  Dr Julian Reil, CHIEF COMPLAINT:  pulmonary nodule   History of Present Illness:  50 year old never smoker with history of hypertension and type 2 diabetes with associated neuropathy, prior MRSA diabetic foot ulcer (treated).  He is admitted now with right suprapubic erythema consistent with cellulitis.  His evaluation has included a CT scan of the abdomen that identified a 1.1 cm right middle lobe pulmonary nodule, 0.8 cm adjacent pulmonary nodule right middle lobe.  Both new compared with CT abdomen 02/13/2016.   Subsequent CT chest performed 6/22 as below, confirmed the presence of these nodules.  We are consulted for further evaluation.  Pertinent  Medical History   Past Medical History:  Diagnosis Date   Allergy    Diabetes mellitus without complication (HCC)    High cholesterol    Hives of unknown origin    Hypertension      Significant Hospital Events: Including procedures, antibiotic start and stop dates in addition to other pertinent events   CT scan of the chest was reviewed with the patient  Interim History / Subjective:  No overnight events, feeling better Denies any cough  Objective   Blood pressure 120/87, pulse 85, temperature 98.8 F (37.1 C), temperature source Oral, resp. rate 18, height 5' 10.5" (1.791 m), weight 103.8 kg, SpO2 97 %.        Intake/Output Summary (Last 24 hours) at 02/24/2021 1039 Last data filed at 02/24/2021 0934 Gross per 24 hour  Intake 1540.08 ml  Output --  Net 1540.08 ml   Filed Weights   02/23/21 0435 02/23/21 1211  Weight: 120 kg 103.8 kg    Examination: General: Not in any distress HENT: Moist oral mucosa Lungs: Clear breath sounds Cardiovascular: S1-S2 appreciated Abdomen: Bowel sounds appreciated Extremities: No clubbing, no edema Neuro: Alert and oriented GU: Fair  output  Labs/imaging that I have personally reviewed  (right click and "Reselect all SmartList Selections" daily)  CT scan was reviewed with the patient showing a right middle lobe nodule of 1.1 cm, and adjacent nodule noted  Resolved Hospital Problem list     Assessment & Plan:  Right middle lobe lung nodule -Low risk patient with no personal history of cancer, no occupational predisposition, no family history of lung cancer  Recommendation already in place by Dr. Delton Coombes we will follow-up with the patient in 6 months  Possibility of bronchoscopy if any change noted  May be discharged at any time from a pulmonary perspective  Encouraged to ensure follow-up with Dr. Kathreen Cosier, MD Normal PCCM Pager: [phone]

## 2021-02-24 NOTE — Progress Notes (Signed)
Inpatient Diabetes Program Recommendations  AACE/ADA: New Consensus Statement on Inpatient Glycemic Control (2015)  Target Ranges:  Prepandial:   less than 140 mg/dL      Peak postprandial:   less than 180 mg/dL (1-2 hours)      Critically ill patients:  140 - 180 mg/dL   Lab Results  Component Value Date   GLUCAP 243 (H) 02/24/2021   HGBA1C 10.5 (H) 02/23/2021    Review of Glycemic Control Results for Dustin Baker, Dustin Baker (MRN [id number]) as of 02/24/2021 12:25  Ref. Range 02/23/2021 11:22 02/23/2021 17:13 02/23/2021 21:54 02/24/2021 07:38 02/24/2021 11:34  Glucose-Capillary Latest Ref Range: 70 - 99 mg/dL 382 (H) 505 (H) 397 (H) 208 (H) 243 (H)  Diabetes history: DM 2 Outpatient Diabetes medications:  None noted- will discuss with patient  Current orders for Inpatient glycemic control:  Novolog moderate tid with meals and HS Lantus 20 units q HS Inpatient Diabetes Program Recommendations:    Please consider increasing Lantus to 25 units q HS.  Also consider adding Novolog meal coverage 3 units tid with meals.   Thanks,  Beryl Meager, RN, BC-ADM Inpatient Diabetes Coordinator Pager [phone]  (8a-5p)

## 2021-02-25 LAB — CBC
HCT: 43.4 % (ref 39.0–52.0)
Hemoglobin: 14.3 g/dL (ref 13.0–17.0)
MCH: 30.2 pg (ref 26.0–34.0)
MCHC: 32.9 g/dL (ref 30.0–36.0)
MCV: 91.8 fL (ref 80.0–100.0)
Platelets: 206 10*3/uL (ref 150–400)
RBC: 4.73 MIL/uL (ref 4.22–5.81)
RDW: 11.9 % (ref 11.5–15.5)
WBC: 10.5 10*3/uL (ref 4.0–10.5)
nRBC: 0 % (ref 0.0–0.2)

## 2021-02-25 LAB — BASIC METABOLIC PANEL
Anion gap: 8 (ref 5–15)
BUN: 8 mg/dL (ref 6–20)
CO2: 25 mmol/L (ref 22–32)
Calcium: 9.1 mg/dL (ref 8.9–10.3)
Chloride: 104 mmol/L (ref 98–111)
Creatinine, Ser: 0.77 mg/dL (ref 0.61–1.24)
GFR, Estimated: >60 mL/min (ref >60)
Glucose, Bld: 157 mg/dL — ABNORMAL HIGH (ref 70–99)
Potassium: 3.7 mmol/L (ref 3.5–5.1)
Sodium: 137 mmol/L (ref 135–145)

## 2021-02-25 LAB — GLUCOSE, CAPILLARY
Glucose-Capillary: 159 mg/dL — ABNORMAL HIGH (ref 70–99)
Glucose-Capillary: 210 mg/dL — ABNORMAL HIGH (ref 70–99)

## 2021-02-25 MED ORDER — SULFAMETHOXAZOLE-TRIMETHOPRIM 800-160 MG PO TABS
1.0000 | ORAL_TABLET | Freq: Two times a day (BID) | ORAL | Status: DC
  Administered 2021-02-25: 1 via ORAL
  Filled 2021-02-25: qty 1

## 2021-02-25 MED ORDER — SULFAMETHOXAZOLE-TRIMETHOPRIM 800-160 MG PO TABS: 1.0000 | ORAL_TABLET | Freq: Two times a day (BID) | ORAL | 0 refills | Status: AC

## 2021-02-25 MED ORDER — INSULIN GLARGINE 100 UNIT/ML SOLOSTAR PEN: 30.0000 [IU] | PEN_INJECTOR | Freq: Every day | SUBCUTANEOUS | 11 refills | Status: DC

## 2021-02-25 NOTE — Progress Notes (Signed)
Went over discharge instructions w/ pt. Pt verbalized understanding.  

## 2021-02-25 NOTE — Progress Notes (Signed)
Patient refused 0300 Vancomycin dose due to having a reaction in the ED from IV Vancomycin. Patient states he had intense and severe itching and face started to swell.

## 2021-02-25 NOTE — Discharge Summary (Signed)
Physician Discharge Summary  Dustin Baker III GEX:[id number] DOB: 24-Jan-1971 DOA: 02/22/2021  PCP: [facility]  Admit date: 02/22/2021 Discharge date: 02/25/2021  Time spent: 50 minutes  Recommendations for Outpatient Follow-up:  Follow-up PCP in 1 week   Discharge Diagnoses:  Principal Problem:   Cellulitis of trunk Active Problems:   Type 2 diabetes mellitus (HCC)   Essential hypertension   Pulmonary nodule   History of MRSA infection   Cellulitis   Discharge Condition: Stable  Diet recommendation: Heart healthy diet  Filed Weights   02/23/21 0435 02/23/21 1211  Weight: 120 kg 103.8 kg    History of present illness:   50 year old male with medical history of diabetes mellitus, hypertension presented with suprapubic erythema, fever, chills, nausea.  Denies testicular pain.  Patient admitted with suprapubic cellulitis.  Started on vancomycin and meropenem.  Hospital Course:   Mons pubis cellulitis -Likely from furuncle noted at upper right corner -Improving on antibiotics -WBC is down to 10,000 -We will discharge home on Bactrim 1 tablet p.o. twice daily for 7 more days -Recommended to you use warm compresses daily   Diabetes mellitus type 2 -Uncontrolled, hemoglobin A1c 10.5 -Patient takes metformin at home -CBGs elevated; increase Lantus to 30 units subcu daily -We will discharge on Lantus and metformin; patient to follow-up with PCP in 1 week to adjust the dose of Lantus.   Hypertension -Blood pressure is stable -We will discontinue HCTZ -Continue lisinopril 2.5 mg daily   Pulmonary nodule -Seen by pulmonology -Will need follow-up CT chest in 6 months -To follow-up with pulmonology as outpatient    Procedures:   Consultations: Pulmonology  Discharge Exam: Vitals:   02/24/21 2006 02/25/21 0514  BP: 128/88 126/88  Pulse: 82 80  Resp: 18 20  Temp: 98.6 F (37 C) 98.2 F (36.8 C)  SpO2: 98% 98%    General: Appears in no acute  distress Cardiovascular: S1-S2, regular Respiratory: Clear to auscultation bilaterally  Discharge Instructions   Discharge Instructions     Diet - low sodium heart healthy   Complete by: As directed    Discharge instructions   Complete by: As directed    Start taking Lantus 30 units subcu at bedtime daily Check your blood sugar at home, if continues to be elevated. Call your PCP and make an appointment to adjust the dose of Lantus   Increase activity slowly   Complete by: As directed       Allergies as of 02/25/2021       Reactions   Penicillins Other (See Comments)   Childhood reaction   Vancomycin Itching   Redness and itching after infusion - infuse as slower rate         Medication List     STOP taking these medications    hydrochlorothiazide 12.5 MG capsule Commonly known as: MICROZIDE   ibuprofen 200 MG tablet Commonly known as: ADVIL   mupirocin ointment 2 % Commonly known as: BACTROBAN   ondansetron 4 MG disintegrating tablet Commonly known as: Zofran ODT       TAKE these medications    gabapentin 100 MG capsule Commonly known as: NEURONTIN Take 100 mg by mouth daily.   insulin glargine 100 UNIT/ML Solostar Pen Commonly known as: LANTUS Inject 30 Units into the skin at bedtime.   lisinopril 2.5 MG tablet Commonly known as: ZESTRIL Take 2.5 mg by mouth daily. Reported on 02/13/2016   metFORMIN 500 MG 24 hr tablet Commonly known as: GLUCOPHAGE-XR Take 500  mg by mouth daily. What changed: Another medication with the same name was removed. Continue taking this medication, and follow the directions you see here.   pravastatin 40 MG tablet Commonly known as: PRAVACHOL Take 40 mg by mouth daily.   sulfamethoxazole-trimethoprim 800-160 MG tablet Commonly known as: BACTRIM DS Take 1 tablet by mouth every 12 (twelve) hours for 7 days.       Allergies  Allergen Reactions   Penicillins Other (See Comments)    Childhood reaction    Vancomycin Itching    Redness and itching after infusion - infuse as slower rate     Follow-up Information     [facility] Follow up in 1 week(s).   Contact information: [address] [place] [address] [phone]                  The results of significant diagnostics from this hospitalization (including imaging, microbiology, ancillary and laboratory) are listed below for reference.    Significant Diagnostic Studies: CT CHEST WO CONTRAST  Result Date: 02/23/2021 CLINICAL DATA:  Evaluate nodules seen on ct a/p, non-smoke. Lung nodule, > 46mm EXAM: CT CHEST WITHOUT CONTRAST TECHNIQUE: Multidetector CT imaging of the chest was performed following the standard protocol without IV contrast. COMPARISON:  CT abdomen pelvis 02/23/2021 FINDINGS: Cardiovascular: Normal heart size. No significant pericardial effusion. The thoracic aorta is normal in caliber. Mild atherosclerotic plaque. No coronary artery calcifications. Mediastinum/Nodes: No gross hilar adenopathy, noting limited sensitivity for the detection of hilar adenopathy on this noncontrast study. No enlarged mediastinal or axillary lymph nodes. Thyroid gland, trachea, and esophagus demonstrate no significant findings. Tiny hiatal hernia. Lungs/Pleura: Left base linear atelectasis. Redemonstration of a 1.1 cm right middle lobe pulmonary nodule (4:76). There is a 0.8 x 0.5 cm subjacent pulmonary nodule within the right middle lobe (4:94) that is more conspicuous than on the CT abdomen pelvis. No other pulmonary nodule identified. No pulmonary mass. No focal consolidation. No pleural effusion. No pneumothorax. Upper Abdomen: Please see separately dictated CT abdomen pelvis 02/23/2021. Musculoskeletal: No abdominal wall hernia or abnormality. No suspicious lytic or blastic osseous lesions. No acute displaced fracture. Multilevel degenerative changes of the spine. IMPRESSION: 1. A 1.1 cm and a 0.8 x 0.5 cm right middle  lobe pulmonary nodule. Non-contrast chest CT at 3-6 months is recommended. If the nodules are stable at time of repeat CT, then future CT at 18-24 months (from today's scan) is considered optional for low-risk patients, but is recommended for high-risk patients. This recommendation follows the consensus statement: Guidelines for Management of Incidental Pulmonary Nodules Detected on CT Images: From the Fleischner Society 2017; Radiology 2017; 284:228-243. 2. Tiny hiatal hernia. 3.  Aortic Atherosclerosis (ICD10-I70.0). Electronically Signed   By: Tish Frederickson M.D.   On: 02/23/2021 06:57   CT ABDOMEN PELVIS W CONTRAST  Result Date: 02/23/2021 CLINICAL DATA:  Lower abdominal pain. Status post bug bite in lower abdomen/right groin pain. Leukocytosis. History of a cecectomy. EXAM: CT ABDOMEN AND PELVIS WITH CONTRAST TECHNIQUE: Multidetector CT imaging of the abdomen and pelvis was performed using the standard protocol following bolus administration of intravenous contrast. CONTRAST:  OMNIPAQUE IOHEXOL 300 MG/ML  SOLN COMPARISON:  CT abdomen pelvis 02/13/2016 FINDINGS: Lower chest: Bilateral lower lobe subsegmental atelectasis. Linear atelectasis within the lingula. Interval development of a 1.1 cm right middle lobe pulmonary nodule. Tiny hiatal hernia. Hepatobiliary: No focal liver abnormality. No gallstones, gallbladder wall thickening, or pericholecystic fluid. No biliary dilatation. Pancreas: No focal lesion.  Normal pancreatic contour. No surrounding inflammatory changes. No main pancreatic ductal dilatation. Spleen: Normal in size without focal abnormality. Adrenals/Urinary Tract: No adrenal nodule bilaterally. Bilateral kidneys enhance symmetrically. No hydronephrosis. No hydroureter. The urinary bladder is unremarkable. On delayed imaging, there is no urothelial wall thickening and there are no filling defects in the opacified portions of the bilateral collecting systems or ureters. Stomach/Bowel:  Stomach is within normal limits. No evidence of bowel wall thickening or dilatation. Appendix appears normal. Vascular/Lymphatic: No abdominal aorta or iliac aneurysm. Mild atherosclerotic plaque. No abdominal, pelvic, or inguinal lymphadenopathy. Reproductive: Prostate is unremarkable. Other: No intraperitoneal free fluid. No intraperitoneal free gas. No organized fluid collection. Musculoskeletal: Subcutaneus soft tissue edema along the mons pubis and inferiorly. Visualized portions of the perineum, perirectal region, and gluteal soft tissues demonstrate no inflammatory changes. No organized fluid collection identified. No free gas noted. No suspicious lytic or blastic osseous lesions. No acute displaced fracture. Multilevel degenerative changes of the spine. IMPRESSION: 1. Subcutaneus soft tissue edema along the mons pubis and inferiorly. Please note that the penis is only partially visualized and the scrota are not imaged due to collimation off view. No associated subcutaneus soft tissue emphysema or organized fluid collection identified. Please note a necrotizing fasciitis cannot be excluded as this is a clinical diagnosis. 2. Interval development of a 1.1 cm right middle lobe pulmonary nodule. Consider one of the following in 3 months for both low-risk and high-risk individuals: (a) repeat chest CT, (b) follow-up PET-CT, or (c) tissue sampling. This recommendation follows the consensus statement: Guidelines for Management of Incidental Pulmonary Nodules Detected on CT Images: From the Fleischner Society 2017; Radiology 2017; 284:228-243. Electronically Signed   By: Tish FredericksonMorgane  Naveau M.D.   On: 02/23/2021 03:06    Microbiology: Recent Results (from the past 240 hour(s))  Resp Panel by RT-PCR (Flu A&B, Covid) Nasopharyngeal Swab     Status: Dustin Baker   Collection Time: 02/23/21  4:50 AM   Specimen: Nasopharyngeal Swab; Nasopharyngeal(NP) swabs in vial transport medium  Result Value Ref Range Status   SARS  Coronavirus 2 by RT PCR NEGATIVE NEGATIVE Final    Comment: (NOTE) SARS-CoV-2 target nucleic acids are NOT DETECTED.  The SARS-CoV-2 RNA is generally detectable in upper respiratory specimens during the acute phase of infection. The lowest concentration of SARS-CoV-2 viral copies this assay can detect is 138 copies/mL. A negative result does not preclude SARS-Cov-2 infection and should not be used as the sole basis for treatment or other patient management decisions. A negative result may occur with  improper specimen collection/handling, submission of specimen other than nasopharyngeal swab, presence of viral mutation(s) within the areas targeted by this assay, and inadequate number of viral copies(<138 copies/mL). A negative result must be combined with clinical observations, patient history, and epidemiological information. The expected result is Negative.  Fact Sheet for Patients:  BloggerCourse.comhttps://www.fda.gov/media/152166/download  Fact Sheet for Healthcare Providers:  SeriousBroker.ithttps://www.fda.gov/media/152162/download  This test is no t yet approved or cleared by the [place] FDA and  has been authorized for detection and/or diagnosis of SARS-CoV-2 by FDA under an Emergency Use Authorization (EUA). This EUA will remain  in effect (meaning this test can be used) for the duration of the COVID-19 declaration under Section 564(b)(1) of the Act, 21 U.S.C.section 360bbb-3(b)(1), unless the authorization is terminated  or revoked sooner.       Influenza A by PCR NEGATIVE NEGATIVE Final   Influenza B by PCR NEGATIVE NEGATIVE Final    Comment: (NOTE) The  Xpert Xpress SARS-CoV-2/FLU/RSV plus assay is intended as an aid in the diagnosis of influenza from Nasopharyngeal swab specimens and should not be used as a sole basis for treatment. Nasal washings and aspirates are unacceptable for Xpert Xpress SARS-CoV-2/FLU/RSV testing.  Fact Sheet for  Patients: BloggerCourse.com  Fact Sheet for Healthcare Providers: SeriousBroker.it  This test is not yet approved or cleared by the [place] FDA and has been authorized for detection and/or diagnosis of SARS-CoV-2 by FDA under an Emergency Use Authorization (EUA). This EUA will remain in effect (meaning this test can be used) for the duration of the COVID-19 declaration under Section 564(b)(1) of the Act, 21 U.S.C. section 360bbb-3(b)(1), unless the authorization is terminated or revoked.  Performed at [facility], [address], [place], [place] [address]   Blood culture (routine x 2)     Status: Dustin Baker (Preliminary result)   Collection Time: 02/23/21  5:11 AM   Specimen: BLOOD RIGHT HAND  Result Value Ref Range Status   Specimen Description   Final    BLOOD RIGHT HAND Performed at [facility], [address], [place], [place] [address]    Special Requests   Final    BOTTLES DRAWN AEROBIC AND ANAEROBIC Blood Culture results may not be optimal due to an inadequate volume of blood received in culture bottles Performed at [facility], [address] [place], [place] [address]    Culture   Final    NO GROWTH 2 DAYS Performed at [facility], [address], [place], [place] [address]    Report Status PENDING  Incomplete     Labs: Basic Metabolic Panel: Recent Labs  Lab 02/22/21 2307 02/23/21 0511 02/24/21 0456 02/24/21 0909 02/25/21 0559  NA 132* 135  --  136 137  K 4.1 3.7  --  3.6 3.7  CL 97* 105  --  103 104  CO2 23 22  --  24 25  GLUCOSE 274* 222*  --  228* 157*  BUN 15 10  --  7 8  CREATININE 0.92 0.70 0.75 0.65 0.77  CALCIUM 9.6 8.8*  --  9.1 9.1   Liver Function Tests: Recent Labs  Lab 02/22/21 2307  AST 18  ALT 25  ALKPHOS 43  BILITOT 0.9  PROT 8.0  ALBUMIN 4.3   Recent Labs  Lab 02/22/21 2307  LIPASE 31   No results for  input(s): AMMONIA in the last 168 hours. CBC: Recent Labs  Lab 02/22/21 2307 02/23/21 0511 02/24/21 0909 02/25/21 0559  WBC 16.0* 15.2* 14.6* 10.5  NEUTROABS 13.1*  --   --   --   HGB 15.6 14.3 14.3 14.3  HCT 44.8 41.8 42.8 43.4  MCV 87.7 89.1 90.7 91.8  PLT 213 186 186 206   Cardiac Enzymes: No results for input(s): CKTOTAL, CKMB, CKMBINDEX, TROPONINI in the last 168 hours. BNP: BNP (last 3 results) No results for input(s): BNP in the last 8760 hours.  ProBNP (last 3 results) No results for input(s): PROBNP in the last 8760 hours.  CBG: Recent Labs  Lab 02/24/21 0738 02/24/21 1134 02/24/21 1651 02/24/21 2104 02/25/21 0749  GLUCAP 208* 243* 250* 213* 159*       Signed:  Meredeth Ide MD.  [facility] 02/25/2021, 11:33 AM

## 2021-02-28 LAB — CULTURE, BLOOD (ROUTINE X 2): Culture: NO GROWTH

## 2021-06-26 ENCOUNTER — Encounter: Payer: Self-pay | Admitting: Podiatry

## 2021-06-26 ENCOUNTER — Ambulatory Visit (INDEPENDENT_AMBULATORY_CARE_PROVIDER_SITE_OTHER): Payer: 59 | Admitting: Podiatry

## 2021-06-26 ENCOUNTER — Other Ambulatory Visit: Payer: Self-pay

## 2021-06-26 DIAGNOSIS — L97521 Non-pressure chronic ulcer of other part of left foot limited to breakdown of skin: Secondary | ICD-10-CM

## 2021-06-26 DIAGNOSIS — E1165 Type 2 diabetes mellitus with hyperglycemia: Secondary | ICD-10-CM | POA: Diagnosis not present

## 2021-06-26 MED ORDER — MEDIHONEY WOUND/BURN DRESSING EX GEL: 1.0000 g | Freq: Every day | CUTANEOUS | 2 refills | Status: DC

## 2021-06-26 NOTE — Progress Notes (Signed)
  Subjective:  Patient ID: Dustin Baker, male    DOB: 1971-04-21,   MRN: [id number]  Chief Complaint  Patient presents with   Foot Ulcer    Left bottom foot. Patient states it's doing much better than before. No concerns voiced. Denies nausea, vomiting, fever and chills.     50 y.o. male presents for concern of ulcer on bottom of left foot that has been there a few weeks. States it started as a blister and has bene improving. Has been treating himself with prism over the area that he received from wound care when they were treating his right foot. Relates sugars are better controlled however last A1c was 10.5 four months ago. Denies any other pedal complaints. Denies n/v/f/c.   Past Medical History:  Diagnosis Date   Allergy    Diabetes mellitus without complication (HCC)    High cholesterol    Hives of unknown origin    Hypertension     Objective:  Physical Exam: Vascular: DP/PT pulses 2/4 bilateral. CFT <3 seconds. Normal hair growth on digits. No edema.  Skin. No lacerations or abrasions bilateral feet. Hyperkeraotic lesion with underlying wound measuring 1.1 cm x 1 cm  x 0.1 cm on the plantar 3rd metatarsal. Granular wound base. No erythema edema or purulence noted.  Musculoskeletal: MMT 5/5 bilateral lower extremities in DF, PF, Inversion and Eversion. Deceased ROM in DF of ankle joint.  Neurological: Sensation intact to light touch.   Assessment:   1. Uncontrolled type 2 diabetes mellitus with hyperglycemia (HCC)   2. Skin ulcer of foot, left, limited to breakdown of skin [facility])      Plan:  Patient was evaluated and treated and all questions answered. Ulcer left plantar 3rd metatarsal  -Debridement as below. -Dressed with medihoney, DSD. -Continue off-loading with surgical shoe. -No abx indicated.  -Discussed glucose control and proper protein-rich diet.  -Discussed if any worsening redness, pain, fever or chills to call or may need to report to the emergency room.  Patient expressed understanding.  Return in 2 weeks for wound check   Procedure: Excisional Debridement of Wound Rationale: Removal of non-viable soft tissue from the wound to promote healing.  Anesthesia: none Pre-Debridement Wound Measurements: Hyperkeratotic tissue surrounding.  Post-Debridement Wound Measurements: 1.1 cm x 1 cm x 0.1 cm  Type of Debridement: Sharp Excisional Tissue Removed: Non-viable soft tissue Depth of Debridement: subcutaneous tissue. Technique: Sharp excisional debridement to bleeding, viable wound base.  Dressing: Dry, sterile, compression dressing. Disposition: Patient tolerated procedure well. Patient to return in 2 week for follow-up.  Return in about 2 weeks (around 07/10/2021) for wound check.   Louann Sjogren, DPM

## 2021-07-10 ENCOUNTER — Ambulatory Visit: Payer: 59 | Admitting: Podiatry

## 2021-08-10 ENCOUNTER — Ambulatory Visit (HOSPITAL_COMMUNITY): Payer: Self-pay

## 2021-09-05 ENCOUNTER — Emergency Department (HOSPITAL_COMMUNITY)
Admission: EM | Admit: 2021-09-05 | Discharge: 2021-09-06 | Disposition: A | Discharge status: Home / Self Care | Payer: Self-pay | Attending: Emergency Medicine | Admitting: Emergency Medicine

## 2021-09-05 ENCOUNTER — Encounter (HOSPITAL_COMMUNITY): Payer: Self-pay

## 2021-09-05 ENCOUNTER — Other Ambulatory Visit: Payer: Self-pay

## 2021-09-05 DIAGNOSIS — T50902A Poisoning by unspecified drugs, medicaments and biological substances, intentional self-harm, initial encounter: Secondary | ICD-10-CM | POA: Diagnosis present

## 2021-09-05 DIAGNOSIS — T43012A Poisoning by tricyclic antidepressants, intentional self-harm, initial encounter: Secondary | ICD-10-CM | POA: Insufficient documentation

## 2021-09-05 DIAGNOSIS — Z794 Long term (current) use of insulin: Secondary | ICD-10-CM | POA: Insufficient documentation

## 2021-09-05 DIAGNOSIS — Z7984 Long term (current) use of oral hypoglycemic drugs: Secondary | ICD-10-CM | POA: Insufficient documentation

## 2021-09-05 DIAGNOSIS — E119 Type 2 diabetes mellitus without complications: Secondary | ICD-10-CM | POA: Insufficient documentation

## 2021-09-05 DIAGNOSIS — T50992A Poisoning by other drugs, medicaments and biological substances, intentional self-harm, initial encounter: Secondary | ICD-10-CM | POA: Insufficient documentation

## 2021-09-05 LAB — PROTIME-INR
INR: 1.1 (ref 0.8–1.2)
Prothrombin Time: 14.6 seconds (ref 11.4–15.2)

## 2021-09-05 LAB — COMPREHENSIVE METABOLIC PANEL
ALT: 20 U/L (ref 0–44)
AST: 19 U/L (ref 15–41)
Albumin: 3.9 g/dL (ref 3.5–5.0)
Alkaline Phosphatase: 48 U/L (ref 38–126)
Anion gap: 10 (ref 5–15)
BUN: 14 mg/dL (ref 6–20)
CO2: 22 mmol/L (ref 22–32)
Calcium: 9.1 mg/dL (ref 8.9–10.3)
Chloride: 101 mmol/L (ref 98–111)
Creatinine, Ser: 0.85 mg/dL (ref 0.61–1.24)
GFR, Estimated: >60 mL/min (ref >60)
Glucose, Bld: 371 mg/dL — ABNORMAL HIGH (ref 70–99)
Potassium: 4.1 mmol/L (ref 3.5–5.1)
Sodium: 133 mmol/L — ABNORMAL LOW (ref 135–145)
Total Bilirubin: 0.7 mg/dL (ref 0.3–1.2)
Total Protein: 7.8 g/dL (ref 6.5–8.1)

## 2021-09-05 LAB — URINALYSIS, ROUTINE W REFLEX MICROSCOPIC
Bacteria, UA: NONE SEEN
Bilirubin Urine: NEGATIVE
Glucose, UA: >=500 mg/dL — AB
Ketones, ur: 20 mg/dL — AB
Leukocytes,Ua: NEGATIVE
Nitrite: NEGATIVE
Protein, ur: NEGATIVE mg/dL
Specific Gravity, Urine: 1.028 (ref 1.005–1.030)
pH: 5 (ref 5.0–8.0)

## 2021-09-05 LAB — RAPID URINE DRUG SCREEN, HOSP PERFORMED
Amphetamines: NOT DETECTED
Barbiturates: NOT DETECTED
Benzodiazepines: NOT DETECTED
Cocaine: NOT DETECTED
Opiates: NOT DETECTED
Tetrahydrocannabinol: NOT DETECTED

## 2021-09-05 LAB — CBG MONITORING, ED
Glucose-Capillary: 175 mg/dL — ABNORMAL HIGH (ref 70–99)
Glucose-Capillary: 233 mg/dL — ABNORMAL HIGH (ref 70–99)
Glucose-Capillary: 316 mg/dL — ABNORMAL HIGH (ref 70–99)
Glucose-Capillary: 339 mg/dL — ABNORMAL HIGH (ref 70–99)
Glucose-Capillary: 353 mg/dL — ABNORMAL HIGH (ref 70–99)

## 2021-09-05 LAB — CBC WITH DIFFERENTIAL/PLATELET
Abs Immature Granulocytes: 0.01 10*3/uL (ref 0.00–0.07)
Basophils Absolute: 0.1 10*3/uL (ref 0.0–0.1)
Basophils Relative: 1 %
Eosinophils Absolute: 0.2 10*3/uL (ref 0.0–0.5)
Eosinophils Relative: 4 %
HCT: 45.2 % (ref 39.0–52.0)
Hemoglobin: 15.2 g/dL (ref 13.0–17.0)
Immature Granulocytes: 0 %
Lymphocytes Relative: 37 %
Lymphs Abs: 2.4 10*3/uL (ref 0.7–4.0)
MCH: 29.5 pg (ref 26.0–34.0)
MCHC: 33.6 g/dL (ref 30.0–36.0)
MCV: 87.6 fL (ref 80.0–100.0)
Monocytes Absolute: 0.5 10*3/uL (ref 0.1–1.0)
Monocytes Relative: 9 %
Neutro Abs: 3.1 10*3/uL (ref 1.7–7.7)
Neutrophils Relative %: 49 %
Platelets: 227 10*3/uL (ref 150–400)
RBC: 5.16 MIL/uL (ref 4.22–5.81)
RDW: 12.4 % (ref 11.5–15.5)
WBC: 6.3 10*3/uL (ref 4.0–10.5)
nRBC: 0 % (ref 0.0–0.2)

## 2021-09-05 LAB — ETHANOL: Alcohol, Ethyl (B): <10 mg/dL (ref <10)

## 2021-09-05 LAB — HEMOGLOBIN A1C
Hgb A1c MFr Bld: 11.2 % — ABNORMAL HIGH (ref 4.8–5.6)
Mean Plasma Glucose: 274.74 mg/dL

## 2021-09-05 LAB — ACETAMINOPHEN LEVEL
Acetaminophen (Tylenol), Serum: <10 ug/mL — ABNORMAL LOW (ref 10–30)
Acetaminophen (Tylenol), Serum: <10 ug/mL — ABNORMAL LOW (ref 10–30)

## 2021-09-05 LAB — SALICYLATE LEVEL: Salicylate Lvl: <7 mg/dL — ABNORMAL LOW (ref 7.0–30.0)

## 2021-09-05 MED ORDER — INSULIN ASPART 100 UNIT/ML IJ SOLN
0.0000 [IU] | Freq: Every day | INTRAMUSCULAR | Status: DC
  Filled 2021-09-05: qty 0.05

## 2021-09-05 MED ORDER — SODIUM CHLORIDE 0.9 % IV BOLUS
1000.0000 mL | Freq: Once | INTRAVENOUS | Status: AC
  Administered 2021-09-05: 1000 mL via INTRAVENOUS

## 2021-09-05 MED ORDER — INSULIN ASPART 100 UNIT/ML IJ SOLN
0.0000 [IU] | Freq: Three times a day (TID) | INTRAMUSCULAR | Status: DC
  Administered 2021-09-05: 11 [IU] via SUBCUTANEOUS
  Administered 2021-09-05: 5 [IU] via SUBCUTANEOUS
  Administered 2021-09-06: 3 [IU] via SUBCUTANEOUS
  Filled 2021-09-05: qty 0.15

## 2021-09-05 MED ORDER — INSULIN DETEMIR 100 UNIT/ML ~~LOC~~ SOLN
30.0000 [IU] | Freq: Every day | SUBCUTANEOUS | Status: DC
Start: 2021-09-05 — End: 2021-09-06
  Administered 2021-09-05 – 2021-09-06 (×2): 30 [IU] via SUBCUTANEOUS
  Filled 2021-09-05 (×2): qty 0.3

## 2021-09-05 MED ORDER — HYDROXYZINE HCL 25 MG PO TABS
50.0000 mg | ORAL_TABLET | Freq: Once | ORAL | Status: AC
Start: 2021-09-05 — End: 2021-09-05
  Administered 2021-09-05: 50 mg via ORAL
  Filled 2021-09-05: qty 2

## 2021-09-05 NOTE — Progress Notes (Addendum)
Inpatient Diabetes Program Recommendations  AACE/ADA: New Consensus Statement on Inpatient Glycemic Control (2015)  Target Ranges:  Prepandial:   less than 140 mg/dL      Peak postprandial:   less than 180 mg/dL (1-2 hours)      Critically ill patients:  140 - 180 mg/dL    Latest Reference Range & Units 09/05/21 01:03  Glucose 70 - 99 mg/dL 993 (H)  (H): Data is abnormally high    Admit Suicide Attempt with Ingestion of 15 (100mg ) Zoloft tablets along with approximately 7 (5/325) Percocet tablets  History: DM2   Home DM Meds: Levemir 30 units daily    Glipizide 2.5 mg daily   Lab glucose 371 at 1am (AG 10/ CO2 level 22)  Given 1L NS in the ED    MD- While patient awaiting TTS evaluation, please consider:  1. Start Levemir 30 units Daily--please start today  2. Start Novolog Moderate Correction Scale/ SSI (0-15 units) TID AC + HS  (Use Glycemic Control Order set)      --Will follow patient during hospitalization--  RN, MSN, CDE Diabetes Coordinator Inpatient Glycemic Control Team Team Pager: [phone] (8a-5p)

## 2021-09-05 NOTE — BH Assessment (Addendum)
Comprehensive Clinical Assessment (CCA) Note  09/05/2021 Dustin Baker [id number]  Disposition: Per Sheran Fava, FNP, pt is recommended for overnight observation.   [place] ED from 09/05/2021 in [place] DEPT ED to Hosp-Admission (Discharged) from 02/22/2021 in [facility] H 5 EAST MEDICAL UNIT  C-SSRS RISK CATEGORY High Risk No Risk      The patient demonstrates the following risk factors for suicide: Chronic risk factors for suicide include: substance use disorder. Acute risk factors for suicide include: family or marital conflict and loss (financial, interpersonal, professional). Protective factors for this patient include: positive social support, responsibility to others (children, family), and hope for the future. Considering these factors, the overall suicide risk at this point appears to be high. Patient is not appropriate for outpatient follow up.  Dustin Baker is a 51 year old male presenting to [facility] after attempting suicide by overdosing on 15 of his son anti-depressants and 7 oxycodone pills. Patient acknowledges trying to kill himself and states I just want the pain to go away. Patient reports multiple stressors however, reports trigger to overdose was due to his girlfriend lack of communication with him when she went out of town with a male friend for the [place]. Patient reports he took the pills and five minutes later he walked to his parent's house that live next door to him and had them call EMS. Patient stated, I don't really want to die. Patient also reports drinking about 10 beers that day. Patient other stressors include, financial issues, legal issues (two DUI) on parole, lost his license, relationship issues with 70 year old son and his son's mother and his current girlfriend. Patient reports feeling like he let his son and family down. Patient reports everything started crashing down about six months ago.    Patient does not have outpatient services and does not have a history of inpatient treatment. Patient lives with his 89 year old son who has now moved in with his mother. Patient parents live next door to him. Patient denies having a firearm. Patient reports this is his first suicide attempt. Patient denies SI, HI, AVH and SIB. Patient is not psychotic, manic or delusional.    Patient provides consent for TTS to contact his mother if needed.   Chief Complaint:  Chief Complaint  Patient presents with   Ingestion    intentional   Visit Diagnosis: Suicide attempt    CCA Screening, Triage and Referral (STR)  Patient Reported Information How did you hear about [place]? Family/Friend  What Is the Reason for Your Visit/Call Today? Suicide attempt  How Long Has This Been Causing You Problems? 1 wk - 1 month  What Do You Feel Would Help You the Most Today? Treatment for Depression or other mood problem   Have You Recently Had Any Thoughts About Hurting Yourself? Yes  Are You Planning to Commit Suicide/Harm Yourself At This time? No   Have you Recently Had Thoughts About [place]? No  Are You Planning to Harm Someone at This Time? No  Explanation: No data recorded  Have You Used Any Alcohol or Drugs in the Past 24 Hours? Yes  How Long Ago Did You Use Drugs or Alcohol? No data recorded What Did You Use and How Much? 10 beers   Do You Currently Have a Therapist/Psychiatrist? No  Name of Therapist/Psychiatrist: No data recorded  Have You Been Recently Discharged From Any Office Practice or Programs? No  Explanation of Discharge From Practice/Program: No data recorded  CCA Screening Triage Referral Assessment Type of Contact: Tele-Assessment  Telemedicine Service Delivery: Telemedicine service delivery: This service was provided via telemedicine using a 2-way, interactive audio and video technology  Is this Initial or Reassessment? Initial Assessment  Date  Telepsych consult ordered in [facility]:  09/05/21  Time Telepsych consult ordered in [facility]:  No data recorded Location of Assessment: [facility] ED  Provider Location: [facility] Assessment Services   Collateral Involvement: Pt consents for TTS to contact his mother for collateral and safety planning if needed   Does Patient Have a [place]? No data recorded Name and Contact of Legal Guardian: No data recorded If Minor and Not Living with Parent(s), Who has Custody? No data recorded Is CPS involved or ever been involved? No data recorded Is APS involved or ever been involved? No data recorded  Patient Determined To Be At Risk for Harm To Self or Others Based on Review of Patient Reported Information or Presenting Complaint? Yes, for Self-Harm  Method: No data recorded Availability of Means: No data recorded Intent: No data recorded Notification Required: No data recorded Additional Information for Danger to Others Potential: No data recorded Additional Comments for Danger to Others Potential: No data recorded Are There Guns or Other Weapons in Your Home? No data recorded Types of Guns/Weapons: No data recorded Are These Weapons Safely Secured?                            No data recorded Who Could Verify You Are Able To Have These Secured: No data recorded Do You Have any Outstanding Charges, Pending Court Dates, Parole/Probation? No data recorded Contacted To Inform of Risk of Harm To Self or Others: No data recorded   Does Patient Present under Involuntary Commitment? No  IVC Papers Initial File Date: No data recorded  [place] of Residence: [place]   Patient Currently Receiving the Following Services: No data recorded  Determination of Need: Emergent (2 hours)   Options For Referral: Medication Management; Outpatient Therapy; Inpatient Hospitalization; Partial Hospitalization; Intensive Outpatient Therapy     CCA Biopsychosocial Patient Reported  Schizophrenia/Schizoaffective Diagnosis in Past: No   Strengths: No data recorded  Mental Health Symptoms Depression:   Irritability; Sleep (too much or little)   Duration of Depressive symptoms:  Duration of Depressive Symptoms: Greater than two weeks   Mania:   None   Anxiety:    Worrying; Tension; Sleep; Irritability; Difficulty concentrating   Psychosis:   None   Duration of Psychotic symptoms:    Trauma:   None   Obsessions:   None   Compulsions:   None   Inattention:   None   Hyperactivity/Impulsivity:   None   Oppositional/Defiant Behaviors:   None   Emotional Irregularity:   None   Other Mood/Personality Symptoms:  No data recorded   Mental Status Exam Appearance and self-care  Stature:   Average   Weight:   Average weight   Clothing:   Neat/clean   Grooming:   Normal   Cosmetic use:   None   Posture/gait:   Normal   Motor activity:   Not Remarkable   Sensorium  Attention:   Normal   Concentration:   Normal   Orientation:   X5   Recall/memory:   Normal   Affect and Mood  Affect:   Appropriate   Mood:   Euthymic   Relating  Eye contact:   Normal   Facial  expression:   Responsive   Attitude toward examiner:   Cooperative   Thought and Language  Speech flow:  Clear and Coherent   Thought content:   Appropriate to Mood and Circumstances   Preoccupation:   None   Hallucinations:   None   Organization:  No data recorded  [employer] of Knowledge:   Good   Intelligence:   Average   Abstraction:   Normal   Judgement:   Poor   Reality Testing:   Adequate   Insight:   Fair   Decision Making:   Impulsive   Social Functioning  Social Maturity:   Responsible   Social Judgement:   Normal   Stress  Stressors:   Museum/gallery curator; Relationship   Coping Ability:   Programme researcher, broadcasting/film/video Deficits:   None   Supports:   Family     Religion:     Leisure/Recreation: Leisure / Recreation Do You Have Hobbies?: Yes Leisure and Hobbies: Cornhole  Exercise/Diet: Exercise/Diet Have You Gained or Lost A Significant Amount of Weight in the Past Six Months?: No Do You Have Any Trouble Sleeping?: No   CCA Employment/Education Employment/Work Situation: Employment / Work Situation Employment Situation: Employed Work Stressors: none Patient's Job has Been Impacted by Current Illness: No Has Patient ever Been in Passenger transport manager?: No  Education: Education Is Patient Currently Attending School?: No   CCA Family/Childhood History Family and Relationship History: Family history Marital status: Divorced Divorced, when?: two divorce Does patient have children?: Yes How many children?: 1  Childhood History:  Childhood History By whom was/is the patient raised?: Both parents Did patient suffer any verbal/emotional/physical/sexual abuse as a child?: No Did patient suffer from severe childhood neglect?: No Has patient ever been sexually abused/assaulted/raped as an adolescent or adult?: No Was the patient ever a victim of a crime or a disaster?: No Witnessed domestic violence?: No Has patient been affected by domestic violence as an adult?: No  Child/Adolescent Assessment:     CCA Substance Use Alcohol/Drug Use: Alcohol / Drug Use Pain Medications: See MAR Prescriptions: See MAR Over the Counter: See MAR History of alcohol / drug use?: Yes Negative Consequences of Use: Legal Substance #1 Name of Substance 1: ETOH 1 - Last Use / Amount: 10 beers                       ASAM's:  Six Dimensions of Multidimensional Assessment  Dimension 1:  Acute Intoxication and/or Withdrawal Potential:      Dimension 2:  Biomedical Conditions and Complications:      Dimension 3:  Emotional, Behavioral, or Cognitive Conditions and Complications:     Dimension 4:  Readiness to Change:     Dimension 5:  Relapse, Continued use,  or Continued Problem Potential:     Dimension 6:  Recovery/Living Environment:     ASAM Severity Score:    ASAM Recommended Level of Treatment: ASAM Recommended Level of Treatment: Level I Outpatient Treatment   Substance use Disorder (SUD)    Recommendations for Services/Supports/Treatments: Recommendations for Services/Supports/Treatments Recommendations For Services/Supports/Treatments: Individual Therapy  Discharge Disposition:    DSM5 Diagnoses: Patient Active Problem List   Diagnosis Date Noted   Cellulitis 02/24/2021   Pulmonary nodule 02/23/2021   History of MRSA infection 02/23/2021   Cellulitis of trunk 02/23/2021   Anginal chest pain at rest [facility]) 12/01/2013   Type 2 diabetes mellitus ([place]) 12/01/2013   Essential hypertension 12/01/2013   Hyperlipidemia 12/01/2013  Referrals to Alternative Service(s): Referred to Alternative Service(s):   Place:   Date:   Time:    Referred to Alternative Service(s):   Place:   Date:   Time:    Referred to Alternative Service(s):   Place:   Date:   Time:    Referred to Alternative Service(s):   Place:   Date:   Time:     Luther Redo, [facility]

## 2021-09-05 NOTE — ED Notes (Signed)
Pt resting in bed calmly, denies any needs. Sitter at bedside.

## 2021-09-05 NOTE — ED Provider Notes (Addendum)
Emergency Medicine Observation Re-evaluation Note  Dustin Baker is a 51 y.o. male, seen on rounds today.  Pt initially presented to the ED for complaints of Ingestion (intentional) Currently, the patient is awake, alert, eating breakfast.  Physical Exam  BP (!) 152/99    Pulse 98    Temp 99.2 F (37.3 C) (Oral)    Resp 14    SpO2 95%  Physical Exam General: No apparent distress Cardiac: Well perfused Lungs: Nonlabored Psych: No agitation  ED Course / MDM  EKG:   I have reviewed the labs performed to date as well as medications administered while in observation.  Recent changes in the last 24 hours include patient presented overnight to the emergency department after an attempted attentional overdose. Medically cleared after normal Tylenol levels, protecting airway.  Pt hyperglycemic, needing better glucose management prior to inpatient psychiatric admission. Pt's home Levemir 30 units ordered. Glycemic control order-set utilized to place the patient on sliding scale and corrective insulin. Will continue to monitor.  Plan  Current plan is for TTS consult the same.  Dustin Baker is not under involuntary commitment.     Dustin Avena, MD 09/05/21 [id number]    Dustin Avena, MD 09/05/21 [phone]

## 2021-09-05 NOTE — ED Notes (Signed)
Poison control contacted. Need repeat acetaminophen levels at 0345 (need to be under 150), CMP, asa level, narcan for resp depress, magnesium level. Repeat EKG at 6hrs- if Qtc elongated, optimize mag. Minimum 6hr observation d/t zoloft.

## 2021-09-05 NOTE — BH Assessment (Signed)
Clinician reviewed pt's chart in preparation to complete pt's MH Assessment. However, pt has not been medically cleared at this time. TTS to attempt assessment at a later time after pt has been medically cleared.

## 2021-09-05 NOTE — ED Notes (Signed)
Pt has one personal belongings bag and it is location behind the nurses station in the cabinet labelled 16-18 Resa A

## 2021-09-05 NOTE — ED Notes (Addendum)
Pt. Belongings bag moved to cabinets above 1-8 nurses station.

## 2021-09-05 NOTE — ED Provider Notes (Signed)
[facility]-EMERGENCY DEPT Provider Note   CSN: [id number] Arrival date & time: 09/05/21  0037     History  Chief Complaint  Patient presents with   Ingestion    intentional    Dustin Baker is a 51 y.o. male.  Patient is a 51 year old male with past medical history of type 2 diabetes recently started on insulin, diabetic neuropathy.  Patient presenting today for evaluation of an overdose.  Patient tells me he took 15-100mg  Zoloft tablets along with approximately 7-5/325 Percocet tablets in an attempt to harm himself and "make the pain go away".  Patient describes stressors in his life such as employment, relationship problems as stressors.  He denies having done anything like this in the past.  He does admit to alcohol consumption this evening as well.  Patient tells me he took this overdose at approximately 11:45 PM  The history is provided by the patient.  Ingestion This is a new problem. Episode onset: 11:45 PM. The problem occurs constantly. The problem has not changed since onset.Pertinent negatives include no chest pain and no shortness of breath. Nothing aggravates the symptoms. Nothing relieves the symptoms. He has tried nothing for the symptoms. The treatment provided no relief.      Home Medications Prior to Admission medications   Medication Sig Start Date End Date Taking? Authorizing Provider  gabapentin (NEURONTIN) 100 MG capsule Take 100 mg by mouth daily. 02/01/21   [provider]  insulin glargine (LANTUS) 100 UNIT/ML Solostar Pen Inject 30 Units into the skin at bedtime. 02/25/21   Meredeth Ide, MD  lisinopril (ZESTRIL) 2.5 MG tablet Take 2.5 mg by mouth daily. Reported on 02/13/2016    [provider]  metFORMIN (GLUCOPHAGE-XR) 500 MG 24 hr tablet Take 500 mg by mouth daily. 10/25/20   [provider]  pravastatin (PRAVACHOL) 40 MG tablet Take 40 mg by mouth daily. 02/19/21   [provider]  Wound Dressings  (MEDIHONEY WOUND/BURN DRESSING) GEL Apply 1 g topically daily. 06/26/21   Louann Sjogren, MD      Allergies    Penicillins and Vancomycin    Review of Systems   Review of Systems  Respiratory:  Negative for shortness of breath.   Cardiovascular:  Negative for chest pain.  All other systems reviewed and are negative.  Physical Exam Updated Vital Signs BP (!) 154/115    Pulse (!) 110    Temp 99.2 F (37.3 C) (Oral)    Resp 16    SpO2 98%  Physical Exam Vitals and nursing note reviewed.  Constitutional:      General: He is not in acute distress.    Appearance: He is well-developed. He is not diaphoretic.     Comments: Patient is awake and alert.  He is somewhat groggy but easily arouseable and appropriate.  HENT:     Head: Normocephalic and atraumatic.  Cardiovascular:     Rate and Rhythm: Normal rate and regular rhythm.     Heart sounds: No murmur heard.   No friction rub.  Pulmonary:     Effort: Pulmonary effort is normal. No respiratory distress.     Breath sounds: Normal breath sounds. No wheezing or rales.  Abdominal:     General: Bowel sounds are normal. There is no distension.     Palpations: Abdomen is soft.     Tenderness: There is no abdominal tenderness.  Musculoskeletal:        General: Normal range of motion.  Cervical back: Normal range of motion and neck supple.  Skin:    General: Skin is warm and dry.  Neurological:     Mental Status: He is alert and oriented to person, place, and time.     Coordination: Coordination normal.    ED Results / Procedures / Treatments   Labs (all labs ordered are listed, but only abnormal results are displayed) Labs Reviewed  CBG MONITORING, ED - Abnormal; Notable for the following components:      Result Value   Glucose-Capillary 353 (*)    All other components within normal limits    EKG None  Radiology No results found.  Procedures Procedures  Continuous cardiac monitoring  Medications Ordered in  ED Medications  sodium chloride 0.9 % bolus 1,000 mL (has no administration in time range)    ED Course/ Medical Decision Making/ A&P  This patient presents to the ED for concern of overdose of Tylenol and Zoloft, this involves an extensive number of treatment options, and is a complaint that carries with it a high risk of complications and morbidity.  The differential diagnosis includes depression, suicidal ideation, and overdose of Tylenol and Zoloft   Co morbidities that complicate the patient evaluation  None   Additional history obtained:  No additional history needed No external records obtained or necessary   Lab Tests:  I Ordered, and personally interpreted labs.  The pertinent results include: Laboratory studies reveal no acute abnormality.   Imaging Studies ordered:  No imaging studies indicated   Cardiac Monitoring:  The patient was maintained on a cardiac monitor.  I personally viewed and interpreted the cardiac monitored which showed an underlying rhythm of: Sinus rhythm   Medicines ordered and prescription drug management:  No medications ordered were given  I have reviewed the patients home medicines and have made adjustments as needed   Test Considered:  No other test considered or indicated   Critical Interventions:  Intravenous fluids   Consultations Obtained:  I requested consultation with the Poison Control Center and TTS,  and discussed lab and imaging findings as well as pertinent plan - they recommend: Poison control recommends 6 hours observation, 4-hour Tylenol level.  TTS recommendations pending   Problem List / ED Course:  Patient presenting here with Tylenol and Zoloft overdose.  He did this to "make his pain go away".  He reports depression and suicidal ideation with relationships and work as stressors.  We have followed poison control recommendations with 6 hours of observation and 4-hour Tylenol level which was negative.   Patient is medically cleared for TTS evaluation.   Reevaluation:  After the interventions noted above, I reevaluated the patient and found that they have :improved   Social Determinants of Health:  Depression and multiple stressors with work and family   Dispostion:  After consideration of the diagnostic results and the patients response to treatment, I feel that the patent would benefit from TTS evaluation with disposition pending    Final Clinical Impression(s) / ED Diagnoses Final diagnoses:  None    Rx / DC Orders ED Discharge Orders     None         Geoffery Lyons, MD 09/05/21 [phone]

## 2021-09-05 NOTE — ED Notes (Signed)
Pt has been wanded down by security.  °

## 2021-09-05 NOTE — ED Triage Notes (Signed)
Pt biba from home after ingesting 15 100mg  zoloft and 7 5/325 percocet in an attempt to harm himself. Currently endorses SI and nausea.

## 2021-09-06 DIAGNOSIS — T50902A Poisoning by unspecified drugs, medicaments and biological substances, intentional self-harm, initial encounter: Secondary | ICD-10-CM | POA: Diagnosis present

## 2021-09-06 LAB — CBG MONITORING, ED
Glucose-Capillary: 193 mg/dL — ABNORMAL HIGH (ref 70–99)
Glucose-Capillary: 248 mg/dL — ABNORMAL HIGH (ref 70–99)

## 2021-09-06 NOTE — Discharge Instructions (Addendum)
For your behavioral health needs, you are advised to follow up with the Partial Hospitalization Program (PHP) offered by [facility].  This program meets Monday - Friday from 9:00 am - 1:00 pm.  Due to Covid-19 this program is currently virtual.  You are scheduled for an intake appointment on Monday, September 11, 2021 at 9:00 am.  If you have any questions, contact Milana Na, [facility] at the phone number indicated below:       [facility]. [employer]. [address] [place] [address]      Contact person: Milana Na, [facility]      [phone]

## 2021-09-06 NOTE — Consult Note (Signed)
[facility] Psych ED Discharge  09/06/2021 11:07 AM Dustin Baker  MRN:  [id number]  Method of visit?: Face to Face   Principal Problem: Intentional overdose [facility]) Discharge Diagnoses: Principal Problem:   Intentional overdose (HCC)   Subjective: Dustin Baker  presented as a pleasant, down to earth male  who cooperatively participated in the interview. Dustin Baker was alert and well oriented, with no overt signs of cognitive difficulty or sedation potentially associated with medication use. His speech was of normal rate, tone, volume and fluency. His mood and affect were euthymic, modulating appropriately in accord with what he discussed. Mr. Joos responses to questions were relevant and to the point, with thought processes linear and goal directed, with no signs of thought disorder, flight of ideas, suicidal or paranoid ideation . He appears to be stable at this time and will pscyh clear, with contingency to participate in partial hospital programming. Safety planning was completed with his mother and patient.   HPI: Dustin Baker is a 51 year old male presenting to [facility] after attempting suicide by overdosing on 15 of his son anti-depressants and 7 oxycodone pills. Patient acknowledges trying to kill himself and states I just want the pain to go away. Patient reports multiple stressors however, reports trigger to overdose was due to his girlfriend lack of communication with him when she went out of town with a male friend for the [place]. Patient reports he took the pills and five minutes later he walked to his parent's house that live next door to him and had them call EMS. Patient stated, I don't really want to die. Patient also reports drinking about 10 beers that day. Patient other stressors include, financial issues, legal issues (two DUI) on parole, lost his license, relationship issues with 82 year old son and his son's mother and his current girlfriend. Patient reports feeling like  he let his son and family down. Patient reports everything started crashing down about six months ago.    Patient does not have outpatient services and does not have a history of inpatient treatment. Patient lives with his 64 year old son who has now moved in with his mother. Patient parents live next door to him. Patient denies having a firearm. Patient reports this is his first suicide attempt. Patient denies SI, HI, AVH and SIB. Patient is not psychotic, manic or delusional.     Total Time spent with patient: 30 minutes  Past Psychiatric History: Denies any previous psychiatric history.   Past Medical History:  Past Medical History:  Diagnosis Date   Allergy    Diabetes mellitus without complication (HCC)    High cholesterol    Hives of unknown origin    Hypertension     Past Surgical History:  Procedure Laterality Date   none     VASECTOMY     Family History:  Family History  Problem Relation Age of Onset   Diabetes Father    Diabetes Paternal Grandfather    Family Psychiatric  History: Denies Social History:  Social History   Substance and Sexual Activity  Alcohol Use Yes   Comment: Once a week.      Social History   Substance and Sexual Activity  Drug Use No    Social History   Socioeconomic History   Marital status: Married    Spouse name: Not on file   Number of children: Not on file   Years of education: Not on file   Highest education level: Not on  file  Occupational History   Not on file  Tobacco Use   Smoking status: Never   Smokeless tobacco: Never  Substance and Sexual Activity   Alcohol use: Yes    Comment: Once a week.    Drug use: No   Sexual activity: Never  Other Topics Concern   Not on file  Social History Narrative   Not on file   Social Determinants of Health   Financial Resource Strain: Not on file  Food Insecurity: Not on file  Transportation Needs: Not on file  Physical Activity: Not on file  Stress: Not on file   Social Connections: Not on file    Tobacco Cessation:  N/A, patient does not currently use tobacco products  Current Medications: Current Facility-Administered Medications  Medication Dose Route Frequency Provider Last Rate Last Admin   insulin aspart (novoLOG) injection 0-15 Units  0-15 Units Subcutaneous TID WC Ernie Avena, MD   3 Units at 09/06/21 0835   insulin aspart (novoLOG) injection 0-5 Units  0-5 Units Subcutaneous QHS Ernie Avena, MD       insulin detemir (LEVEMIR) injection 30 Units  30 Units Subcutaneous Daily Ernie Avena, MD   30 Units at 09/06/21 1038   Current Outpatient Medications  Medication Sig Dispense Refill   gabapentin (NEURONTIN) 100 MG capsule Take 100 mg by mouth daily.     glipiZIDE (GLUCOTROL XL) 2.5 MG 24 hr tablet Take 2.5 mg by mouth daily with breakfast.     insulin detemir (LEVEMIR) 100 unit/ml SOLN Inject 30 Units into the skin in the morning.     lisinopril (ZESTRIL) 2.5 MG tablet Take 2.5 mg by mouth daily. Reported on 02/13/2016     oxyCODONE-acetaminophen (PERCOCET/ROXICET) 5-325 MG tablet Take 1 tablet by mouth once.     pravastatin (PRAVACHOL) 40 MG tablet Take 40 mg by mouth daily.     Vitamin D, Ergocalciferol, (DRISDOL) 1.25 MG (50000 UNIT) CAPS capsule Take 50,000 Units by mouth every 7 (seven) days.     insulin glargine (LANTUS) 100 UNIT/ML Solostar Pen Inject 30 Units into the skin at bedtime. (Patient not taking: Reported on 09/05/2021) 15 mL 11   Wound Dressings (MEDIHONEY WOUND/BURN DRESSING) GEL Apply 1 g topically daily. 44 mL 2   PTA Medications: (Not in a hospital admission)   Musculoskeletal: Strength & Muscle Tone: within normal limits Gait & Station: normal Patient leans: N/A  Psychiatric Specialty Exam:  Presentation  General Appearance: Appropriate for Environment; Casual  Eye Contact:Fair  Speech:Clear and Coherent; Normal Rate  Speech Volume:Normal  Handedness:Right   Mood and Affect   Mood:Euthymic  Affect:Congruent; Appropriate   Thought Process  Thought Processes:Coherent; Linear  Descriptions of Associations:Intact  Orientation:Full (Time, Place and Person)  Thought Content:Logical  History of Schizophrenia/Schizoaffective disorder:No  Duration of Psychotic Symptoms:No data recorded Hallucinations:Hallucinations: None  Ideas of Reference:None  Suicidal Thoughts:Suicidal Thoughts: No  Homicidal Thoughts:Homicidal Thoughts: No   Sensorium  Memory:Immediate Fair; Recent Fair; Remote Fair  Judgment:Fair  Insight:Fair   Executive Functions  Concentration:Fair  Attention Span:Fair  Recall:Fair  Fund of Knowledge:Fair  Language:Fair   Psychomotor Activity  Psychomotor Activity:Psychomotor Activity: Normal   Assets  Assets:Financial Resources/Insurance; Desire for Improvement; Communication Skills; Physical Health; Resilience; Social Support   Sleep  Sleep:Sleep: Fair    Physical Exam: Physical Exam ROS Blood pressure (!) 164/91, pulse 90, temperature 99.8 F (37.7 C), temperature source Oral, resp. rate 15, SpO2 96 %. There is no height or weight on file to calculate BMI.  Demographic Factors:  Male, Divorced or widowed, Caucasian, and Low socioeconomic status  Loss Factors: Financial problems/change in socioeconomic status  Historical Factors: Impulsivity  Risk Reduction Factors:   Responsible for children under 49 years of age, Sense of responsibility to family, Religious beliefs about death, Employed, Living with another person, especially a relative, Positive social support, Positive therapeutic relationship, and Positive coping skills or problem solving skills  Continued Clinical Symptoms:  Depression:   Impulsivity Unstable or Poor Therapeutic Relationship  Cognitive Features That Contribute To Risk:  None    Suicide Risk:  Minimal: No identifiable suicidal ideation.  Patients presenting with no risk  factors but with morbid ruminations; may be classified as minimal risk based on the severity of the depressive symptoms    Plan Of Care/Follow-up recommendations:  Psych cleared. Safety planning conducted with patient and mother. Crisis information reviewed. No weapons or knives in the home. Patient appears motivated to seek treatment and future oriented to get help for situational stressors surrounding his most recent attempt.   Disposition: Psych cleared. See discharge instructions for further details. PHP scheduled.  Maryagnes Amosakia S Starkes-Perry, FNP 09/06/2021, 11:07 AM

## 2021-09-06 NOTE — Progress Notes (Addendum)
Inpatient Diabetes Program Recommendations  AACE/ADA: New Consensus Statement on Inpatient Glycemic Control (2015)  Target Ranges:  Prepandial:   less than 140 mg/dL      Peak postprandial:   less than 180 mg/dL (1-2 hours)      Critically ill patients:  140 - 180 mg/dL     Latest Reference Range & Units 09/05/21 16:44 09/05/21 22:12 09/06/21 07:54  Glucose-Capillary 70 - 99 mg/dL 233 (H) 175 (H) 193 (H)   Admit Suicide Attempt with Ingestion of 15 (100mg ) Zoloft tablets along with approximately 7 (5/325) Percocet tablets  History: DM2   Home DM Meds: Levemir 30 units daily    Glipizide 2.5 mg daily   Lab glucose 371 at 1am (AG 10/ CO2 level 22)  Given 1L NS in the ED   MD- Please consider:  1. Starting home Glipizide 2.5 mg Daily  Spoke with pt at bedside regarding A1c and importance of glucose control at home. Pt reports needing a new glucometer at d/c. He does not check glucose consistently and misses insulin doses 1-2 times a week. When drinking ETOH, he drinks beer, which is high in carbs. Discussed glucose and A1c goals. Pt reports still going to [place] [facility] for medical management. Encouraged glucose checks everyday and taking medication consistently.   --Will follow patient during hospitalization--  Tama Headings RN, MSN, BC-ADM Inpatient Diabetes Coordinator Team Pager [phone] (8a-5p)

## 2021-09-06 NOTE — BH Assessment (Signed)
[facility] Assessment Progress Note   Per Caryn Bee, NP, this voluntary pt does not require psychiatric hospitalization at this time.  Pt is psychiatrically cleared.  Pt would benefit from Partial Hospitalization Program offered by [facility], which is currently being offered virtually due to Covid-19.  This Clinical research associate has spoken to pt and he would like to enroll; he adds that he has necessary IT support to participate in virtual programming.  I have spoken to [facility], who has scheduled pt for intake on Monday, 09/11/2021  at 09:00.  This has been included in pt's discharge instructions.  EDP Derwood Kaplan, MD and pt's nurse, Garald Balding, have been notified.  Doylene Canning, MA Triage Specialist [phone]

## 2021-09-11 ENCOUNTER — Ambulatory Visit (HOSPITAL_COMMUNITY): Payer: Self-pay | Admitting: Professional

## 2021-09-11 DIAGNOSIS — F322 Major depressive disorder, single episode, severe without psychotic features: Secondary | ICD-10-CM | POA: Insufficient documentation

## 2021-09-12 NOTE — Psych (Signed)
Virtual Visit via Video Note  I connected with Dustin Baker on 09/11/21 at 11:00 AM EST by a video enabled telemedicine application and verified that I am speaking with the correct person using two identifiers.  Location: Patient: Home Provider: Clinical Home Office   I discussed the limitations of evaluation and management by telemedicine and the availability of in person appointments. The patient expressed understanding and agreed to proceed.  Follow Up Instructions:    I discussed the assessment and treatment plan with the patient. The patient was provided an opportunity to ask questions and all were answered. The patient agreed with the plan and demonstrated an understanding of the instructions.   The patient was advised to call back or seek an in-person evaluation if the symptoms worsen or if the condition fails to improve as anticipated.  I provided 60 minutes of non-face-to-face time during this encounter.   Dustin Baker, [name]     Comprehensive Clinical Assessment (CCA) Note  09/11/2021 Dustin Baker [id number]  Chief Complaint:  Chief Complaint  Patient presents with   Depression   Anxiety   Visit Diagnosis: MDD    CCA Screening, Triage and Referral (STR)  Patient Reported Information How did you hear about [place]? Hospital Discharge  Referral name: [facility]  Referral phone number: No data recorded  Whom do you see for routine medical problems? Primary Care  Practice/Facility Name: [facility]  Practice/Facility Phone Number: No data recorded Name of Contact: No data recorded Contact Number: No data recorded Contact Fax Number: No data recorded Prescriber Name: No data recorded Prescriber Address (if known): No data recorded  What Is the Reason for Your Visit/Call Today? depression, suicide attempt  How Long Has This Been Causing You Problems? 1-6 months  What Do You Feel Would Help You the Most Today? Treatment for Depression or other  mood problem   Have You Recently Been in Any Inpatient Treatment (Hospital/Detox/Crisis Center/28-Day Program)? No  Name/Location of Program/Hospital:No data recorded How Long Were You There? No data recorded When Were You Discharged? No data recorded  Have You Ever Received Services From [facility] Before? Yes  Who Do You See at [facility]? No data recorded  Have You Recently Had Any Thoughts About Hurting Yourself? No  Are You Planning to Commit Suicide/Harm Yourself At This time? No   Have you Recently Had Thoughts About [place]? No  Explanation: No data recorded  Have You Used Any Alcohol or Drugs in the Past 24 Hours? No  How Long Ago Did You Use Drugs or Alcohol? No data recorded What Did You Use and How Much? 10 beers   Do You Currently Have a Therapist/Psychiatrist? No  Name of Therapist/Psychiatrist: No data recorded  Have You Been Recently Discharged From Any Office Practice or Programs? No  Explanation of Discharge From Practice/Program: No data recorded    CCA Screening Triage Referral Assessment Type of Contact: Tele-Assessment  Is this Initial or Reassessment? Initial Assessment  Date Telepsych consult ordered in [facility]:  09/05/21  Time Telepsych consult ordered in [facility]:  No data recorded  Patient Reported Information Reviewed? No data recorded Patient Left Without Being Seen? No data recorded Reason for Not Completing Assessment: No data recorded  Collateral Involvement: notes   Does Patient Have a [place]? No data recorded Name and Contact of Legal Guardian: No data recorded If Minor and Not Living with Parent(s), Who has Custody? No data recorded Is CPS involved  or ever been involved? No data recorded Is APS involved or ever been involved? No data recorded  Patient Determined To Be At Risk for Harm To Self or Others Based on Review of Patient Reported Information or Presenting Complaint? Yes, for  Self-Harm  Method: No data recorded Availability of Means: No data recorded Intent: No data recorded Notification Required: No data recorded Additional Information for Danger to Others Potential: No data recorded Additional Comments for Danger to Others Potential: No data recorded Are There Guns or Other Weapons in Your Home? No data recorded Types of Guns/Weapons: No data recorded Are These Weapons Safely Secured?                            No data recorded Who Could Verify You Are Able To Have These Secured: No data recorded Do You Have any Outstanding Charges, Pending Court Dates, Parole/Probation? No data recorded Contacted To Inform of Risk of Harm To Self or Others: No data recorded  Location of Assessment: [facility]   Does Patient Present under Involuntary Commitment? No  IVC Papers Initial File Date: No data recorded  [place] of Residence: [place]   Patient Currently Receiving the Following Services: Not Receiving Services   Determination of Need: Urgent (48 hours)   Options For Referral: Partial Hospitalization     CCA Biopsychosocial Intake/Chief Complaint:  Pt referred to [facility] due to intentional overdose. Pt reports things have gone down hill since last year. Pt reports 2 DUIs (1 2 years ago, 1 1 year ago), pt lost license and is on parole. Pt reports he is stuck at home more than normal and that started depression since he is a social person. 2) Health: Pt reports absessed on stomach that took out of work for 1 week- was hospitalized for 4 days. Problem with foot. 3) Son and Ex: Pt reports ex has dated a guy for 8 years that was in and out of jail and treated son poorly. Son is not doing well in school. Restraining order against the guy now.  4) Financial: Has not worked in 61 month. 5) Work: Got laid off from work unexpectedly on a Friday (06-23-21). And girlfriend broke up with him on the following Monday. 6) Relationship: GF of 6 months broke up with  him, then they got back together, then she left him for another person on NYE. Pt reports he never felt this way about somebody and was hurt that she would not speak with him. Pt reports the pain was so bad that he attempted suicide by taking pills. I just wanted it all to go away. Pt reports he could only think about his son, went to parents house next door and they called 61 and took him to the hospital.  Supports: 68yo son, mom, dad, brother, aunt. Pt reports no treatment history for MH. Pt reports no hospitalizations. Pt denies any other attempts prior to overdose on 09/04/21.  Current Symptoms/Problems: recent suicide attempt by overdose; depression; racing thoughts; decreased sleep;   Patient Reported Schizophrenia/Schizoaffective Diagnosis in Past: No   Strengths: motivation for treatment  Preferences: to get better  Abilities: can attend and participate in treatment   Type of Services Patient Feels are Needed: PHP   Initial Clinical Notes/Concerns: No data recorded  Mental Health Symptoms Depression:   Irritability; Sleep (too much or little); Change in energy/activity; Hopelessness; Worthlessness; Difficulty Concentrating; Tearfulness   Duration of Depressive symptoms:  Greater than two weeks   Mania:   None   Anxiety:    Worrying; Tension; Sleep; Irritability; Difficulty concentrating   Psychosis:   None   Duration of Psychotic symptoms: No data recorded  Trauma:   None   Obsessions:   None   Compulsions:   None   Inattention:   None   Hyperactivity/Impulsivity:   None   Oppositional/Defiant Behaviors:   None   Emotional Irregularity:   None   Other Mood/Personality Symptoms:  No data recorded   Mental Status Exam Appearance and self-care  Stature:   Average   Weight:   Average weight   Clothing:   Casual   Grooming:   Normal   Cosmetic use:   None   Posture/gait:   Normal   Motor activity:   Not Remarkable   Sensorium   Attention:   Normal   Concentration:   Anxiety interferes   Orientation:   X5   Recall/memory:   Normal   Affect and Mood  Affect:   Depressed; Tearful   Mood:   Depressed   Relating  Eye contact:   Normal   Facial expression:   Responsive   Attitude toward examiner:   Cooperative   Thought and Language  Speech flow:  Clear and Coherent   Thought content:   Appropriate to Mood and Circumstances   Preoccupation:   None   Hallucinations:   None   Organization:  No data recorded  [employer] of Knowledge:   Good   Intelligence:   Average   Abstraction:   Normal   Judgement:   Poor   Reality Testing:   Adequate   Insight:   Fair   Decision Making:   Impulsive   Social Functioning  Social Maturity:   Responsible   Social Judgement:   Normal   Stress  Stressors:   Museum/gallery curator; Relationship; Grief/losses; Family conflict; Work; Transitions   Coping Ability:   Programme researcher, broadcasting/film/video Deficits:   None   Supports:   Family; Friends/Service system     Religion: Religion/Spirituality Are You A Religious Person?: Yes What is Your Religious Affiliation?: International aid/development worker: Leisure / Recreation Do You Have Hobbies?: Yes Leisure and Hobbies: Cornhole  Exercise/Diet: Exercise/Diet Do You Exercise?: No Have You Gained or Lost A Significant Amount of Weight in the Past Six Months?: No Do You Follow a Special Diet?: No Do You Have Any Trouble Sleeping?: Yes Explanation of Sleeping Difficulties: racing thoughts make it difficult to sleep   CCA Employment/Education Employment/Work Situation: Employment / Work Situation Employment Situation: Unemployed Work Stressors: none Patient's Job has Been Impacted by Current Illness: No Has Patient ever Been in Passenger transport manager?: No  Education: Education Did Teacher, adult education From [employer]?: Yes Did Physicist, medical?: Yes What Type of College Degree Do you Have?:  did not complete Did You Have An Individualized Education Program (IIEP): No Did You Have Any Difficulty At [employer]?: No Patient's Education Has Been Impacted by Current Illness: No   CCA Family/Childhood History Family and Relationship History: Family history Marital status: Divorced Divorced, when?: two divorce Are you sexually active?: No What is your sexual orientation?: heterosexual Does patient have children?: Yes How many children?: 1 How is patient's relationship with their children?: 28 yo; has son 50% of the time  Childhood History:  Childhood History By whom was/is the patient raised?: Both parents Additional childhood history information: "It was good. My family is amazing. We didn't  want for anything. We were well off." Description of patient's relationship with caregiver when they were a child: good with both Patient's description of current relationship with people who raised him/her: good with both- they live next door to each other Does patient have siblings?: Yes Number of Siblings: 1 Description of patient's current relationship with siblings: good Did patient suffer any verbal/emotional/physical/sexual abuse as a child?: No Did patient suffer from severe childhood neglect?: No Has patient ever been sexually abused/assaulted/raped as an adolescent or adult?: No Was the patient ever a victim of a crime or a disaster?: No Witnessed domestic violence?: No Has patient been affected by domestic violence as an adult?: No  Child/Adolescent Assessment:     CCA Substance Use Alcohol/Drug Use: Alcohol / Drug Use Pain Medications: See MAR Prescriptions: See MAR Over the Counter: See MAR History of alcohol / drug use?: Yes Negative Consequences of Use: Legal Substance #1 Name of Substance 1: ETOH 1 - Age of First Use: 22 1 - Amount (size/oz): varied- socially 1 - Frequency: socially (pt report 1-2 days a week; 5-6 beers over 4-5hours) 1 - Duration: since 22 1  - Last Use / Amount: 3 beers, yesterday 1 - Method of Aquiring: bought 1- Route of Use: oral                       ASAM's:  Six Dimensions of Multidimensional Assessment  Dimension 1:  Acute Intoxication and/or Withdrawal Potential:      Dimension 2:  Biomedical Conditions and Complications:      Dimension 3:  Emotional, Behavioral, or Cognitive Conditions and Complications:     Dimension 4:  Readiness to Change:     Dimension 5:  Relapse, Continued use, or Continued Problem Potential:     Dimension 6:  Recovery/Living Environment:     ASAM Severity Score:    ASAM Recommended Level of Treatment:     Substance use Disorder (SUD)    Recommendations for Services/Supports/Treatments: Recommendations for Services/Supports/Treatments Recommendations For Services/Supports/Treatments: Partial Hospitalization  DSM5 Diagnoses: Patient Active Problem List   Diagnosis Date Noted   Major depressive disorder, single episode, severe ([place]) 09/11/2021   Intentional overdose ([place]) 09/06/2021   Cellulitis 02/24/2021   Pulmonary nodule 02/23/2021   History of MRSA infection 02/23/2021   Cellulitis of trunk 02/23/2021   Anginal chest pain at rest [facility]) 12/01/2013   Type 2 diabetes mellitus ([place]) 12/01/2013   Essential hypertension 12/01/2013   Hyperlipidemia 12/01/2013    Patient Centered Plan: Patient is on the following Treatment Plan(s):  Depression   Referrals to Alternative Service(s): Referred to Alternative Service(s):   Place:   Date:   Time:    Referred to Alternative Service(s):   Place:   Date:   Time:    Referred to Alternative Service(s):   Place:   Date:   Time:    Referred to Alternative Service(s):   Place:   Date:   Time:     Dustin Baker, [facility]

## 2021-09-13 ENCOUNTER — Ambulatory Visit (INDEPENDENT_AMBULATORY_CARE_PROVIDER_SITE_OTHER): Payer: No Payment, Other | Admitting: Licensed Clinical Social Worker

## 2021-09-13 DIAGNOSIS — F322 Major depressive disorder, single episode, severe without psychotic features: Secondary | ICD-10-CM

## 2021-09-13 NOTE — Progress Notes (Signed)
Psychiatric Initial Adult Assessment   Patient Identification: NNAEMEKA SAMSON MRN:  [id number] Date of Evaluation:  09/13/2021 Referral Source: Emergency department Chief Complaint:    Worsening depression and anxiety after suicide attempt Visit Diagnosis:    ICD-10-CM   1. Major depressive disorder, single episode, severe (HCC)  F32.2       History of Present Illness:  Zacharey Jensen III is a 51 year old Caucasian male who presents with worsening depression and anxiety.  States he reached my breaking point about 2 weeks ago.  States he attempted overdose on medication and alcohol.  He reports multiple stressors to include 2 driving while intoxicated (DWI) and a recent break-up with a girlfriend of 6 months.  States he was recently laid off from his job.  Patient reports combination of stressors caused him to have suicidal ideations with overdose attempt.   Thijs he denied previous inpatient admissions.  Denied that he is followed by therapy and/or psychiatry.  Denied family history with mental illness.  Denied that he is prescribed daily medications for mood stabilization.   Noal reported chronic racing thoughts and inability to sleep well.  Discussed initiating trazodone 50 mg nightly patient was receptive to plan.  Patient to start partial hospitalization programming on 09/12/2021.   Associated Signs/Symptoms: Depression Symptoms:  depressed mood, difficulty concentrating, anxiety, (Hypo) Manic Symptoms:  Distractibility, Anxiety Symptoms:  Excessive Worry, Psychotic Symptoms:  Hallucinations: None PTSD Symptoms: NA  Past Psychiatric History:   Previous Psychotropic Medications: No   Substance Abuse History in the last 12 months:  No.  Consequences of Substance Abuse: NA  Past Medical History:  Past Medical History:  Diagnosis Date   Allergy    Diabetes mellitus without complication (HCC)    High cholesterol    Hives of unknown origin    Hypertension     Past Surgical  History:  Procedure Laterality Date   none     VASECTOMY      Family Psychiatric History:   Family History:  Family History  Problem Relation Age of Onset   Diabetes Father    Diabetes Paternal Grandfather     Social History:   Social History   Socioeconomic History   Marital status: Married    Spouse name: Not on file   Number of children: Not on file   Years of education: Not on file   Highest education level: Not on file  Occupational History   Not on file  Tobacco Use   Smoking status: Never   Smokeless tobacco: Never  Substance and Sexual Activity   Alcohol use: Yes    Comment: Once a week.    Drug use: No   Sexual activity: Never  Other Topics Concern   Not on file  Social History Narrative   Not on file   Social Determinants of Health   Financial Resource Strain: Not on file  Food Insecurity: Not on file  Transportation Needs: Not on file  Physical Activity: Not on file  Stress: Not on file  Social Connections: Not on file    Additional Social History:   Allergies:   Allergies  Allergen Reactions   Penicillins Other (See Comments)    Childhood reaction   Vancomycin Itching    Redness and itching after infusion - infuse as slower rate     Metabolic Disorder Labs: Lab Results  Component Value Date   HGBA1C 11.2 (H) 09/05/2021   MPG 274.74 09/05/2021   MPG 254.65 02/23/2021   No results  found for: PROLACTIN No results found for: CHOL, TRIG, HDL, CHOLHDL, VLDL, LDLCALC No results found for: TSH  Therapeutic Level Labs: No results found for: LITHIUM No results found for: CBMZ No results found for: VALPROATE  Current Medications: Current Outpatient Medications  Medication Sig Dispense Refill   gabapentin (NEURONTIN) 100 MG capsule Take 100 mg by mouth daily.     glipiZIDE (GLUCOTROL XL) 2.5 MG 24 hr tablet Take 2.5 mg by mouth daily with breakfast.     insulin detemir (LEVEMIR) 100 unit/ml SOLN Inject 30 Units into the skin in the  morning.     insulin glargine (LANTUS) 100 UNIT/ML Solostar Pen Inject 30 Units into the skin at bedtime. (Patient not taking: Reported on 09/05/2021) 15 mL 11   lisinopril (ZESTRIL) 2.5 MG tablet Take 2.5 mg by mouth daily. Reported on 02/13/2016     oxyCODONE-acetaminophen (PERCOCET/ROXICET) 5-325 MG tablet Take 1 tablet by mouth once.     pravastatin (PRAVACHOL) 40 MG tablet Take 40 mg by mouth daily.     Vitamin D, Ergocalciferol, (DRISDOL) 1.25 MG (50000 UNIT) CAPS capsule Take 50,000 Units by mouth every 7 (seven) days.     Wound Dressings (MEDIHONEY WOUND/BURN DRESSING) GEL Apply 1 g topically daily. 44 mL 2   No current facility-administered medications for this visit.    Musculoskeletal: Strength & Muscle Tone: within normal limits Gait & Station: normal Patient leans: N/A  Psychiatric Specialty Exam: Review of Systems  There were no vitals taken for this visit.There is no height or weight on file to calculate BMI.  General Appearance: Casual  Eye Contact:  Good  Speech:  Clear and Coherent  Volume:  Normal  Mood:  Anxious and Depressed  Affect:  Congruent  Thought Process:  Coherent  Orientation:  Full (Time, Place, and Person)  Thought Content:  Logical  Suicidal Thoughts:  No  Homicidal Thoughts:  No  Memory:  Immediate;   Good Recent;   Good  Judgement:  Good  Insight:  Good  Psychomotor Activity:  Normal  Concentration:  Concentration: Good  Recall:  Good  Fund of Knowledge:Good  Language: Good  Akathisia:  No  Handed:  Right  AIMS (if indicated):  done  Assets:  Communication Skills Desire for Improvement Resilience Social Support  ADL's:  Intact  Cognition: WNL  Sleep:  Good   Screenings: Insurance account manager from 09/11/2021 in [facility] Office Visit from 02/13/2016 in [facility] Nutrition from 05/03/2015 in [facility]  PHQ-2 Total Score 2 0 0  PHQ-9 Total Score 8 --  --      Flowsheet Row Counselor from 09/11/2021 in [facility] ED from 09/05/2021 in [facility] to Hosp-Admission (Discharged) from 02/22/2021 in [facility] 5 EAST MEDICAL UNIT  C-SSRS RISK CATEGORY High Risk High Risk No Risk       Assessment and Plan:  Patient to start partial hospitalization program Initiated trazodone 50 mg p.o. nightly as needed  Treatment plan was reviewed and agreed upon by NP T. Melvyn Neth and patient Amalia Hailey III need for group services     Oneta Rack, NP 1/11/202311:47 AM

## 2021-09-14 ENCOUNTER — Encounter (HOSPITAL_COMMUNITY): Payer: Self-pay

## 2021-09-14 ENCOUNTER — Ambulatory Visit (INDEPENDENT_AMBULATORY_CARE_PROVIDER_SITE_OTHER): Payer: No Payment, Other | Admitting: Licensed Clinical Social Worker

## 2021-09-14 ENCOUNTER — Encounter (HOSPITAL_COMMUNITY): Payer: Self-pay | Admitting: Professional

## 2021-09-14 DIAGNOSIS — F322 Major depressive disorder, single episode, severe without psychotic features: Secondary | ICD-10-CM

## 2021-09-14 NOTE — Progress Notes (Signed)
Spoke with patient via Webex video call, used 2 identifiers to correctly identify patient. States that groups are going well, this is his first time in [facility] as recommended after an inpatient stay with [facility]. He was inpatient for 2 days after taking a handful of his sons antidepressants and old pain pills. He went to his parents and called 911 for help. He had a bad break up with a girlfriend after New Years. She ghosted him and went to see an old boyfriend. He has trouble sleeping. He has 2 DUI's and lost his license. Also lost a good job and recently had to spend time in jail over the weekends for the DUI's. No longer feels suicidal. Denies SI/HI or AV hallucinations. On scale 1-10 as 10 being worst he rates depression at 5 and anxiety at 4. PHQ9=10. No side effects from medications. No issues or complaints.

## 2021-09-15 ENCOUNTER — Ambulatory Visit (INDEPENDENT_AMBULATORY_CARE_PROVIDER_SITE_OTHER): Payer: No Payment, Other | Admitting: Licensed Clinical Social Worker

## 2021-09-15 DIAGNOSIS — F322 Major depressive disorder, single episode, severe without psychotic features: Secondary | ICD-10-CM

## 2021-09-18 ENCOUNTER — Ambulatory Visit (INDEPENDENT_AMBULATORY_CARE_PROVIDER_SITE_OTHER): Payer: No Payment, Other | Admitting: Licensed Clinical Social Worker

## 2021-09-18 DIAGNOSIS — F322 Major depressive disorder, single episode, severe without psychotic features: Secondary | ICD-10-CM | POA: Diagnosis not present

## 2021-09-19 ENCOUNTER — Ambulatory Visit (INDEPENDENT_AMBULATORY_CARE_PROVIDER_SITE_OTHER): Payer: No Payment, Other | Admitting: Licensed Clinical Social Worker

## 2021-09-19 ENCOUNTER — Other Ambulatory Visit (HOSPITAL_COMMUNITY): Payer: Self-pay | Admitting: Family

## 2021-09-19 DIAGNOSIS — F322 Major depressive disorder, single episode, severe without psychotic features: Secondary | ICD-10-CM

## 2021-09-19 MED ORDER — TRAZODONE HCL 50 MG PO TABS: 50.0000 mg | ORAL_TABLET | Freq: Every day | ORAL | 0 refills | Status: DC

## 2021-09-19 NOTE — Progress Notes (Signed)
Spoke with patient via Webex video call, used 2 identifiers to correctly identify patient. States that groups are going good and he is enjoying them. Will start Trazodone tonight to help with sleep. Denies SI/HI or AV hallucinations. On scale 1-10 as 10 being worst rates depression at 2 and anxiety at 2. Denies any side effects from medication. No issues or complaints.

## 2021-09-20 ENCOUNTER — Ambulatory Visit (INDEPENDENT_AMBULATORY_CARE_PROVIDER_SITE_OTHER): Payer: No Payment, Other | Admitting: Licensed Clinical Social Worker

## 2021-09-20 DIAGNOSIS — F322 Major depressive disorder, single episode, severe without psychotic features: Secondary | ICD-10-CM | POA: Diagnosis not present

## 2021-09-21 ENCOUNTER — Ambulatory Visit (HOSPITAL_COMMUNITY): Payer: Self-pay

## 2021-09-22 ENCOUNTER — Ambulatory Visit (INDEPENDENT_AMBULATORY_CARE_PROVIDER_SITE_OTHER): Payer: No Payment, Other | Admitting: Licensed Clinical Social Worker

## 2021-09-22 DIAGNOSIS — F322 Major depressive disorder, single episode, severe without psychotic features: Secondary | ICD-10-CM | POA: Diagnosis not present

## 2021-09-22 NOTE — Progress Notes (Signed)
Virtual Visit via Video Note  I connected with Dustin Baker on 09/22/21 at  9:00 AM EST by a video enabled telemedicine application and verified that I am speaking with the correct person using two identifiers.  Location: Patient: Home Provider: Office   I discussed the limitations of evaluation and management by telemedicine and the availability of in person appointments. The patient expressed understanding and agreed to proceed.     I discussed the assessment and treatment plan with the patient. The patient was provided an opportunity to ask questions and all were answered. The patient agreed with the plan and demonstrated an understanding of the instructions.   The patient was advised to call back or seek an in-person evaluation if the symptoms worsen or if the condition fails to improve as anticipated.  I provided 15 minutes of non-face-to-face time during this encounter.   Dustin Rack, NP   [facility] MD/PA/NP OP Progress Note  09/22/2021 10:14 AM Dustin Baker  MRN:  [id number]  Chief Complaint: Dustin Baker reported " I just wrote a letter to my ex, so that I can get closer."   HPI:  Dustin Baker was seen and evaluated via WebEx.  He presents with a bright and pleasant affect.  Continues to report symptoms of worry however states he has been able to reduce his anxiety by using some distraction techniques.    States he recently drafted an email which he sent this morning to his ex.  He stated " I let her know my feelings I am hopeful this will provide closure for me."  He denies suicidal or homicidal ideations.  Denies auditory visual hallucinations.    Patient was recently started on trazodone due to restlessness, racing thoughts and sleepless nights.  States he has not been able to pick up medication as of yet.  Reports a good appetite.  NP to follow-up with patient for medication compliance/effectiveness.  Support, encouragement and  reassurance was provided.   Visit Diagnosis:     ICD-10-CM   1. Major depressive disorder, single episode, severe (HCC)  F32.2       Past Psychiatric History:   Past Medical History:  Past Medical History:  Diagnosis Date   Allergy    Diabetes mellitus without complication (HCC)    Diabetes mellitus, type II (HCC)    High cholesterol    Hives of unknown origin    Hypertension    Neuropathy     Past Surgical History:  Procedure Laterality Date   none     VASECTOMY      Family Psychiatric History:   Family History:  Family History  Problem Relation Age of Onset   Depression Mother    Diabetes Father    Diabetes Paternal Grandfather     Social History:  Social History   Socioeconomic History   Marital status: Divorced    Spouse name: Not on file   Number of children: 1   Years of education: Not on file   Highest education level: Some college, no degree  Occupational History   Not on file  Tobacco Use   Smoking status: Never   Smokeless tobacco: Never  Vaping Use   Vaping Use: Never used  Substance and Sexual Activity   Alcohol use: Yes    Comment: Once a week socially   Drug use: No   Sexual activity: Never  Other Topics Concern   Not on file  Social History Narrative   Not on file  Social Determinants of Health   Financial Resource Strain: Not on file  Food Insecurity: Not on file  Transportation Needs: Not on file  Physical Activity: Not on file  Stress: Not on file  Social Connections: Not on file    Allergies:  Allergies  Allergen Reactions   Penicillins Other (See Comments)    Childhood reaction   Vancomycin Itching    Redness and itching after infusion - infuse as slower rate     Metabolic Disorder Labs: Lab Results  Component Value Date   HGBA1C 11.2 (H) 09/05/2021   MPG 274.74 09/05/2021   MPG 254.65 02/23/2021   No results found for: PROLACTIN No results found for: CHOL, TRIG, HDL, CHOLHDL, VLDL, LDLCALC No results found for: TSH  Therapeutic Level Labs: No results  found for: LITHIUM No results found for: VALPROATE No components found for:  CBMZ  Current Medications: Current Outpatient Medications  Medication Sig Dispense Refill   gabapentin (NEURONTIN) 100 MG capsule Take 100 mg by mouth daily.     glipiZIDE (GLUCOTROL XL) 2.5 MG 24 hr tablet Take 2.5 mg by mouth daily with breakfast.     insulin detemir (LEVEMIR) 100 unit/ml SOLN Inject 30 Units into the skin in the morning.     insulin glargine (LANTUS) 100 UNIT/ML Solostar Pen Inject 30 Units into the skin at bedtime. (Patient not taking: Reported on 09/05/2021) 15 mL 11   lisinopril (ZESTRIL) 2.5 MG tablet Take 2.5 mg by mouth daily. Reported on 02/13/2016     oxyCODONE-acetaminophen (PERCOCET/ROXICET) 5-325 MG tablet Take 1 tablet by mouth once. (Patient not taking: Reported on 09/14/2021)     pravastatin (PRAVACHOL) 40 MG tablet Take 40 mg by mouth daily.     traZODone (DESYREL) 50 MG tablet Take 1 tablet (50 mg total) by mouth at bedtime. 30 tablet 0   Vitamin D, Ergocalciferol, (DRISDOL) 1.25 MG (50000 UNIT) CAPS capsule Take 50,000 Units by mouth every 7 (seven) days.     Wound Dressings (MEDIHONEY WOUND/BURN DRESSING) GEL Apply 1 g topically daily. (Patient not taking: Reported on 09/14/2021) 44 mL 2   No current facility-administered medications for this visit.     Musculoskeletal: Strength & Muscle Tone: within normal limits Gait & Station: normal Patient leans: N/A  Psychiatric Specialty Exam: Review of Systems  There were no vitals taken for this visit.There is no height or weight on file to calculate BMI.  General Appearance: Casual  Eye Contact:  Good  Speech:  Clear and Coherent  Volume:  Normal  Mood:  Anxious and Depressed  Affect:  Congruent  Thought Process:  Coherent  Orientation:  Full (Time, Place, and Person)  Thought Content: Logical   Suicidal Thoughts:  No  Homicidal Thoughts:  No  Memory:  Immediate;   Good Recent;   Good  Judgement:  Fair  Insight:  Good   Psychomotor Activity:  Normal  Concentration:  Concentration: Good  Recall:  Good  Fund of Knowledge: Good  Language: Good  Akathisia:  No  Handed:  Right  AIMS (if indicated): done  Assets:  Communication Skills  ADL's:  Intact  Cognition: WNL  Sleep:  Good   Screenings: PHQ2-9    Flowsheet [facility] Visit from 02/13/2016 in [facility] Nutrition from 05/03/2015 in [facility]  PHQ-2 Total Score 2 2 0 0  PHQ-9 Total Score 10  8 -- --      Advertising copywriter from 09/11/2021 in [facility] ED from 09/05/2021 in [facility] to Hosp-Admission (Discharged) from 02/22/2021 in [facility] 5 EAST MEDICAL UNIT  C-SSRS RISK CATEGORY High Risk High Risk No Risk        Assessment and Plan:  Continue partial hospitalization program in [facility] Continue medications as indicated ie trazodone 50 mg  po nightly PRN  Treatment plan was reviewed and agreed upon by NP T .Melvyn Neth and patient Joaquin Knebel Baker need for continued group services   Dustin Rack, NP 09/22/2021, 10:14 AM

## 2021-09-25 ENCOUNTER — Ambulatory Visit (HOSPITAL_COMMUNITY): Payer: Self-pay

## 2021-09-26 ENCOUNTER — Ambulatory Visit (INDEPENDENT_AMBULATORY_CARE_PROVIDER_SITE_OTHER): Payer: No Payment, Other | Admitting: Licensed Clinical Social Worker

## 2021-09-26 DIAGNOSIS — F322 Major depressive disorder, single episode, severe without psychotic features: Secondary | ICD-10-CM | POA: Diagnosis not present

## 2021-09-27 ENCOUNTER — Ambulatory Visit (INDEPENDENT_AMBULATORY_CARE_PROVIDER_SITE_OTHER): Payer: No Payment, Other | Admitting: Licensed Clinical Social Worker

## 2021-09-27 DIAGNOSIS — F322 Major depressive disorder, single episode, severe without psychotic features: Secondary | ICD-10-CM | POA: Diagnosis not present

## 2021-09-28 ENCOUNTER — Ambulatory Visit (INDEPENDENT_AMBULATORY_CARE_PROVIDER_SITE_OTHER): Payer: No Payment, Other | Admitting: Licensed Clinical Social Worker

## 2021-09-28 DIAGNOSIS — F322 Major depressive disorder, single episode, severe without psychotic features: Secondary | ICD-10-CM

## 2021-09-29 ENCOUNTER — Ambulatory Visit (INDEPENDENT_AMBULATORY_CARE_PROVIDER_SITE_OTHER): Payer: No Payment, Other | Admitting: Licensed Clinical Social Worker

## 2021-09-29 DIAGNOSIS — F322 Major depressive disorder, single episode, severe without psychotic features: Secondary | ICD-10-CM | POA: Diagnosis not present

## 2021-09-29 NOTE — Progress Notes (Signed)
Spoke with patient via Webex video call, used 2 identifiers to correctly identify patient. States that groups are good. This is his last week in [facility] and will start individual therapy March 4th. Trazodone has been working well the past couple of days, slept 7 hours last night. No wondering thoughts at night. On scale 1-10 as 10 being worst he rates depression at 2 and anxiety at 2. Denies SI/HI or AV hallucinations. No side effects from medications. No issues or complaints.

## 2021-10-03 ENCOUNTER — Encounter (HOSPITAL_COMMUNITY): Payer: Self-pay | Admitting: Professional

## 2021-10-03 MED ORDER — TRAZODONE HCL 50 MG PO TABS: 50.0000 mg | ORAL_TABLET | Freq: Every day | ORAL | 0 refills | Status: DC

## 2021-10-03 NOTE — Progress Notes (Signed)
Virtual Visit via Video Note  I connected with Dustin Baker on 10/03/21 at  9:00 AM EST by a video enabled telemedicine application and verified that I am speaking with the correct person using two identifiers.  Location: Patient: Home Provider: Office   I discussed the limitations of evaluation and management by telemedicine and the availability of in person appointments. The patient expressed understanding and agreed to proceed.    I discussed the assessment and treatment plan with the patient. The patient was provided an opportunity to ask questions and all were answered. The patient agreed with the plan and demonstrated an understanding of the instructions.   The patient was advised to call back or seek an in-person evaluation if the symptoms worsen or if the condition fails to improve as anticipated.  I provided 15 minutes of non-face-to-face time during this encounter.   Derrill Center, NP   [facility]  Dustin Baker [id number]  Admission date: 09/12/2021 Discharge date:09/29/2021  Reason for admission: Per admission assessment note: Dustin Baker is a 51 year old Caucasian male who presents with worsening depression and anxiety.  States he reached my breaking point about 2 weeks ago.  States he attempted overdose on medication and alcohol.  He reports multiple stressors to include 2 driving while intoxicated (DWI) and a recent break-up with a girlfriend of 6 months.  States he was recently laid off from his job.  Patient reports combination of stressors caused him to have suicidal ideations with overdose attempt.    Progress in Program Toward Treatment Goals: Ongoing, patient attended and participated with daily group session with active and engaged participation.  Denying suicidal or homicidal ideations at discharge.  We will make trazodone 50 mg available until outpatient follow-up  appointments.  Overall he reports his mood has stabilized and has plans to follow-up with outpatient providers.  States he will continue seeking group setting for additional support.  States his follow-up appointment is March 3 for therapy services.  Progress (rationale): Patient to keep all follow-up outpatient appointments.  Take all medications as prescribed. Keep all follow-up appointments as scheduled.  Do not consume alcohol or use illegal drugs while on prescription medications. Report any adverse effects from your medications to your primary care provider promptly.  In the event of recurrent symptoms or worsening symptoms, call 911, a crisis hotline, or go to the nearest emergency department for evaluation.     Ricky Ala NP 09/29/2021

## 2021-10-16 ENCOUNTER — Other Ambulatory Visit (HOSPITAL_COMMUNITY): Payer: Self-pay | Admitting: Family

## 2021-10-23 ENCOUNTER — Ambulatory Visit (HOSPITAL_COMMUNITY): Payer: No Payment, Other | Admitting: Student in an Organized Health Care Education/Training Program

## 2021-10-26 ENCOUNTER — Ambulatory Visit (HOSPITAL_COMMUNITY): Payer: Self-pay | Admitting: Psychiatry

## 2021-11-03 ENCOUNTER — Encounter (HOSPITAL_COMMUNITY): Payer: Self-pay

## 2021-11-03 ENCOUNTER — Telehealth (HOSPITAL_COMMUNITY): Payer: Self-pay | Admitting: Licensed Clinical Social Worker

## 2021-11-03 ENCOUNTER — Ambulatory Visit (HOSPITAL_COMMUNITY): Payer: No Payment, Other | Admitting: Licensed Clinical Social Worker

## 2021-11-03 NOTE — Telephone Encounter (Signed)
LCSW sent two links to pt phone with no response. F/u with PC and pt stated new job interfered with today's time. Pt rescheduled for April. Pt will be marked as left without being seen.  ?

## 2021-11-09 NOTE — Psych (Signed)
Virtual Visit via Video Note  I connected with Dustin Baker on 09/13/21 at  9:00 AM EST by a video enabled telemedicine application and verified that I am speaking with the correct person using two identifiers.  Location: Patient: patient home Provider: clinical home office   I discussed the limitations of evaluation and management by telemedicine and the availability of in person appointments. The patient expressed understanding and agreed to proceed.  I discussed the assessment and treatment plan with the patient. The patient was provided an opportunity to ask questions and all were answered. The patient agreed with the plan and demonstrated an understanding of the instructions.   The patient was advised to call back or seek an in-person evaluation if the symptoms worsen or if the condition fails to improve as anticipated.  Pt was provided 240 minutes of non-face-to-face time during this encounter.   Lorin Glass, LCSW   [facility] THERAPIST PROGRESS NOTE  Dustin Baker Baker [id number]  Session Time: 9:00 - 10:00  Participation Level: Active  Behavioral Response: CasualAlertDepressed  Type of Therapy: Group Therapy  Treatment Goals addressed: Coping  Progress Towards Goals: Initial  Interventions: CBT, DBT, Supportive, and Reframing  Summary: Clinician led check-in regarding current stressors and situation. Clinician utilized active listening and empathetic response and validated patient emotions. Clinician facilitated processing group on pertinent issues.   Therapist Response: Dustin Baker is a 51 y.o. male who presents with depression symptoms. Patient arrived within time allowed and reports that he is feeling "overwhelmed." Patient rates his mood at a 6 on a scale of 1-10 with 10 being great. Pt reports he is having racing thoughts and feeling stressed. Pt reports concerns regarding finances, his son, and filling his time. Pt reports poor sleep, approximately 4  hours in 2 hour breaks. Pt able to process. Pt engaged in discussion.         Session Time: 10:00 - 11:00   Participation Level: Active   Behavioral Response: CasualAlertDepressed   Type of Therapy: Group Therapy   Treatment Goals addressed: Coping   Interventions: CBT, DBT, Supportive and Reframing   Summary: Cln led discussion on saying "no." Group members shared struggles they have with saying no and how it affects them. Cln utilized boundaries, communication, and self-esteem tenets to inform disussion.     Therapist Response: Pt engaged in discussion and reports increased understanding of how to say "no."          Session Time: 11:00- 12:00   Participation Level: Active   Behavioral Response: CasualAlertDepressed   Type of Therapy: Group Therapy   Treatment Goals addressed: Coping   Interventions: Supportive and Reframing   Summary: Spiritual care group   Therapist Response: Pt engaged with chaplain on topic.         Session Time: 12:00 -1:00   Participation Level: Active   Behavioral Response: CasualAlertDepressed   Type of Therapy: Group therapy   Treatment Goals addressed: Coping   Interventions: CBT; Solution focused; Supportive; Reframing   Summary: 12:00 - 12:50: Cln continued topic of CBT cognitive distortions and utilized handout "cognitive distortions" to continue to discuss common examples of distorted thoughts and group members worked to identify examples in their own life.  12:50 -1:00 Clinician led check-out. Clinician assessed for immediate needs, medication compliance and efficacy, and safety concerns   Therapist Response: 12:00 - 12:50: Pt engaged in discussion and is able to make connections from his life to the distorted thoughts.  12:50 - 1:00: At check-out, patient rates his mood at a 6 on a scale of 1-10 with 10 being great. Pt reports afternoon plans of napping. Pt demonstrates some progress as evidenced by participating in first  group session. Patient denies SI/HI at the end of group.   Suicidal/Homicidal: Nowithout intent/plan  Plan: Pt will continue in PHP while working to decrease depression symptoms, improve sleep, and increase ability to manage symptoms in a healthy manner.  Collaboration of Care: Medication Management AEB TBobby Rumpf  Patient/Guardian was advised Release of Information must be obtained prior to any record release in order to collaborate their care with an outside provider. Patient/Guardian was advised if they have not already done so to contact the registration department to sign all necessary forms in order for Dustin Baker to release information regarding their care.   Consent: Patient/Guardian gives verbal consent for treatment and assignment of benefits for services provided during this visit. Patient/Guardian expressed understanding and agreed to proceed.   Diagnosis: Major depressive disorder, single episode, severe ([place]) [F32.2]    1. Major depressive disorder, single episode, severe ([place])       Lorin Glass, LCSW 11/09/2021

## 2021-11-09 NOTE — Psych (Signed)
Virtual Visit via Video Note  I connected with Dustin Baker on 09/14/21 at  9:00 AM EST by a video enabled telemedicine application and verified that I am speaking with the correct person using two identifiers.  Location: Patient: patient home Provider: clinical home office   I discussed the limitations of evaluation and management by telemedicine and the availability of in person appointments. The patient expressed understanding and agreed to proceed.  I discussed the assessment and treatment plan with the patient. The patient was provided an opportunity to ask questions and all were answered. The patient agreed with the plan and demonstrated an understanding of the instructions.   The patient was advised to call back or seek an in-person evaluation if the symptoms worsen or if the condition fails to improve as anticipated.  Pt was provided 240 minutes of non-face-to-face time during this encounter.   Lorin Glass, LCSW   [facility] [place] THERAPIST PROGRESS NOTE  Dustin Baker [id number]  Session Time: 9:00 - 10:00  Participation Level: Active  Behavioral Response: CasualAlertDepressed  Type of Therapy: Group Therapy  Treatment Goals addressed: Coping  Progress Towards Goals: Initial  Interventions: CBT, DBT, Supportive, and Reframing  Summary: Clinician led check-in regarding current stressors and situation. Clinician utilized active listening and empathetic response and validated patient emotions. Clinician facilitated processing group on pertinent issues.   Therapist Response: Dustin Baker is a 51 y.o. male who presents with depression symptoms. Patient arrived within time allowed and reports that he is feeling "tired." Patient rates his mood at a 5 on a scale of 1-10 with 10 being great. Pt reports having a "rough night" and sleeping worse than usual. Pt states struggling with ruminating thoughts re: a recent breakup and not being able to manage them. Pt states  afternoon was better than the evening and he spent time with his son. Pt able to process. Pt engaged in discussion.         Session Time: 10:00 - 11:00   Participation Level: Active   Behavioral Response: CasualAlertDepressed   Type of Therapy: Group Therapy   Treatment Goals addressed: Coping   Interventions: CBT, DBT, Supportive and Reframing   Summary: Cln introduced DBT distress tolerance distraction skills. Cln provides context for distraction skills and why distraction is a foundational way to manage mood dysregulation. Group discussed how to apply distraction.     Therapist Response: Pt engaged in discussion and reports understanding of how distraction can be applied to manage big feelings.          Session Time: 11:00- 12:00   Participation Level: Active   Behavioral Response: CasualAlertDepressed   Type of Therapy: Group Therapy   Treatment Goals addressed: Coping   Interventions: CBT, DBT, Supportive and Reframing   Summary: Cln continued topic of CBT cognitive distortions and introduced thought challenging as a way to  utilize the "challenge" C in C-C-C. Group utilized Web designer questions" as a way to introduce challenges and reframe distorted thinking. Group members worked through pt examples to practice challenging distorted thinking.    Therapist Response: Pt engaged in discussion and demonstrates understanding of challenging distorted thoughts through practice.         Session Time: 12:00 -1:00   Participation Level: Active   Behavioral Response: CasualAlertDepressed   Type of Therapy: Group therapy   Treatment Goals addressed: Coping   Interventions: CBT; Solution focused; Supportive; Reframing   Summary: 12:00 - 12:50: Cln introduced topic of positive  psychology. Group discussed the 5 ways to train your brain to scan for the positive: conscious acts of kindness, meditation, exercise, postive event journaling, and gratitudes. Group  members discussed how to apply the principles in their every day life.  12:50 -1:00 Clinician led check-out. Clinician assessed for immediate needs, medication compliance and efficacy, and safety concerns   Therapist Response: 12:00 - 12:50: Pt engaged in discussion and reports he can start by utilizing gratitudes.  12:50 - 1:00: At check-out, patient rates his mood at a 6 on a scale of 1-10 with 10 being great. Pt reports afternoon plans of watching sports. Pt demonstrates some progress as evidenced by attempting to fill his time positively. Patient denies SI/HI at the end of group.   Suicidal/Homicidal: Nowithout intent/plan  Plan: Pt will continue in PHP while working to decrease depression symptoms, improve sleep, and increase ability to manage symptoms in a healthy manner.  Collaboration of Care: Medication Management AEB TBobby Rumpf  Patient/Guardian was advised Release of Information must be obtained prior to any record release in order to collaborate their care with an outside provider. Patient/Guardian was advised if they have not already done so to contact the registration department to sign all necessary forms in order for [place] to release information regarding their care.   Consent: Patient/Guardian gives verbal consent for treatment and assignment of benefits for services provided during this visit. Patient/Guardian expressed understanding and agreed to proceed.   Diagnosis: Major depressive disorder, single episode, severe ([place]) [F32.2]    1. Major depressive disorder, single episode, severe ([place])       Lorin Glass, LCSW 11/09/2021

## 2021-12-10 ENCOUNTER — Observation Stay (HOSPITAL_COMMUNITY)
Admission: EM | Admit: 2021-12-10 | Discharge: 2021-12-11 | Disposition: A | Discharge status: Home / Self Care | Payer: BLUE CROSS/BLUE SHIELD | Attending: Otolaryngology | Admitting: Otolaryngology

## 2021-12-10 ENCOUNTER — Observation Stay (HOSPITAL_COMMUNITY): Payer: BLUE CROSS/BLUE SHIELD

## 2021-12-10 ENCOUNTER — Emergency Department (HOSPITAL_COMMUNITY): Payer: BLUE CROSS/BLUE SHIELD | Admitting: Certified Registered"

## 2021-12-10 ENCOUNTER — Encounter (HOSPITAL_COMMUNITY)
Admission: EM | Disposition: A | Discharge status: Home / Self Care | Payer: Self-pay | Source: Home / Self Care | Attending: Emergency Medicine

## 2021-12-10 ENCOUNTER — Other Ambulatory Visit: Payer: Self-pay

## 2021-12-10 ENCOUNTER — Emergency Department (HOSPITAL_COMMUNITY): Payer: BLUE CROSS/BLUE SHIELD

## 2021-12-10 ENCOUNTER — Encounter (HOSPITAL_COMMUNITY): Payer: Self-pay | Admitting: Emergency Medicine

## 2021-12-10 DIAGNOSIS — S0269XB Fracture of mandible of other specified site, initial encounter for open fracture: Secondary | ICD-10-CM | POA: Diagnosis not present

## 2021-12-10 DIAGNOSIS — S02609B Fracture of mandible, unspecified, initial encounter for open fracture: Secondary | ICD-10-CM | POA: Diagnosis not present

## 2021-12-10 DIAGNOSIS — S0990XA Unspecified injury of head, initial encounter: Secondary | ICD-10-CM | POA: Insufficient documentation

## 2021-12-10 DIAGNOSIS — Z23 Encounter for immunization: Secondary | ICD-10-CM | POA: Insufficient documentation

## 2021-12-10 DIAGNOSIS — R739 Hyperglycemia, unspecified: Secondary | ICD-10-CM

## 2021-12-10 DIAGNOSIS — S0993XA Unspecified injury of face, initial encounter: Secondary | ICD-10-CM | POA: Diagnosis present

## 2021-12-10 HISTORY — PX: ORIF MANDIBULAR FRACTURE: SHX2127

## 2021-12-10 HISTORY — DX: Gastro-esophageal reflux disease without esophagitis: K21.9

## 2021-12-10 LAB — CBC WITH DIFFERENTIAL/PLATELET
Abs Immature Granulocytes: 0.06 10*3/uL (ref 0.00–0.07)
Basophils Absolute: 0.1 10*3/uL (ref 0.0–0.1)
Basophils Relative: 1 %
Eosinophils Absolute: 0.3 10*3/uL (ref 0.0–0.5)
Eosinophils Relative: 3 %
HCT: 44.8 % (ref 39.0–52.0)
Hemoglobin: 15.3 g/dL (ref 13.0–17.0)
Immature Granulocytes: 1 %
Lymphocytes Relative: 31 %
Lymphs Abs: 2.6 10*3/uL (ref 0.7–4.0)
MCH: 30.1 pg (ref 26.0–34.0)
MCHC: 34.2 g/dL (ref 30.0–36.0)
MCV: 88 fL (ref 80.0–100.0)
Monocytes Absolute: 0.6 10*3/uL (ref 0.1–1.0)
Monocytes Relative: 8 %
Neutro Abs: 4.9 10*3/uL (ref 1.7–7.7)
Neutrophils Relative %: 56 %
Platelets: 224 10*3/uL (ref 150–400)
RBC: 5.09 MIL/uL (ref 4.22–5.81)
RDW: 11.8 % (ref 11.5–15.5)
WBC: 8.5 10*3/uL (ref 4.0–10.5)
nRBC: 0 % (ref 0.0–0.2)

## 2021-12-10 LAB — BASIC METABOLIC PANEL
Anion gap: 15 (ref 5–15)
Anion gap: 11 (ref 5–15)
BUN: 13 mg/dL (ref 6–20)
BUN: 8 mg/dL (ref 6–20)
CO2: 19 mmol/L — ABNORMAL LOW (ref 22–32)
CO2: 22 mmol/L (ref 22–32)
Calcium: 9 mg/dL (ref 8.9–10.3)
Calcium: 8.5 mg/dL — ABNORMAL LOW (ref 8.9–10.3)
Chloride: 94 mmol/L — ABNORMAL LOW (ref 98–111)
Chloride: 98 mmol/L (ref 98–111)
Creatinine, Ser: 0.74 mg/dL (ref 0.61–1.24)
Creatinine, Ser: 0.75 mg/dL (ref 0.61–1.24)
GFR, Estimated: >60 mL/min (ref >60)
GFR, Estimated: >60 mL/min (ref >60)
Glucose, Bld: 304 mg/dL — ABNORMAL HIGH (ref 70–99)
Glucose, Bld: 192 mg/dL — ABNORMAL HIGH (ref 70–99)
Potassium: 3.5 mmol/L (ref 3.5–5.1)
Potassium: 4.4 mmol/L (ref 3.5–5.1)
Sodium: 128 mmol/L — ABNORMAL LOW (ref 135–145)
Sodium: 131 mmol/L — ABNORMAL LOW (ref 135–145)

## 2021-12-10 LAB — GLUCOSE, CAPILLARY
Glucose-Capillary: 323 mg/dL — ABNORMAL HIGH (ref 70–99)
Glucose-Capillary: 287 mg/dL — ABNORMAL HIGH (ref 70–99)
Glucose-Capillary: 262 mg/dL — ABNORMAL HIGH (ref 70–99)
Glucose-Capillary: 245 mg/dL — ABNORMAL HIGH (ref 70–99)
Glucose-Capillary: 261 mg/dL — ABNORMAL HIGH (ref 70–99)
Glucose-Capillary: 184 mg/dL — ABNORMAL HIGH (ref 70–99)
Glucose-Capillary: 170 mg/dL — ABNORMAL HIGH (ref 70–99)
Glucose-Capillary: 239 mg/dL — ABNORMAL HIGH (ref 70–99)
Glucose-Capillary: 226 mg/dL — ABNORMAL HIGH (ref 70–99)
Glucose-Capillary: 233 mg/dL — ABNORMAL HIGH (ref 70–99)

## 2021-12-10 LAB — CBG MONITORING, ED: Glucose-Capillary: 300 mg/dL — ABNORMAL HIGH (ref 70–99)

## 2021-12-10 LAB — HEMOGLOBIN A1C
Hgb A1c MFr Bld: 11.5 % — ABNORMAL HIGH (ref 4.8–5.6)
Mean Plasma Glucose: 283.35 mg/dL

## 2021-12-10 SURGERY — OPEN REDUCTION INTERNAL FIXATION (ORIF) MANDIBULAR FRACTURE
Anesthesia: General | Site: Mouth

## 2021-12-10 MED ORDER — LIDOCAINE-EPINEPHRINE 1 %-1:100000 IJ SOLN
INTRAMUSCULAR | Status: AC
  Filled 2021-12-10: qty 1

## 2021-12-10 MED ORDER — OXYCODONE-ACETAMINOPHEN 5-325 MG PO TABS
1.0000 | ORAL_TABLET | ORAL | Status: DC | PRN
  Filled 2021-12-10: qty 2

## 2021-12-10 MED ORDER — INSULIN GLARGINE-YFGN 100 UNIT/ML ~~LOC~~ SOLN
30.0000 [IU] | Freq: Every day | SUBCUTANEOUS | Status: DC
  Filled 2021-12-10: qty 0.3

## 2021-12-10 MED ORDER — INSULIN ASPART 100 UNIT/ML IJ SOLN
0.0000 [IU] | Freq: Three times a day (TID) | INTRAMUSCULAR | Status: DC
  Administered 2021-12-10: 7 [IU] via SUBCUTANEOUS

## 2021-12-10 MED ORDER — THIAMINE HCL 100 MG/ML IJ SOLN
100.0000 mg | Freq: Every day | INTRAMUSCULAR | Status: DC
  Administered 2021-12-10: 100 mg via INTRAVENOUS
  Filled 2021-12-10: qty 2

## 2021-12-10 MED ORDER — CLINDAMYCIN PHOSPHATE 600 MG/50ML IV SOLN
600.0000 mg | Freq: Once | INTRAVENOUS | Status: AC
  Administered 2021-12-10: 600 mg via INTRAVENOUS
  Filled 2021-12-10: qty 50

## 2021-12-10 MED ORDER — FENTANYL CITRATE (PF) 250 MCG/5ML IJ SOLN
INTRAMUSCULAR | Status: DC | PRN
  Administered 2021-12-10: 100 ug via INTRAVENOUS
  Administered 2021-12-10 (×2): 50 ug via INTRAVENOUS

## 2021-12-10 MED ORDER — TETANUS-DIPHTH-ACELL PERTUSSIS 5-2.5-18.5 LF-MCG/0.5 IM SUSY
0.5000 mL | PREFILLED_SYRINGE | Freq: Once | INTRAMUSCULAR | Status: AC
  Administered 2021-12-10: 0.5 mL via INTRAMUSCULAR
  Filled 2021-12-10: qty 0.5

## 2021-12-10 MED ORDER — LEVOFLOXACIN IN D5W 750 MG/150ML IV SOLN
750.0000 mg | INTRAVENOUS | Status: DC
  Administered 2021-12-10: 750 mg via INTRAVENOUS
  Filled 2021-12-10: qty 150

## 2021-12-10 MED ORDER — FOLIC ACID 1 MG PO TABS: 1.0000 mg | ORAL_TABLET | Freq: Every day | ORAL | Status: DC

## 2021-12-10 MED ORDER — HYDROMORPHONE HCL 1 MG/ML IJ SOLN
1.0000 mg | Freq: Once | INTRAMUSCULAR | Status: AC
  Administered 2021-12-10: 1 mg via INTRAVENOUS
  Filled 2021-12-10: qty 1

## 2021-12-10 MED ORDER — PROPOFOL 10 MG/ML IV BOLUS
INTRAVENOUS | Status: AC
  Filled 2021-12-10: qty 20

## 2021-12-10 MED ORDER — ACETAMINOPHEN 500 MG PO TABS: 1000.0000 mg | ORAL_TABLET | Freq: Once | ORAL | Status: DC | PRN

## 2021-12-10 MED ORDER — CLINDAMYCIN PHOSPHATE 600 MG/50ML IV SOLN
INTRAVENOUS | Status: AC
  Filled 2021-12-10: qty 50

## 2021-12-10 MED ORDER — LORAZEPAM 2 MG/ML IJ SOLN: 1.0000 mg | INTRAMUSCULAR | Status: DC | PRN

## 2021-12-10 MED ORDER — PHENYLEPHRINE HCL-NACL 20-0.9 MG/250ML-% IV SOLN
INTRAVENOUS | Status: DC | PRN
  Administered 2021-12-10: 30 ug/min via INTRAVENOUS

## 2021-12-10 MED ORDER — LORAZEPAM 1 MG PO TABS: 1.0000 mg | ORAL_TABLET | ORAL | Status: DC | PRN

## 2021-12-10 MED ORDER — PHENYLEPHRINE 40 MCG/ML (10ML) SYRINGE FOR IV PUSH (FOR BLOOD PRESSURE SUPPORT)
PREFILLED_SYRINGE | INTRAVENOUS | Status: DC | PRN
  Administered 2021-12-10 (×3): 80 ug via INTRAVENOUS

## 2021-12-10 MED ORDER — LIDOCAINE 2% (20 MG/ML) 5 ML SYRINGE
INTRAMUSCULAR | Status: DC | PRN
  Administered 2021-12-10: 80 mg via INTRAVENOUS

## 2021-12-10 MED ORDER — DEXTROSE 50 % IV SOLN
0.0000 mL | INTRAVENOUS | Status: DC | PRN
  Filled 2021-12-10: qty 50

## 2021-12-10 MED ORDER — INSULIN ASPART 100 UNIT/ML IJ SOLN: 5.0000 [IU] | Freq: Three times a day (TID) | INTRAMUSCULAR | Status: DC

## 2021-12-10 MED ORDER — INSULIN GLARGINE-YFGN 100 UNIT/ML ~~LOC~~ SOLN
30.0000 [IU] | Freq: Every day | SUBCUTANEOUS | Status: DC
  Administered 2021-12-10: 30 [IU] via SUBCUTANEOUS
  Filled 2021-12-10 (×2): qty 0.3

## 2021-12-10 MED ORDER — FOLIC ACID 5 MG/ML IJ SOLN
1.0000 mg | Freq: Every day | INTRAMUSCULAR | Status: DC
Start: 2021-12-10 — End: 2021-12-11
  Administered 2021-12-10: 1 mg via INTRAVENOUS
  Filled 2021-12-10 (×2): qty 0.2

## 2021-12-10 MED ORDER — MIDAZOLAM HCL 2 MG/2ML IJ SOLN
INTRAMUSCULAR | Status: DC | PRN
  Administered 2021-12-10: 2 mg via INTRAVENOUS

## 2021-12-10 MED ORDER — FENTANYL CITRATE (PF) 100 MCG/2ML IJ SOLN: 25.0000 ug | INTRAMUSCULAR | Status: DC | PRN

## 2021-12-10 MED ORDER — SUGAMMADEX SODIUM 200 MG/2ML IV SOLN
INTRAVENOUS | Status: DC | PRN
  Administered 2021-12-10: 200 mg via INTRAVENOUS

## 2021-12-10 MED ORDER — INSULIN ASPART 100 UNIT/ML IJ SOLN
3.0000 [IU] | Freq: Once | INTRAMUSCULAR | Status: AC
  Administered 2021-12-10: 3 [IU] via SUBCUTANEOUS
  Filled 2021-12-10: qty 0.03

## 2021-12-10 MED ORDER — FENTANYL CITRATE (PF) 250 MCG/5ML IJ SOLN
INTRAMUSCULAR | Status: AC
  Filled 2021-12-10: qty 5

## 2021-12-10 MED ORDER — MORPHINE SULFATE (PF) 4 MG/ML IV SOLN
4.0000 mg | Freq: Once | INTRAVENOUS | Status: AC
  Administered 2021-12-10: 4 mg via INTRAVENOUS
  Filled 2021-12-10: qty 1

## 2021-12-10 MED ORDER — OXYCODONE HCL 5 MG/5ML PO SOLN: 5.0000 mg | Freq: Once | ORAL | Status: DC | PRN

## 2021-12-10 MED ORDER — SODIUM CHLORIDE 0.9 % IV BOLUS
1000.0000 mL | Freq: Once | INTRAVENOUS | Status: AC
  Administered 2021-12-10: 1000 mL via INTRAVENOUS

## 2021-12-10 MED ORDER — ROCURONIUM BROMIDE 10 MG/ML (PF) SYRINGE
PREFILLED_SYRINGE | INTRAVENOUS | Status: DC | PRN
Start: 2021-12-10 — End: 2021-12-10
  Administered 2021-12-10: 50 mg via INTRAVENOUS

## 2021-12-10 MED ORDER — ONDANSETRON 4 MG PO TBDP: 4.0000 mg | ORAL_TABLET | Freq: Four times a day (QID) | ORAL | Status: DC | PRN

## 2021-12-10 MED ORDER — HYDROMORPHONE HCL 1 MG/ML IJ SOLN
INTRAMUSCULAR | Status: DC | PRN
  Administered 2021-12-10: .5 mg via INTRAVENOUS

## 2021-12-10 MED ORDER — ACETAMINOPHEN 160 MG/5ML PO SOLN: 1000.0000 mg | Freq: Once | ORAL | Status: DC | PRN

## 2021-12-10 MED ORDER — ONDANSETRON HCL 4 MG/2ML IJ SOLN: 4.0000 mg | Freq: Four times a day (QID) | INTRAMUSCULAR | Status: DC | PRN

## 2021-12-10 MED ORDER — INSULIN REGULAR(HUMAN) IN NACL 100-0.9 UT/100ML-% IV SOLN
INTRAVENOUS | Status: DC
  Administered 2021-12-10: 19 [IU]/h via INTRAVENOUS
  Filled 2021-12-10 (×3): qty 100

## 2021-12-10 MED ORDER — HYDROMORPHONE HCL 1 MG/ML IJ SOLN
1.0000 mg | INTRAMUSCULAR | Status: DC | PRN
  Administered 2021-12-10 – 2021-12-11 (×3): 1 mg via INTRAVENOUS
  Filled 2021-12-10 (×3): qty 1

## 2021-12-10 MED ORDER — OXYCODONE HCL 5 MG PO TABS: 5.0000 mg | ORAL_TABLET | Freq: Once | ORAL | Status: DC | PRN

## 2021-12-10 MED ORDER — OXYMETAZOLINE HCL 0.05 % NA SOLN
NASAL | Status: AC
  Filled 2021-12-10: qty 30

## 2021-12-10 MED ORDER — LACTATED RINGERS IV SOLN
INTRAVENOUS | Status: DC
Start: 2021-12-10 — End: 2021-12-10

## 2021-12-10 MED ORDER — THIAMINE HCL 100 MG PO TABS: 100.0000 mg | ORAL_TABLET | Freq: Every day | ORAL | Status: DC

## 2021-12-10 MED ORDER — HYDRALAZINE HCL 20 MG/ML IJ SOLN: 5.0000 mg | Freq: Four times a day (QID) | INTRAMUSCULAR | Status: DC | PRN

## 2021-12-10 MED ORDER — HYDROMORPHONE HCL 1 MG/ML IJ SOLN
INTRAMUSCULAR | Status: AC
  Filled 2021-12-10: qty 0.5

## 2021-12-10 MED ORDER — ONDANSETRON HCL 4 MG/2ML IJ SOLN
INTRAMUSCULAR | Status: DC | PRN
  Administered 2021-12-10: 4 mg via INTRAVENOUS

## 2021-12-10 MED ORDER — PROPOFOL 10 MG/ML IV BOLUS
INTRAVENOUS | Status: DC | PRN
  Administered 2021-12-10: 140 mg via INTRAVENOUS

## 2021-12-10 MED ORDER — OXYMETAZOLINE HCL 0.05 % NA SOLN
NASAL | Status: DC | PRN
  Administered 2021-12-10: 10 via TOPICAL

## 2021-12-10 MED ORDER — METRONIDAZOLE 500 MG/100ML IV SOLN
500.0000 mg | Freq: Two times a day (BID) | INTRAVENOUS | Status: DC
  Administered 2021-12-10 – 2021-12-11 (×2): 500 mg via INTRAVENOUS
  Filled 2021-12-10 (×2): qty 100

## 2021-12-10 MED ORDER — INSULIN ASPART 100 UNIT/ML IJ SOLN
0.0000 [IU] | Freq: Every day | INTRAMUSCULAR | Status: DC
  Administered 2021-12-10: 2 [IU] via SUBCUTANEOUS

## 2021-12-10 MED ORDER — SUCCINYLCHOLINE CHLORIDE 200 MG/10ML IV SOSY
PREFILLED_SYRINGE | INTRAVENOUS | Status: DC | PRN
  Administered 2021-12-10: 160 mg via INTRAVENOUS

## 2021-12-10 MED ORDER — LACTATED RINGERS IV SOLN: INTRAVENOUS | Status: DC | PRN

## 2021-12-10 MED ORDER — ADULT MULTIVITAMIN W/MINERALS CH: 1.0000 | ORAL_TABLET | Freq: Every day | ORAL | Status: DC

## 2021-12-10 MED ORDER — ACETAMINOPHEN 10 MG/ML IV SOLN: 1000.0000 mg | Freq: Once | INTRAVENOUS | Status: DC | PRN

## 2021-12-10 MED ORDER — MIDAZOLAM HCL 2 MG/2ML IJ SOLN
INTRAMUSCULAR | Status: AC
  Filled 2021-12-10: qty 2

## 2021-12-10 MED ORDER — LIDOCAINE-EPINEPHRINE 1 %-1:100000 IJ SOLN
INTRAMUSCULAR | Status: DC | PRN
  Administered 2021-12-10: 5 mL

## 2021-12-10 SURGICAL SUPPLY — 59 items
BAG COUNTER SPONGE SURGICOUNT (BAG) ×2 IMPLANT
BAG SPNG CNTER NS LX DISP (BAG) ×1
BAR ARCH PREFORM MXLMNDB FX (Miscellaneous) ×2 IMPLANT
BAR FIX PREFORMED OMNIMAX (Miscellaneous) ×2 IMPLANT
BIT DRILL 1.6X115 (BIT) ×1
BIT DRILL 1.6X115MM (BIT) IMPLANT
BLADE MANDIBULAR SCREWDRIVER (BLADE) ×1 IMPLANT
BLADE SURG 10 STRL SS (BLADE) IMPLANT
BLADE SURG 15 STRL LF DISP TIS (BLADE) IMPLANT
BLADE SURG 15 STRL SS (BLADE)
CANISTER SUCT 3000ML PPV (MISCELLANEOUS) ×2 IMPLANT
CLEANER TIP ELECTROSURG 2X2 (MISCELLANEOUS) ×2 IMPLANT
COVER SURGICAL LIGHT HANDLE (MISCELLANEOUS) ×4 IMPLANT
DECANTER SPIKE VIAL GLASS SM (MISCELLANEOUS) ×2 IMPLANT
DRAPE HALF SHEET 40X57 (DRAPES) IMPLANT
DRILL BIT 1.6X115MM (BIT) ×2
ELECT COATED BLADE 2.86 ST (ELECTRODE) IMPLANT
ELECT NDL BLADE 2-5/6 (NEEDLE) IMPLANT
ELECT NEEDLE BLADE 2-5/6 (NEEDLE) IMPLANT
ELECT REM PT RETURN 9FT ADLT (ELECTROSURGICAL) ×2
ELECTRODE REM PT RTRN 9FT ADLT (ELECTROSURGICAL) ×1 IMPLANT
GLOVE SURG LTX SZ7.5 (GLOVE) ×2 IMPLANT
GOWN STRL REUS W/ TWL LRG LVL3 (GOWN DISPOSABLE) ×2 IMPLANT
GOWN STRL REUS W/TWL LRG LVL3 (GOWN DISPOSABLE) ×4
KIT BASIN OR (CUSTOM PROCEDURE TRAY) ×2 IMPLANT
KIT TURNOVER KIT B (KITS) ×2 IMPLANT
LIGASURE WIRE 22GA (WIRE) ×1 IMPLANT
NDL HYPO 25GX1X1/2 BEV (NEEDLE) IMPLANT
NEEDLE HYPO 25GX1X1/2 BEV (NEEDLE) IMPLANT
NS IRRIG 1000ML POUR BTL (IV SOLUTION) ×2 IMPLANT
PAD ARMBOARD 7.5X6 YLW CONV (MISCELLANEOUS) ×4 IMPLANT
PATTIES SURGICAL .5 X3 (DISPOSABLE) IMPLANT
PENCIL FOOT CONTROL (ELECTRODE) ×2 IMPLANT
PLATE 4HOLE STR 1.6MM (Plate) ×1 IMPLANT
PLATE LOCK 4H STRT 1.6 GOLD (Plate) ×1 IMPLANT
PLATE LOCK STRT 4H 1/SCREW (Plate) ×1 IMPLANT
POSITIONER HEAD DONUT 9IN (MISCELLANEOUS) IMPLANT
PROTECTOR CORNEAL (OPHTHALMIC RELATED) IMPLANT
SCISSORS WIRE ANG 4 3/4 DISP (INSTRUMENTS) IMPLANT
SCREW BONE 2X7 CROSS DRIVE (Screw) ×10 IMPLANT
SCREW BONE MANDIB SD 2X11 (Screw) ×2 IMPLANT
SCREW LOCKING X-DR 2.0X10 (Screw) ×1 IMPLANT
SCREW LOCKING X-DR 2.0X12 (Screw) ×2 IMPLANT
SCREW LOCKING X-DR 2.0X14 (Screw) ×1 IMPLANT
SCREW LOCKING X-DR 2.0X8 (Screw) ×1 IMPLANT
SCREW NLOCK LORENZ 2X5 (Screw) ×4 IMPLANT
SCREW SD IMF HEX 2.0X9 (Screw) ×1 IMPLANT
SUT CHROMIC 3 0 PS 2 (SUTURE) ×2 IMPLANT
SUT CHROMIC 4 0 PS 2 18 (SUTURE) ×2 IMPLANT
SUT ETHILON 5 0 P 3 18 (SUTURE) ×2
SUT NYLON ETHILON 5-0 P-3 1X18 (SUTURE) ×1 IMPLANT
SUT SILK 2 0 PERMA HAND 18 BK (SUTURE) IMPLANT
SUT STEEL 0 (SUTURE)
SUT STEEL 0 18XMFL TIE 17 (SUTURE) IMPLANT
SUT STEEL 4 (SUTURE) ×2 IMPLANT
TOWEL GREEN STERILE FF (TOWEL DISPOSABLE) ×2 IMPLANT
TRAY ENT MC OR (CUSTOM PROCEDURE TRAY) ×2 IMPLANT
WATER STERILE IRR 1000ML POUR (IV SOLUTION) ×2 IMPLANT
WIRE 24 GAUGE OMINIMAX MMF (WIRE) ×1 IMPLANT

## 2021-12-10 NOTE — Anesthesia Postprocedure Evaluation (Signed)
Anesthesia Post Note ? ?Patient: Dustin Baker ? ?Procedure(s) Performed: OPEN REDUCTION INTERNAL FIXATION (ORIF) MANDIBULAR FRACTURE (Mouth) ? ?  ? ?Patient location during evaluation: PACU ?Anesthesia Type: General ?Level of consciousness: patient cooperative and awake ?Pain management: pain level controlled ?Vital Signs Assessment: post-procedure vital signs reviewed and stable ?Respiratory status: spontaneous breathing, nonlabored ventilation and respiratory function stable ?Cardiovascular status: blood pressure returned to baseline and stable ?Postop Assessment: no apparent nausea or vomiting ?Anesthetic complications: no ? ? ?No notable events documented. ? ?Last Vitals:  ?Vitals:  ? 12/10/21 1355 12/10/21 1410  ?BP: (!) 145/92 (!) 161/89  ?Pulse: 99 (!) 101  ?Resp: 14 14  ?Temp:    ?SpO2: 99% 100%  ?  ?Last Pain:  ?Vitals:  ? 12/10/21 1355  ?TempSrc:   ?PainSc: Asleep  ? ? ?  ?  ?  ?  ?  ?  ? ?[name] ? ? ? ? ?
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
## 2021-12-10 NOTE — ED Notes (Signed)
[facility] at bedside. Requesting additional pain medication, EDP [place] aware.  ?

## 2021-12-10 NOTE — ED Notes (Signed)
[facility] called for transport to [facility] ED. Patient to be picked up after shift change at 0700. ?
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
## 2021-12-10 NOTE — Transfer of Care (Signed)
Immediate Anesthesia Transfer of Care Note ? ?Patient: KAHNER YANIK III ? ?Procedure(s) Performed: OPEN REDUCTION INTERNAL FIXATION (ORIF) MANDIBULAR FRACTURE (Mouth) ? ?Patient Location: PACU ? ?Anesthesia Type:General ? ?Level of Consciousness: awake, alert  and oriented ? ?Airway & Oxygen Therapy: Patient Spontanous Breathing ? ?Post-op Assessment: Report given to RN and Post -op Vital signs reviewed and stable ? ?Post vital signs: Reviewed and stable ? ?Last Vitals:  ?Vitals Value Taken Time  ?BP 149/91 12/10/21 1155  ?Temp 36.1 ?C 12/10/21 1155  ?Pulse 97 12/10/21 1205  ?Resp 14 12/10/21 1205  ?SpO2 99 % 12/10/21 1205  ?Vitals shown include unvalidated device data. ? ?Last Pain:  ?Vitals:  ? 12/10/21 1155  ?TempSrc:   ?PainSc: Asleep  ?   ? ?  ? ?Complications: No notable events documented. ?
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
## 2021-12-10 NOTE — Anesthesia Procedure Notes (Signed)
Procedure Name: Intubation ?Date/Time: 12/10/2021 10:00 AM ?Performed by: Macie Burows, CRNA ?Pre-anesthesia Checklist: Patient identified, Emergency Drugs available, Suction available and Patient being monitored ?Patient Re-evaluated:Patient Re-evaluated prior to induction ?Oxygen Delivery Method: Circle system utilized ?Preoxygenation: Pre-oxygenation with 100% oxygen ?Induction Type: IV induction ?Laryngoscope Size: Glidescope and 4 ?Grade View: Grade I ?Tube type: Oral ?Nasal Tubes: Right and Nasal prep performed ?Tube size: 7.5 mm ?Number of attempts: 1 ?Airway Equipment and Method: Video-laryngoscopy and Oral airway ?Placement Confirmation: ETT inserted through vocal cords under direct vision, positive ETCO2 and breath sounds checked- equal and bilateral ?Secured at: 21 cm ?Tube secured with: Tape ?Dental Injury: Teeth and Oropharynx as per pre-operative assessment  ? ? ? ? ?

## 2021-12-10 NOTE — Progress Notes (Signed)
Notified Dr. Maple Hudson of patient's blood sugar 323. Orders to start endo tool. ?Notified pharmacy. Clerance Lav, crna stated she would start endo tool when patient is back in [facility].  Insulin bag given to [place]. ?
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
## 2021-12-10 NOTE — Anesthesia Preprocedure Evaluation (Addendum)
Anesthesia Evaluation  ?Patient identified by MRN, date of birth, ID band ?Patient awake ? ? ? ?Reviewed: ?Allergy & Precautions, NPO status , Patient's Chart, lab work & pertinent test results ? ?History of Anesthesia Complications ?Negative for: history of anesthetic complications ? ?Airway ?Mallampati: IV ? ?TM Distance: >3 FB ?Neck ROM: Full ? ?Mouth opening: Limited Mouth Opening ? Dental ? ?(+) Dental Advisory Given ?Right jaw deformity:   ?Pulmonary ?neg pulmonary ROS,  ?  ?Pulmonary exam normal ? ? ? ? ? ? ? Cardiovascular ?hypertension, Pt. on medications ? ?Rhythm:Regular  ? ?  ?Neuro/Psych ?negative neurological ROS ?   ? GI/Hepatic ?GERD  ,  ?Endo/Other  ?diabetes, Insulin DependentLab Results ?     Component                Value               Date                 ?     HGBA1C                   11.2 (H)            09/05/2021           ? ? Renal/GU ?Lab Results ?     Component                Value               Date                 ?     CREATININE               0.74                12/10/2021           ?Lab Results ?     Component                Value               Date                 ?     K                        3.5                 12/10/2021           ?  ? ?  ?Musculoskeletal ? ? Abdominal ?  ?Peds ? Hematology ?Lab Results ?     Component                Value               Date                 ?     WBC                      8.5                 12/10/2021           ?     HGB                      15.3  12/10/2021           ?     HCT                      44.8                12/10/2021           ?     MCV                      88.0                12/10/2021           ?     PLT                      224                 12/10/2021           ?   ?Anesthesia Other Findings ? ? Reproductive/Obstetrics ? ?  ? ? ? ? ? ? ? ? ? ? ? ? ? ?  ?  ? ? ? ? ? ? ? ?Anesthesia Physical ?Anesthesia Plan ? ?ASA: 3 ? ?Anesthesia Plan: General  ? ?Post-op Pain Management: Ofirmev  IV (intra-op)* and Toradol IV (intra-op)*  ? ?Induction: Intravenous, Rapid sequence and Cricoid pressure planned ? ?PONV Risk Score and Plan: 2 and Ondansetron and Dexamethasone ? ?Airway Management Planned: Nasal ETT ? ?Additional Equipment: None ? ?Intra-op Plan:  ? ?Post-operative Plan: Extubation in OR ? ?Informed Consent:  ? ?Plan Discussed with: CRNA ? ?Anesthesia Plan Comments:   ? ? ? ? ? ? ?Anesthesia Quick Evaluation ? ?

## 2021-12-11 LAB — BASIC METABOLIC PANEL
Anion gap: 8 (ref 5–15)
BUN: 5 mg/dL — ABNORMAL LOW (ref 6–20)
CO2: 25 mmol/L (ref 22–32)
Calcium: 8.5 mg/dL — ABNORMAL LOW (ref 8.9–10.3)
Chloride: 99 mmol/L (ref 98–111)
Creatinine, Ser: 0.75 mg/dL (ref 0.61–1.24)
GFR, Estimated: >60 mL/min (ref >60)
Glucose, Bld: 174 mg/dL — ABNORMAL HIGH (ref 70–99)
Potassium: 3.7 mmol/L (ref 3.5–5.1)
Sodium: 132 mmol/L — ABNORMAL LOW (ref 135–145)

## 2021-12-11 MED ORDER — CLINDAMYCIN HCL 300 MG PO CAPS: 300.0000 mg | ORAL_CAPSULE | Freq: Three times a day (TID) | ORAL | 0 refills | Status: AC

## 2021-12-11 MED ORDER — HYDROCODONE-ACETAMINOPHEN 7.5-325 MG/15ML PO SOLN: 15.0000 mL | Freq: Four times a day (QID) | ORAL | 0 refills | Status: DC | PRN

## 2021-12-11 MED ORDER — LISINOPRIL 10 MG PO TABS: 10.0000 mg | ORAL_TABLET | Freq: Every day | ORAL | Status: DC

## 2021-12-11 NOTE — Social Work (Signed)
CSW spoke to pt at bedside about substance use. Pt declined to comment. Pt declined resources. ? ?Emeterio Reeve, LCSW ?Clinical Social Worker ? ?

## 2021-12-11 NOTE — Discharge Summary (Signed)
Physician Discharge Summary  ?Patient ID: ?Dustin Baker ?MRN: [id number] ?DOB/AGE: 1970-09-04 51 y.o. ? ?Admit date: 12/10/2021 ?Discharge date: 12/11/2021 ? ?Admission Diagnoses: ? ?Discharge Diagnoses:  ?Principal Problem: ?  Mandible open fracture (HCC) ?Active Problems: ?  Hyperglycemia ? ? ?Discharged Condition: good ? ?Hospital Course: Patient was admitted for open reduction internal fixation of mandible fracture.  The underwent the procedure.  His Panorex postoperatively showed a small step-off of the parasymphyseal fracture and also at the angle.  I discussed this with Dr. Ross Marcus, oral surgery, about whether this needed a plate for the angle.  He felt like this was certainly going to heal if kept in wires.  I discussed this with the patient and he decided that we will just leave it be and not have a second operation.  He is doing well currently he is postop day 1.  He has tight wires and feels like his occlusion is correct.  He is discharged to home to follow-up in 2 weeks sooner if he has any issues ? ?Consults: None ? ?Significant Diagnostic Studies:  ?Treatments:  ? ?Discharge Exam: ?Blood pressure (!) 137/91, pulse 97, temperature 98.2 ?F (36.8 ?C), temperature source Oral, resp. rate 18, height 5' 10.5" (1.791 m), weight 106.6 kg, SpO2 98 %. ?He is awake and alert.  His mouth is still a bit swollen.  His occlusion looks excellent.  Wires are all tight.  Neck is without swelling or masses.  Heart is regular.  Lungs are clear.  Extremities are without swelling or pain. ? ?Disposition: Discharge disposition: 01-Home or Self Care ? ? ? ? ? ? ?Discharge Instructions   ? ? Call MD for:  difficulty breathing, headache or visual disturbances   Complete by: As directed ?  ? Call MD for:  extreme fatigue   Complete by: As directed ?  ? Call MD for:  hives   Complete by: As directed ?  ? Call MD for:  persistant dizziness or light-headedness   Complete by: As directed ?  ? Call MD for:  persistant nausea and  vomiting   Complete by: As directed ?  ? Call MD for:  redness, tenderness, or signs of infection (pain, swelling, redness, odor or green/yellow discharge around incision site)   Complete by: As directed ?  ? Call MD for:  severe uncontrolled pain   Complete by: As directed ?  ? Call MD for:  temperature >100.4   Complete by: As directed ?  ? Diet - low sodium heart healthy   Complete by: As directed ?  ? Discharge instructions   Complete by: As directed ?  ? Keep wire cutters with you at all times.  If the wires seem to be loose and you can move your teeth you need to call me.  Take the antibiotic until completed.  Use Motrin and Tylenol for discomfort first and then only use the narcotic if needed.  Follow-up on April 24 Monday.  Call [phone] to get the appointment.  If you have any questions or issues call [phone]  ? Increase activity slowly   Complete by: As directed ?  ? ?  ? ?Allergies as of 12/11/2021   ? ?   Reactions  ? Penicillins Other (See Comments)  ? Childhood reaction  ? Vancomycin Itching  ? Redness and itching after infusion - infuse as slower rate   ? ?  ? ?  ?Medication List  ?  ? ?TAKE these medications   ? ?  clindamycin 300 MG capsule ?Commonly known as: CLEOCIN ?Take 1 capsule (300 mg total) by mouth 3 (three) times daily for 10 days. ?  ?gabapentin 100 MG capsule ?Commonly known as: NEURONTIN ?Take 100 mg by mouth daily. ?  ?glipiZIDE 2.5 MG 24 hr tablet ?Commonly known as: GLUCOTROL XL ?Take 2.5 mg by mouth daily with breakfast. ?  ?HYDROcodone-acetaminophen 7.5-325 mg/15 ml solution ?Commonly known as: HYCET ?Take 15 mLs by mouth 4 (four) times daily as needed for moderate pain. ?  ?insulin detemir 100 unit/ml Soln ?Commonly known as: LEVEMIR ?Inject 30 Units into the skin in the morning. ?  ?lisinopril 2.5 MG tablet ?Commonly known as: ZESTRIL ?Take 2.5 mg by mouth daily. Reported on 02/13/2016 ?  ?Medihoney Wound/Burn Dressing Gel ?Apply 1 g topically daily. ?  ?naproxen sodium 220 MG  tablet ?Commonly known as: ALEVE ?Take 440 mg by mouth daily as needed (pain/headache). ?  ?pravastatin 40 MG tablet ?Commonly known as: PRAVACHOL ?Take 40 mg by mouth daily. ?  ?traZODone 50 MG tablet ?Commonly known as: DESYREL ?Take 1 tablet (50 mg total) by mouth at bedtime. ?  ?Vitamin D (Ergocalciferol) 1.25 MG (50000 UNIT) Caps capsule ?Commonly known as: DRISDOL ?Take 50,000 Units by mouth every 7 (seven) days. ?  ? ?  ? ? ? ?Signed: ?Morrison Mcbryar ?12/11/2021, 8:37 AM ? ? ?

## 2021-12-12 ENCOUNTER — Encounter (INDEPENDENT_AMBULATORY_CARE_PROVIDER_SITE_OTHER): Payer: Self-pay | Admitting: Otolaryngology

## 2021-12-12 ENCOUNTER — Telehealth (INDEPENDENT_AMBULATORY_CARE_PROVIDER_SITE_OTHER): Payer: Self-pay | Admitting: Otolaryngology

## 2021-12-12 ENCOUNTER — Other Ambulatory Visit (INDEPENDENT_AMBULATORY_CARE_PROVIDER_SITE_OTHER): Payer: Self-pay | Admitting: Otolaryngology

## 2021-12-12 ENCOUNTER — Encounter (HOSPITAL_COMMUNITY): Payer: Self-pay | Admitting: Otolaryngology

## 2021-12-12 MED ORDER — HYDROCODONE-ACETAMINOPHEN 7.5-325 MG/15ML PO SOLN: 15.0000 mL | Freq: Four times a day (QID) | ORAL | 0 refills | Status: DC | PRN

## 2021-12-12 NOTE — Telephone Encounter (Signed)
Patient called in he could not get the liquid pain medication at the previous pharmacy and I changed it to the [facility]. ?

## 2021-12-13 ENCOUNTER — Telehealth (INDEPENDENT_AMBULATORY_CARE_PROVIDER_SITE_OTHER): Payer: Self-pay | Admitting: Otolaryngology

## 2021-12-13 MED ORDER — HYDROCODONE-ACETAMINOPHEN 7.5-325 MG/15ML PO SOLN: 15.0000 mL | Freq: Four times a day (QID) | ORAL | 0 refills | Status: DC | PRN

## 2021-12-13 NOTE — Telephone Encounter (Signed)
The [facility] did not have the med either so now it is called in to [facility] [address].  ?

## 2021-12-14 ENCOUNTER — Encounter (HOSPITAL_COMMUNITY): Payer: Self-pay

## 2021-12-14 ENCOUNTER — Ambulatory Visit (INDEPENDENT_AMBULATORY_CARE_PROVIDER_SITE_OTHER): Payer: No Payment, Other | Admitting: Licensed Clinical Social Worker

## 2021-12-14 DIAGNOSIS — F322 Major depressive disorder, single episode, severe without psychotic features: Secondary | ICD-10-CM | POA: Diagnosis not present

## 2021-12-14 NOTE — Plan of Care (Signed)
Pt agreeable to plan  ?
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
## 2021-12-25 ENCOUNTER — Encounter (INDEPENDENT_AMBULATORY_CARE_PROVIDER_SITE_OTHER): Payer: Self-pay | Admitting: Otolaryngology

## 2021-12-25 ENCOUNTER — Ambulatory Visit (INDEPENDENT_AMBULATORY_CARE_PROVIDER_SITE_OTHER): Payer: BLUE CROSS/BLUE SHIELD | Admitting: Otolaryngology

## 2021-12-25 VITALS — BP 130/92 | HR 102 | Ht 71.0 in | Wt 222.0 lb

## 2021-12-25 DIAGNOSIS — S02609D Fracture of mandible, unspecified, subsequent encounter for fracture with routine healing: Secondary | ICD-10-CM

## 2021-12-25 MED ORDER — HYDROCODONE-ACETAMINOPHEN 7.5-325 MG/15ML PO SOLN
15.0000 mL | Freq: Four times a day (QID) | ORAL | 0 refills | Status: DC | PRN
Start: 2021-12-25 — End: 2022-09-18
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
## 2022-01-04 ENCOUNTER — Telehealth (HOSPITAL_COMMUNITY): Payer: Self-pay | Admitting: Licensed Clinical Social Worker

## 2022-01-04 ENCOUNTER — Ambulatory Visit (HOSPITAL_COMMUNITY): Payer: BLUE CROSS/BLUE SHIELD | Admitting: Licensed Clinical Social Worker

## 2022-01-04 ENCOUNTER — Encounter (HOSPITAL_COMMUNITY): Payer: Self-pay

## 2022-01-04 NOTE — Telephone Encounter (Signed)
Pt sent two links with no response. LCSW f/u with a PC. Pt did answer and LCSW spoke with pt about his [place]. Pt denied moving forward with therapy until insurance could be figured out. LCSW canceled remaining appointment. Pt does report he will be getting [employer] in 2 days. LCSW recommended when that becomes active to call insurance to get in network list.  ?

## 2022-01-09 ENCOUNTER — Ambulatory Visit: Payer: BLUE CROSS/BLUE SHIELD | Admitting: Student

## 2022-01-09 DIAGNOSIS — S02609D Fracture of mandible, unspecified, subsequent encounter for fracture with routine healing: Secondary | ICD-10-CM | POA: Diagnosis not present
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
## 2022-01-19 ENCOUNTER — Ambulatory Visit (INDEPENDENT_AMBULATORY_CARE_PROVIDER_SITE_OTHER): Payer: BLUE CROSS/BLUE SHIELD | Admitting: Otolaryngology

## 2022-01-19 DIAGNOSIS — S0269XD Fracture of mandible of other specified site, subsequent encounter for fracture with routine healing: Secondary | ICD-10-CM

## 2022-01-19 DIAGNOSIS — S02609D Fracture of mandible, unspecified, subsequent encounter for fracture with routine healing: Secondary | ICD-10-CM

## 2022-01-19 NOTE — Progress Notes (Signed)
Dustin Baker is an 51 y.o. male.   Chief Complaint: mandible fracture HPI: hx of fracture 6 weeks ago. He was having pain after talking for over an hour but he is great now. He is sure it was muscle pain. He has no chronic pain. His occlusion feels great.   Past Medical History:  Diagnosis Date   Allergy    Diabetes mellitus without complication (HCC)    Diabetes mellitus, type II (HCC)    GERD (gastroesophageal reflux disease)    High cholesterol    Hives of unknown origin    Hypertension    Neuropathy     Past Surgical History:  Procedure Laterality Date   none     ORIF MANDIBULAR FRACTURE N/A 12/10/2021   Procedure: OPEN REDUCTION INTERNAL FIXATION (ORIF) MANDIBULAR FRACTURE;  Surgeon: Suzanna Obey, MD;  Location: [facility];  Service: ENT;  Laterality: N/A;   VASECTOMY      Family History  Problem Relation Age of Onset   Depression Mother    Diabetes Father    Diabetes Paternal Grandfather    Social History:  reports that he has never smoked. He has never used smokeless tobacco. He reports current alcohol use. He reports that he does not use drugs.  Allergies:  Allergies  Allergen Reactions   Penicillins Other (See Comments)    Childhood reaction   Vancomycin Itching    Redness and itching after infusion - infuse as slower rate     (Not in a hospital admission)   No results found for this or any previous visit (from the past 48 hour(s)). No results found.   There were no vitals taken for this visit.  PHYSICAL EXAM: Appearance _ awake alert with no distress.  Head- atraumatic and no obvious abnormalities Eyes- PERRL, EOMI, no conjunctiva injection or ecchymosis Ears-  Right- Pinna without inflammation or swelling. canal without obstruction or injury. TM within normal limits  Left- Pinna without inflammation or swelling. canal without obstruction or injury. TM within normal limits Oc/OP- no lesions or excessive swelling. Rubber bands removed. There is no  palpable pain with attempts to move the fracture site. There is no movement. No pain in the angle. There is a central lower incisor that is elevated. No swelling of face or gingiva Hp/Larynx- normal voice and no airway issues. No swelling or lesions Neck- no mass or lesions. Normal movement.  Neuro- CNII-XII intact, no sensory deficits.  Lungs- normal effort no distress noted      Assessment/Plan Mandible fracture- it appears he is healed and no pain elicited. He can stop the rubber bands and will schedule the removal of the arch bars on Monday. Soft diet still.   Suzanna Obey 01/19/2022, 1:14 PM

## 2022-01-22 ENCOUNTER — Ambulatory Visit (INDEPENDENT_AMBULATORY_CARE_PROVIDER_SITE_OTHER): Payer: BLUE CROSS/BLUE SHIELD | Admitting: Otolaryngology

## 2022-01-22 ENCOUNTER — Ambulatory Visit (HOSPITAL_COMMUNITY): Payer: No Payment, Other | Admitting: Licensed Clinical Social Worker

## 2022-01-22 DIAGNOSIS — S02609D Fracture of mandible, unspecified, subsequent encounter for fracture with routine healing: Secondary | ICD-10-CM

## 2022-01-22 DIAGNOSIS — S0269XD Fracture of mandible of other specified site, subsequent encounter for fracture with routine healing: Secondary | ICD-10-CM

## 2022-01-22 NOTE — Progress Notes (Signed)
Dustin Baker is an 51 y.o. male.   Chief Complaint: mandible fracture HPI: here for arch bar removal. No pain or complaints  Past Medical History:  Diagnosis Date   Allergy    Diabetes mellitus without complication (HCC)    Diabetes mellitus, type II (HCC)    GERD (gastroesophageal reflux disease)    High cholesterol    Hives of unknown origin    Hypertension    Neuropathy     Past Surgical History:  Procedure Laterality Date   none     ORIF MANDIBULAR FRACTURE N/A 12/10/2021   Procedure: OPEN REDUCTION INTERNAL FIXATION (ORIF) MANDIBULAR FRACTURE;  Surgeon: Suzanna Obey, MD;  Location: [facility] OR;  Service: ENT;  Laterality: N/A;   VASECTOMY      Family History  Problem Relation Age of Onset   Depression Mother    Diabetes Father    Diabetes Paternal Grandfather    Social History:  reports that he has never smoked. He has never used smokeless tobacco. He reports current alcohol use. He reports that he does not use drugs.  Allergies:  Allergies  Allergen Reactions   Penicillins Other (See Comments)    Childhood reaction   Vancomycin Itching    Redness and itching after infusion - infuse as slower rate     (Not in a hospital admission)   No results found for this or any previous visit (from the past 48 hour(s)). No results found.  There were no vitals taken for this visit.  PHYSICAL EXAM: Appearance _ awake alert with no distress.  Head- atraumatic and no obvious abnormalities Eyes- PERRL, EOMI, no conjunctiva injection or ecchymosis Ears-  Right- Pinna without inflammation or swelling. canal without obstruction or injury. TM within normal limits  Left- Pinna without inflammation or swelling. canal without obstruction or injury. TM within normal limits Oc/OP- no lesions or excessive swelling. Mouth opening normal. Arch bars removed without problem. No bleeding. There is a tooth left lower tht has lost filling and needs dentist Hp/Larynx- normal voice and no airway  issues. No swelling or lesions Neck- no mass or lesions. Normal movement.  Neuro- CNII-XII intact, no sensory deficits.  Lungs- normal effort no distress noted     Assessment/Plan Mandible fracture- arch bars removed without issue. He is doing well with no pain and occlusion is great. He will see dentist right away. He will rinse with mouthrinse for week. Follow up as needed  [name] 01/22/2022, 9:41 AM

## 2022-09-05 ENCOUNTER — Ambulatory Visit (INDEPENDENT_AMBULATORY_CARE_PROVIDER_SITE_OTHER): Payer: 59 | Admitting: Podiatry

## 2022-09-05 ENCOUNTER — Ambulatory Visit (INDEPENDENT_AMBULATORY_CARE_PROVIDER_SITE_OTHER): Payer: 59

## 2022-09-05 DIAGNOSIS — L97512 Non-pressure chronic ulcer of other part of right foot with fat layer exposed: Secondary | ICD-10-CM

## 2022-09-05 DIAGNOSIS — L84 Corns and callosities: Secondary | ICD-10-CM

## 2022-09-05 DIAGNOSIS — L03115 Cellulitis of right lower limb: Secondary | ICD-10-CM | POA: Diagnosis not present

## 2022-09-05 DIAGNOSIS — E1165 Type 2 diabetes mellitus with hyperglycemia: Secondary | ICD-10-CM

## 2022-09-05 MED ORDER — SULFAMETHOXAZOLE-TRIMETHOPRIM 800-160 MG PO TABS: 1.0000 | ORAL_TABLET | Freq: Two times a day (BID) | ORAL | 0 refills | Status: DC

## 2022-09-05 NOTE — Patient Instructions (Signed)
Wash foot with soap and water, dry well. Apply a small amount of medihoney on the wound and then cover the ulcer.   Wear the offloading shoe  Monitor for any signs/symptoms of infection. Call the office immediately if any occur or go directly to the emergency room. Call with any questions/concerns.

## 2022-09-05 NOTE — Progress Notes (Signed)
Subjective:   Patient ID: Dustin Baker, male   DOB: 52 y.o.   MRN: [id number]   HPI Chief Complaint  Patient presents with   Wound Check    Pt states he has a callus on his right foot that is causing him issues. He states it heals up and then it started cracking and bleeding again. He wants to know if there is anyway to prevent this because he is a daibetic      51 year old male presents the office today with above concerns.  After last time he went to the wound care center and it helaled. Last year it started to callus and crack open. It was bleeding this morning so he put a bandage on it.  He denies any fevers or chills currently.  His sugar is "way off". He did not have insurance for awhile and he has not been on medication. Last A1c on 12/10/2021 was 11.5.    Review of Systems  All other systems reviewed and are negative.       Objective:  Physical Exam  General: AAO x3, NAD  Dermatological: On the right foot submetatarsal 4 is a hyperkeratotic lesion with central wound.  After debridement there is a granular wound measuring 0.7 x 0.5 x 0.5 cm.  Prior to debridement small measuring 0.3 x 0.3 x 0.3 cm.  There is no probing to bone, and or tunneling.  There is mild surrounding edema and erythema.  There is no fluctuation or crepitation.  There is no malodor.   Vascular: Dorsalis Pedis artery and Posterior Tibial artery pedal pulses are 2/4 bilateral with immedate capillary fill time.  There is no pain with calf compression, swelling, warmth, erythema.   Neruologic: Sensation decreased  Musculoskeletal: No gross boney pedal deformities bilateral. No pain, crepitus, or limitation noted with foot and ankle range of motion bilateral. Muscular strength 5/5 in all groups tested bilateral.  Gait: Unassisted, Nonantalgic.       Assessment:   Right foot ulceration; uncontrolled diabetes     Plan:  -Treatment options discussed including all alternatives, risks, and  complications -Etiology of symptoms were discussed -X-rays were obtained and reviewed with the patient.  3 views of the right foot were obtained.  No evidence of cortical destruction suggestive of osteomyelitis at this time there is no soft tissue edema. -Today I sharply debrided the wound as it is medically necessary to remove nonviable devitalized tissue.  I utilized the #312 with scalpel in order to debride the wound down to healthy, granular tissue.  Removed nonviable, devitalized tissue in order promote wound healing.  There is no significant blood loss.  I cleaned the area with saline.  Medihoney was applied followed by dressing.  I would like for him to continue with daily dressing changes with the same.  Continue with offloading. -Bactrim -Long-term I do think a benefit from orthotics.  Will measure next appointment once the swelling improves. -Monitor for any clinical signs or symptoms of infection and directed to call the office immediately should any occur or go to the ER.  Trula Slade DPM

## 2022-09-18 ENCOUNTER — Encounter: Payer: Self-pay | Admitting: Family Medicine

## 2022-09-18 ENCOUNTER — Ambulatory Visit (INDEPENDENT_AMBULATORY_CARE_PROVIDER_SITE_OTHER): Payer: 59 | Admitting: Family Medicine

## 2022-09-18 VITALS — BP 120/90 | HR 95 | Temp 98.6°F | Ht 69.75 in | Wt 229.3 lb

## 2022-09-18 DIAGNOSIS — E1165 Type 2 diabetes mellitus with hyperglycemia: Secondary | ICD-10-CM

## 2022-09-18 DIAGNOSIS — E1140 Type 2 diabetes mellitus with diabetic neuropathy, unspecified: Secondary | ICD-10-CM | POA: Diagnosis not present

## 2022-09-18 DIAGNOSIS — R6889 Other general symptoms and signs: Secondary | ICD-10-CM | POA: Diagnosis not present

## 2022-09-18 DIAGNOSIS — I10 Essential (primary) hypertension: Secondary | ICD-10-CM | POA: Diagnosis not present

## 2022-09-18 DIAGNOSIS — Z20828 Contact with and (suspected) exposure to other viral communicable diseases: Secondary | ICD-10-CM

## 2022-09-18 DIAGNOSIS — E782 Mixed hyperlipidemia: Secondary | ICD-10-CM

## 2022-09-18 DIAGNOSIS — Z1211 Encounter for screening for malignant neoplasm of colon: Secondary | ICD-10-CM

## 2022-09-18 LAB — POCT GLYCOSYLATED HEMOGLOBIN (HGB A1C): Hemoglobin A1C: 12.1 % — AB (ref 4.0–5.6)

## 2022-09-18 LAB — POCT INFLUENZA A/B
Influenza A, POC: NEGATIVE
Influenza B, POC: NEGATIVE

## 2022-09-18 MED ORDER — LISINOPRIL 2.5 MG PO TABS: 2.5000 mg | ORAL_TABLET | Freq: Every day | ORAL | 3 refills | Status: DC

## 2022-09-18 MED ORDER — GABAPENTIN 300 MG PO CAPS: 300.0000 mg | ORAL_CAPSULE | Freq: Three times a day (TID) | ORAL | 1 refills | Status: DC

## 2022-09-18 MED ORDER — PRAVASTATIN SODIUM 40 MG PO TABS: 40.0000 mg | ORAL_TABLET | Freq: Every day | ORAL | 1 refills | Status: DC

## 2022-09-18 MED ORDER — GLIPIZIDE ER 5 MG PO TB24: 5.0000 mg | ORAL_TABLET | Freq: Every day | ORAL | 3 refills | Status: DC

## 2022-09-18 MED ORDER — METFORMIN HCL 1000 MG PO TABS: 1000.0000 mg | ORAL_TABLET | Freq: Two times a day (BID) | ORAL | 3 refills | Status: AC

## 2022-09-18 MED ORDER — OSELTAMIVIR PHOSPHATE 75 MG PO CAPS: 75.0000 mg | ORAL_CAPSULE | Freq: Every day | ORAL | 0 refills | Status: AC
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
## 2022-09-18 NOTE — Assessment & Plan Note (Signed)
Will resume the pravachol 40 mg daily and he will need a new lipid panel today.

## 2022-09-18 NOTE — Assessment & Plan Note (Addendum)
Uncontrolled with skin complications (foot ulcer being followed by podiatry) and diabetic neuropathy. Will start patient on metformin 1000 mg BID and increase the glipizide ER to 10 mg daily. Checking CMP today, will RTC every 3 months until A1C is under control. I am also restarting and increasing his gabapentin 300 mg, titrating up to 300 mg TID.

## 2022-09-18 NOTE — Assessment & Plan Note (Signed)
Systolic pressure is normal however his diastolic is elevated, he will resume the lisinopril 2.5 mg daily. RTC in 3 months.

## 2022-09-19 ENCOUNTER — Telehealth: Payer: Self-pay | Admitting: Family Medicine

## 2022-09-19 LAB — COMPREHENSIVE METABOLIC PANEL
ALT: 16 U/L (ref 0–53)
AST: 17 U/L (ref 0–37)
Albumin: 3.9 g/dL (ref 3.5–5.2)
Alkaline Phosphatase: 55 U/L (ref 39–117)
BUN: 9 mg/dL (ref 6–23)
CO2: 25 mEq/L (ref 19–32)
Calcium: 8.9 mg/dL (ref 8.4–10.5)
Chloride: 99 mEq/L (ref 96–112)
Creatinine, Ser: 0.8 mg/dL (ref 0.40–1.50)
GFR: 103.62 mL/min (ref >60.00)
Glucose, Bld: 231 mg/dL — ABNORMAL HIGH (ref 70–99)
Potassium: 3.9 mEq/L (ref 3.5–5.1)
Sodium: 134 mEq/L — ABNORMAL LOW (ref 135–145)
Total Bilirubin: 0.5 mg/dL (ref 0.2–1.2)
Total Protein: 7.7 g/dL (ref 6.0–8.3)

## 2022-09-19 LAB — TSH: TSH: 0.95 u[IU]/mL (ref 0.35–5.50)

## 2022-09-19 LAB — MICROALBUMIN / CREATININE URINE RATIO
Creatinine,U: 195 mg/dL
Microalb Creat Ratio: 13.3 mg/g (ref 0.0–30.0)
Microalb, Ur: 25.9 mg/dL — ABNORMAL HIGH (ref 0.0–1.9)

## 2022-09-19 LAB — LIPID PANEL
Cholesterol: 208 mg/dL — ABNORMAL HIGH (ref 0–200)
HDL: 42.8 mg/dL (ref >39.00)
LDL Cholesterol: 126 mg/dL — ABNORMAL HIGH (ref 0–99)
NonHDL: 165.43
Total CHOL/HDL Ratio: 5
Triglycerides: 198 mg/dL — ABNORMAL HIGH (ref 0.0–149.0)
VLDL: 39.6 mg/dL (ref 0.0–40.0)

## 2022-09-19 NOTE — Telephone Encounter (Signed)
Pt call and stated he is feeling worse and want a note to be out of work for the rest of the week the patient want a call back

## 2022-09-20 ENCOUNTER — Other Ambulatory Visit: Payer: 59

## 2022-09-20 ENCOUNTER — Ambulatory Visit: Payer: 59 | Admitting: Podiatry

## 2022-09-20 NOTE — Telephone Encounter (Signed)
Ok to write a work excuse for the patient

## 2022-09-20 NOTE — Telephone Encounter (Signed)
Patient informed letter has been composed and sent via [facility]

## 2022-09-25 ENCOUNTER — Ambulatory Visit (INDEPENDENT_AMBULATORY_CARE_PROVIDER_SITE_OTHER): Payer: 59 | Admitting: Podiatry

## 2022-09-25 DIAGNOSIS — E1165 Type 2 diabetes mellitus with hyperglycemia: Secondary | ICD-10-CM | POA: Diagnosis not present

## 2022-09-25 DIAGNOSIS — M216X2 Other acquired deformities of left foot: Secondary | ICD-10-CM | POA: Diagnosis not present

## 2022-09-25 DIAGNOSIS — L97512 Non-pressure chronic ulcer of other part of right foot with fat layer exposed: Secondary | ICD-10-CM

## 2022-09-25 DIAGNOSIS — M216X1 Other acquired deformities of right foot: Secondary | ICD-10-CM | POA: Diagnosis not present

## 2022-09-25 NOTE — Patient Instructions (Signed)
Continue medihoney dressing changes daily. Offload the area daily with pads. Monitor for any signs/symptoms of infection. Call the office immediately if any occur or go directly to the emergency room. Call with any questions/concerns.

## 2022-09-26 ENCOUNTER — Other Ambulatory Visit: Payer: 59

## 2022-09-30 NOTE — Progress Notes (Signed)
Subjective:   Patient ID: Dustin Baker, male   DOB: 52 y.o.   MRN: [id number]   HPI Chief Complaint  Patient presents with   Wound Check    Right foot, patient stated healing well     52 year old male presents the office today with above concerns.  States that he feels the wound is healing.  Not seeing any drainage or pus.  Area has scabbed over.  No fevers or chills.  No other concerns.    Last A1c was 12.1 on September 18, 2022   Review of Systems  All other systems reviewed and are negative.       Objective:  Physical Exam  General: AAO x3, NAD  Dermatological: On the right foot submetatarsal 4 is a hyperkeratotic lesion with central wound.  There was scabbing, some necrotic tissue present I was able to debride this down to healthy, granular tissue.  After debridement there is still small ulceration measuring 0.2 x 0.2 x 0.1 cm without any probing to bone, amount or tunneling.  There is no surrounding erythema, ascending cellulitis.  There is no fluctuance or crepitation.  No malodor.  No other ulcerations are present.   Vascular: Dorsalis Pedis artery and Posterior Tibial artery pedal pulses are 2/4 bilateral with immedate capillary fill time.  There is no pain with calf compression, swelling, warmth, erythema.   Neruologic: Sensation decreased  Musculoskeletal: No gross boney pedal deformities bilateral. No pain, crepitus, or limitation noted with foot and ankle range of motion bilateral. Muscular strength 5/5 in all groups tested bilateral.  Gait: Unassisted, Nonantalgic.       Assessment:   Right foot ulceration; uncontrolled diabetes     Plan:  -Treatment options discussed including all alternatives, risks, and complications -Etiology of symptoms were discussed -Today I sharply debrided the wound as it is medically necessary to remove nonviable devitalized tissue.  I utilized the #312 with scalpel in order to debride the wound down to healthy, granular  tissue.  Removed nonviable, devitalized tissue in order promote wound healing.  There is no significant blood loss.  Unable to measure the wound prior to debridement and after debridement it was approximately 0.2 x 0.2 x 0.1 cm.  I cleaned the area with saline.  Medihoney was applied followed by dressing.  I would like for him to continue with daily dressing changes with the same.  Continue with offloading. -Measured for orthotics to help offload. -Monitor for any clinical signs or symptoms of infection and directed to call the office immediately should any occur or go to the ER.  Trula Slade DPM

## 2022-10-15 ENCOUNTER — Telehealth: Payer: Self-pay | Admitting: Family Medicine

## 2022-10-15 NOTE — Telephone Encounter (Signed)
Pt was last seen on 09/18/22.  Pt called to inform MD that since he started taking the metFORMIN (GLUCOPHAGE) 1000 MG tablet   Pt has been experiencing extreme diarrhea and stomach cramps.   Pt stopped taking the Metformin on Friday, 10/12/22.  Pt is asking if MD thinks it would be a good idea to lower the dosage or should he start taking an alternative?  Please advise.  Pt is asking for a call back.   [facility] [id number] [place], [place] Phone: [phone]  Fax: [phone]

## 2022-10-15 NOTE — Telephone Encounter (Signed)
Left a detailed message at the patient's cell number with the instructions as below.

## 2022-10-15 NOTE — Telephone Encounter (Signed)
Have patient reduce the metformin to 500 mg twice daily-- he can cut the tablets in half to see if this reduces the stomach upset.

## 2022-11-06 ENCOUNTER — Ambulatory Visit (INDEPENDENT_AMBULATORY_CARE_PROVIDER_SITE_OTHER): Payer: 59 | Admitting: Podiatry

## 2022-11-06 DIAGNOSIS — E1165 Type 2 diabetes mellitus with hyperglycemia: Secondary | ICD-10-CM

## 2022-11-06 DIAGNOSIS — L97512 Non-pressure chronic ulcer of other part of right foot with fat layer exposed: Secondary | ICD-10-CM | POA: Diagnosis not present

## 2022-11-06 DIAGNOSIS — M216X2 Other acquired deformities of left foot: Secondary | ICD-10-CM

## 2022-11-06 NOTE — Progress Notes (Signed)
Subjective:   Patient ID: Dustin Baker, male   DOB: 52 y.o.   MRN: [id number]   HPI No chief complaint on file.    52 year old male presents the office today for ulceration right foot.  States that he is doing well.  He has not seen any drainage or pus coming from the area.  He does state that over the weekend he was doing a lot of walking he was in a corn hole tournament.  Denies any fevers or chills.   Last A1c was 12.1 on September 18, 2022     Objective:  Physical Exam  General: AAO x3, NAD  Dermatological: On the right foot submetatarsal 4 is a hyperkeratotic lesion with central wound.  Prior to debridement the wound was covered with callus.  After debrided the wound there is still a small wound measuring 0.2 x 0.2 x 0.1 cm without any probing, undermining or tunneling.  There is no drainage or pus.  There is no fluctuance or crepitation.  No malodor.   Vascular: Dorsalis Pedis artery and Posterior Tibial artery pedal pulses are palpable bilateral with immedate capillary fill time.  There is no pain with calf compression, swelling, warmth, erythema.   Neruologic: Sensation decreased  Musculoskeletal: Prominent metatarsal head plantarly.  Gait: Unassisted, Nonantalgic.       Assessment:   Right foot ulceration; uncontrolled diabetes     Plan:  -Treatment options discussed including all alternatives, risks, and complications -Etiology of symptoms were discussed -Today I sharply debrided the wound as it is medically necessary to remove nonviable devitalized tissue.  I utilized the #312 with scalpel in order to debride the wound down to healthy, granular tissue.  Removed nonviable, devitalized tissue in order promote wound healing.  There is no significant blood loss.  Unable to measure the wound prior to debridement and after debridement it was approximately 0.2 x 0.2 x 0.1 cm.  I cleaned the area with saline.  Medihoney was applied followed by dressing.  I would like for  him to continue with daily dressing changes with the same.  Continue with offloading. -Measured for orthotics to help offload. -Monitor for any clinical signs or symptoms of infection and directed to call the office immediately should any occur or go to the ER.  Trula Slade DPM       Little swollen, did a lot of walking over the weekend, cornhole tourniment

## 2022-11-07 ENCOUNTER — Other Ambulatory Visit: Payer: 59

## 2022-11-17 ENCOUNTER — Other Ambulatory Visit: Payer: Self-pay | Admitting: Family Medicine

## 2022-11-17 DIAGNOSIS — E1140 Type 2 diabetes mellitus with diabetic neuropathy, unspecified: Secondary | ICD-10-CM

## 2022-12-18 ENCOUNTER — Ambulatory Visit: Payer: 59 | Admitting: Family Medicine

## 2022-12-18 ENCOUNTER — Encounter: Payer: Self-pay | Admitting: Family Medicine

## 2022-12-18 VITALS — BP 120/90 | HR 88 | Temp 98.6°F | Ht 69.75 in | Wt 241.3 lb

## 2022-12-18 DIAGNOSIS — R051 Acute cough: Secondary | ICD-10-CM | POA: Diagnosis not present

## 2022-12-18 DIAGNOSIS — E11621 Type 2 diabetes mellitus with foot ulcer: Secondary | ICD-10-CM | POA: Diagnosis not present

## 2022-12-18 DIAGNOSIS — L97509 Non-pressure chronic ulcer of other part of unspecified foot with unspecified severity: Secondary | ICD-10-CM | POA: Diagnosis not present

## 2022-12-18 MED ORDER — BENZONATATE 200 MG PO CAPS: 200.0000 mg | ORAL_CAPSULE | Freq: Two times a day (BID) | ORAL | 0 refills | Status: DC | PRN

## 2022-12-18 MED ORDER — TIRZEPATIDE 2.5 MG/0.5ML ~~LOC~~ SOAJ: 2.5000 mg | SUBCUTANEOUS | 2 refills | Status: DC

## 2022-12-18 NOTE — Assessment & Plan Note (Signed)
Uncontrolled, he is trying to remember to take the oral pills however he is forgetting a lot of the time. We discussed adding mounjaro once weekly to his regimen so that it is easier to remember and he is agreeable to this plan. Starting dose 2.5 mg once weekly. I will see him back in 3 months for repeat A1C. A1C ordered for today as well

## 2022-12-18 NOTE — Patient Instructions (Signed)
Flonase and or claritin will also help

## 2022-12-18 NOTE — Progress Notes (Signed)
Established Patient Office Visit  Subjective   Patient ID: Dustin Baker, male    DOB: [birth year]  Age: 52 y.o. MRN: [id number]  Chief Complaint  Patient presents with   Medical Management of Chronic Issues   Cough    Productive with yellow sputum x1 week, diagnosed with Influenza last week per urgent care visit    Pt reports he was diagnosed with the Flu A last week, states that he was given tamiflu and something else to take. States that he is feeling better but the cough is persistent. States that it is worse when he lays down at night and also during the day. No fevers, no sinus pain, but does have some drainage down the back of his throat, feels a little tight in his chest.   DM-- pt reports that he is forgetting to take his medications sometimes. States he is taking it 2 out of 3 times. States that he has never been good at remembering to take medication. He reports no blurry vision, continues to see the podiatrist or his feet. Has neuropathy and is on gabapentin. I reviewed his labs with him from January.    Current Outpatient Medications  Medication Instructions   benzonatate (TESSALON) 200 mg, Oral, 2 times daily PRN   gabapentin (NEURONTIN) 300 MG capsule TAKE 1 CAPSULE BY MOUTH EVERY NIGHT AT BEDTIME FOR 1 WEEK THEN 1 CAPSULE TWO TIMES A DAY FOR 1 WEEK THEN 1CAPSULE THREE TIMES A DAY   glipiZIDE (GLUCOTROL XL) 5 mg, Oral, Daily with breakfast   lisinopril (ZESTRIL) 2.5 mg, Oral, Daily, Reported on 02/13/2016   metFORMIN (GLUCOPHAGE) 1,000 mg, Oral, 2 times daily with meals, Start with 1/2 tablet twice a day for 1-2 weeks, then increase to 1 tablet twice a day.   pravastatin (PRAVACHOL) 40 mg, Oral, Daily   sulfamethoxazole-trimethoprim (BACTRIM DS) 800-160 MG tablet 1 tablet, Oral, 2 times daily   tirzepatide [facility]) 2.5 mg, Subcutaneous, Weekly    Patient Active Problem List   Diagnosis Date Noted   Mandible open fracture 12/10/2021   Hyperglycemia    Major  depressive disorder, single episode, severe 09/11/2021   Intentional overdose 09/06/2021   Cellulitis 02/24/2021   Pulmonary nodule 02/23/2021   History of MRSA infection 02/23/2021   Cellulitis of trunk 02/23/2021   Anginal chest pain at rest 12/01/2013   Type 2 diabetes mellitus 12/01/2013   Essential hypertension 12/01/2013   Hyperlipidemia 12/01/2013      Review of Systems  All other systems reviewed and are negative.     Objective:     BP (!) 120/90 (BP Location: Left Arm, Patient Position: Sitting, Cuff Size: Normal)   Pulse 88   Temp 98.6 F (37 C) (Oral)   Ht 5' 9.75" (1.772 m)   Wt 241 lb 4.8 oz (109.5 kg)   SpO2 98%   BMI 34.87 kg/m    Physical Exam Vitals reviewed.  Constitutional:      Appearance: Normal appearance. He is well-groomed and normal weight.  HENT:     Right Ear: Tympanic membrane normal.     Left Ear: Tympanic membrane normal.     Nose: Congestion and rhinorrhea present.     Mouth/Throat:     Mouth: Mucous membranes are moist.     Pharynx: No posterior oropharyngeal erythema.  Eyes:     Extraocular Movements: Extraocular movements intact.     Conjunctiva/sclera: Conjunctivae normal.  Neck:     Thyroid: No thyromegaly.  Cardiovascular:  Rate and Rhythm: Normal rate and regular rhythm.     Heart sounds: S1 normal and S2 normal. No murmur heard. Pulmonary:     Effort: Pulmonary effort is normal.     Breath sounds: Normal breath sounds and air entry. No rales.  Musculoskeletal:     Right lower leg: No edema.     Left lower leg: No edema.  Neurological:     General: No focal deficit present.     Mental Status: He is alert and oriented to person, place, and time.     Gait: Gait is intact.  Psychiatric:        Mood and Affect: Mood and affect normal.    Diabetic Foot Exam - Simple   Simple Foot Form Diabetic Foot exam was performed with the following findings: Yes 12/18/2022  4:55 PM  Visual Inspection No deformities, no  ulcerations, no other skin breakdown bilaterally: Yes Sensation Testing See comments: Yes Pulse Check Posterior Tibialis and Dorsalis pulse intact bilaterally: Yes Comments No sensation in the toes BL, has a healing ulcer on the bottom of his right foot       No results found for any visits on 12/18/22.  Last metabolic panel Lab Results  Component Value Date   GLUCOSE 231 (H) 09/18/2022   NA 134 (L) 09/18/2022   K 3.9 09/18/2022   CL 99 09/18/2022   CO2 25 09/18/2022   BUN 9 09/18/2022   CREATININE 0.80 09/18/2022   GFRNONAA >60 12/11/2021   CALCIUM 8.9 09/18/2022   PROT 7.7 09/18/2022   ALBUMIN 3.9 09/18/2022   BILITOT 0.5 09/18/2022   ALKPHOS 55 09/18/2022   AST 17 09/18/2022   ALT 16 09/18/2022   ANIONGAP 8 12/11/2021   Last lipids Lab Results  Component Value Date   CHOL 208 (H) 09/18/2022   HDL 42.80 09/18/2022   LDLCALC 126 (H) 09/18/2022   TRIG 198.0 (H) 09/18/2022   CHOLHDL 5 09/18/2022      The 16-XWRU ASCVD risk score (Arnett DK, et al., 2019) is: 8.5%    Assessment & Plan:   Problem List Items Addressed This Visit       Unprioritized   Type 2 diabetes mellitus - Primary (Chronic)    Uncontrolled, he is trying to remember to take the oral pills however he is forgetting a lot of the time. We discussed adding mounjaro once weekly to his regimen so that it is easier to remember and he is agreeable to this plan. Starting dose 2.5 mg once weekly. I will see him back in 3 months for repeat A1C. A1C ordered for today as well      Relevant Medications   tirzepatide (MOUNJARO) 2.5 MG/0.5ML Pen   Other Relevant Orders   Hemoglobin A1c   Other Visit Diagnoses     Acute cough       secondary to post nasal drip after having the flu, lungs are clear, will rx benzonatate 200 mg BID PRN for cough, ok to use OTC antihistamines also and flonase   Relevant Medications   benzonatate (TESSALON) 200 MG capsule       Return in about 3 months (around  03/19/2023) for DM.    Karie Georges, MD

## 2022-12-20 ENCOUNTER — Encounter: Payer: Self-pay | Admitting: Family Medicine

## 2022-12-20 ENCOUNTER — Telehealth: Payer: Self-pay | Admitting: *Deleted

## 2022-12-20 NOTE — Telephone Encounter (Signed)
-----   Message from Karie Georges, MD sent at 12/20/2022  1:52 PM EDT ----- Pt has not turned in the cologuard yet-- please  send pt a letter ----- Message ----- From: SYSTEM Sent: 12/17/2022  12:14 AM EDT To: Karie Georges, MD

## 2022-12-20 NOTE — Telephone Encounter (Signed)
Letter completed and mailed to the home address. 

## 2023-01-14 ENCOUNTER — Other Ambulatory Visit: Payer: Self-pay | Admitting: Family Medicine

## 2023-01-14 DIAGNOSIS — E1140 Type 2 diabetes mellitus with diabetic neuropathy, unspecified: Secondary | ICD-10-CM

## 2023-01-19 IMAGING — CT CT ABD-PELV W/ CM
2 of 9 series · 14 of 46 positions shown, 16 images · IV contrast (OMNIPAQUE 300)
Comparison: CT abdomen pelvis 02/13/2016

CLINICAL DATA: Lower abdominal pain. Status post bug bite in lower
abdomen/right groin pain. Leukocytosis. History of a cecectomy.

EXAM:
CT ABDOMEN AND PELVIS WITH CONTRAST
TECHNIQUE: Multidetector CT imaging of the abdomen and pelvis was performed
using the standard protocol following bolus administration of
intravenous contrast.
CONTRAST:  100mL OMNIPAQUE IOHEXOL 300 MG/ML  SOLN

[Series 2: axial st · axial · 0.78mm/px · z∈[+1148,+1573]mm · 11 of 103 slices shown, 13 images]
[im 9/103  soft-tissue]
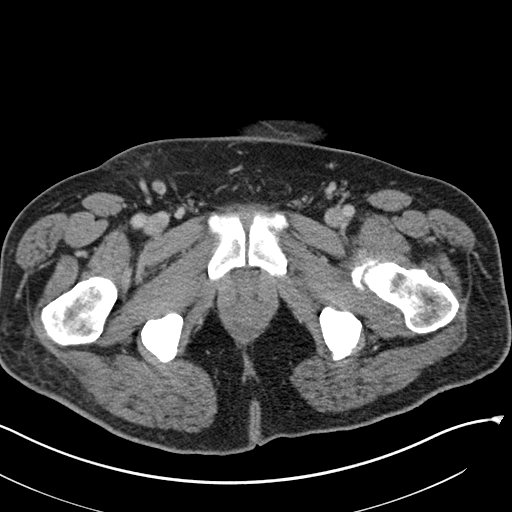
[im 9/103  bone]
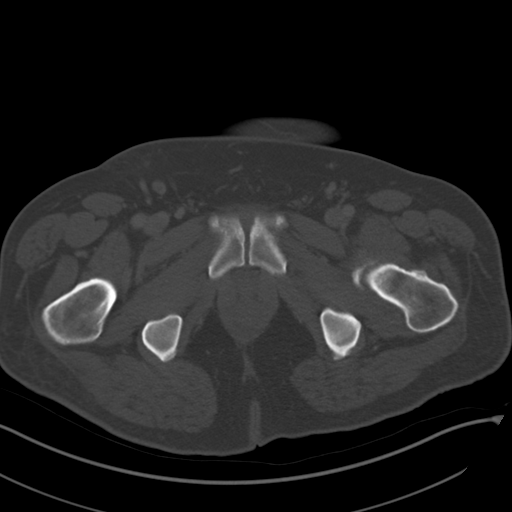
[im 18/103  soft-tissue]
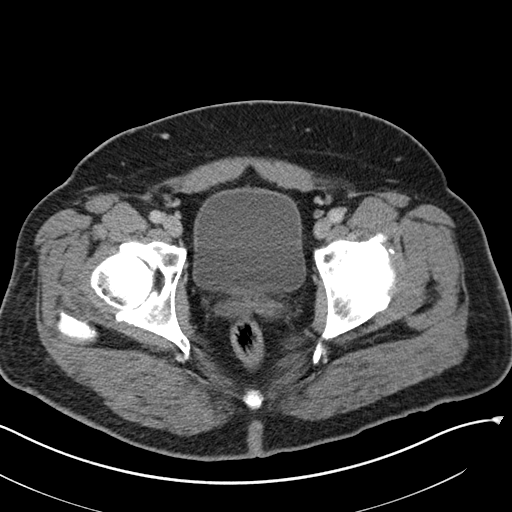
[im 26/103  soft-tissue]
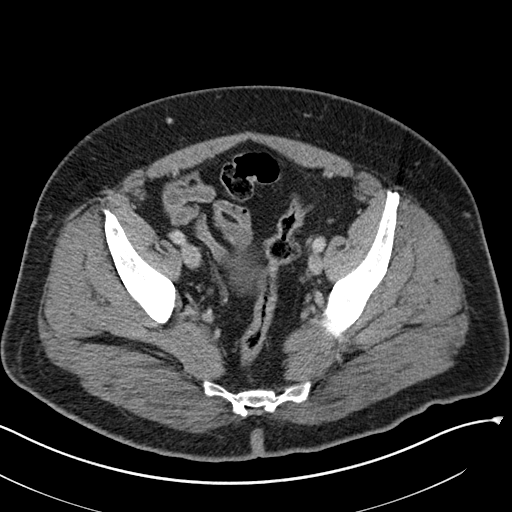
[im 35/103  soft-tissue]
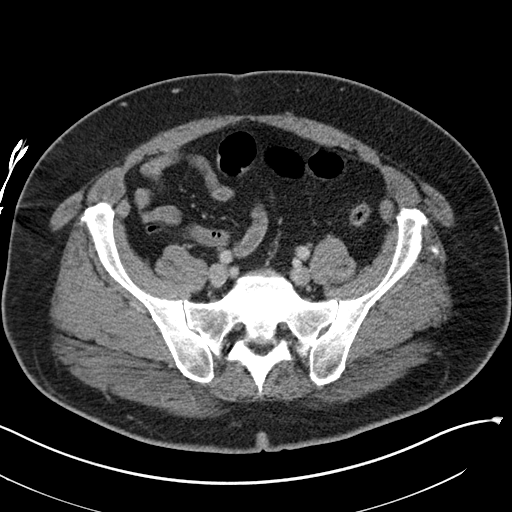
[im 43/103  soft-tissue]
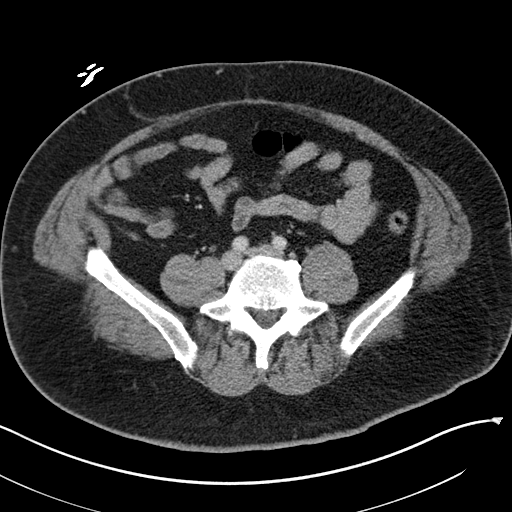
[im 52/103  soft-tissue]
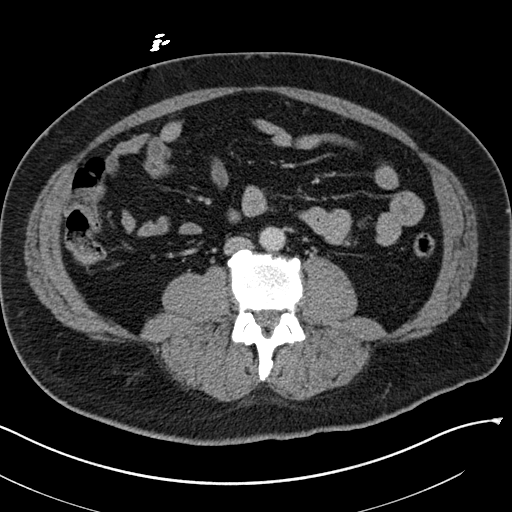
[im 60/103  soft-tissue]
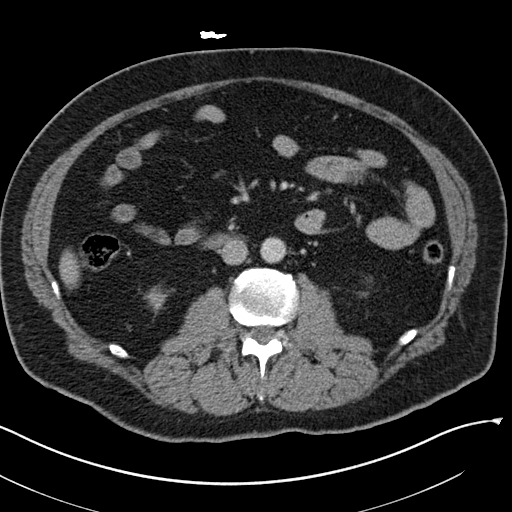
[im 69/103  soft-tissue]
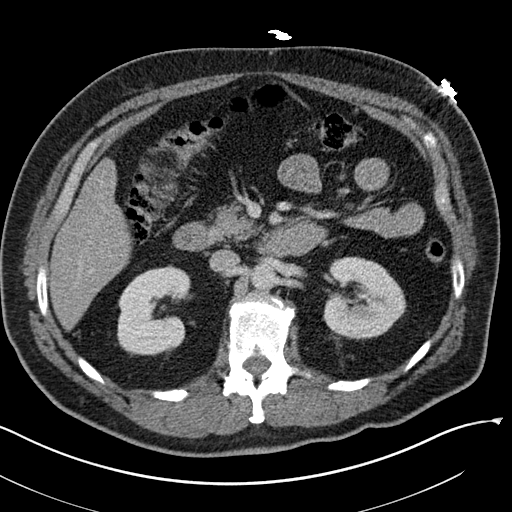
[im 77/103  soft-tissue]
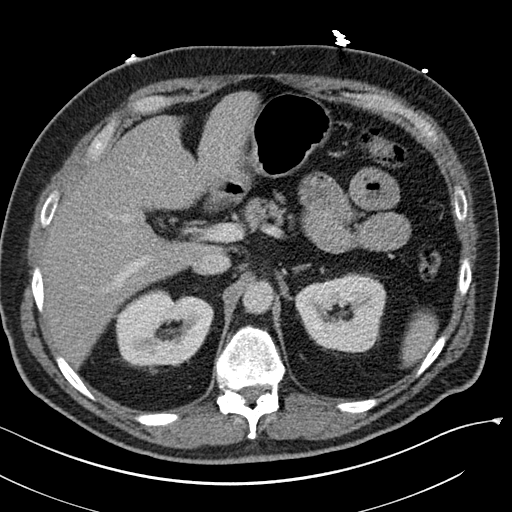
[im 77/103  bone]
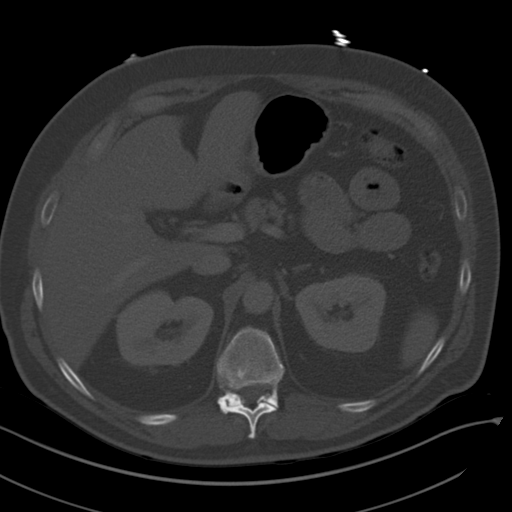
[im 86/103  soft-tissue]
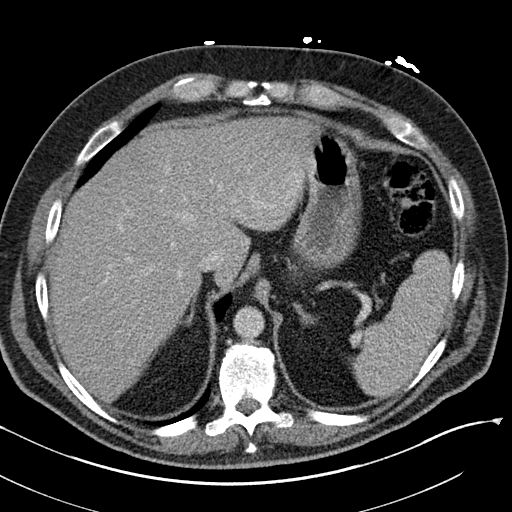
[im 94/103  soft-tissue]
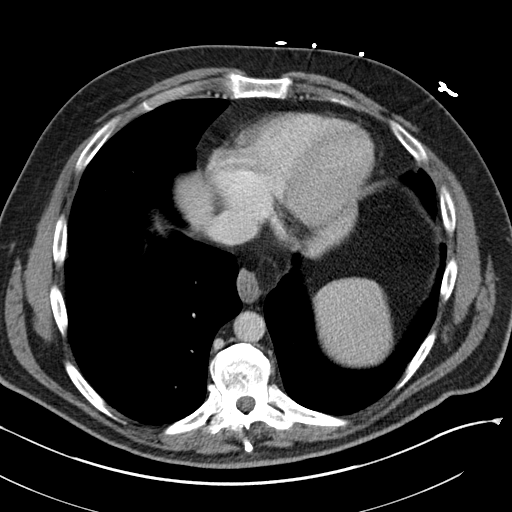

[Series 3: coronal st · coronal · 0.50mm/px · 3 of 137 slices shown]
[im 35/137  soft-tissue]
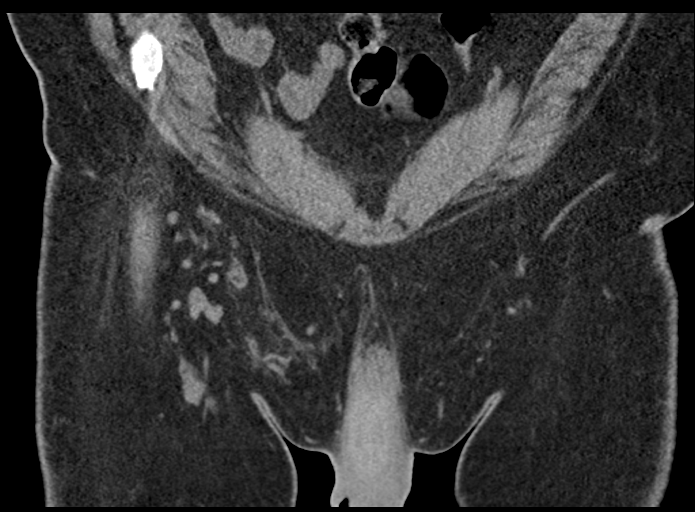
[im 69/137  soft-tissue]
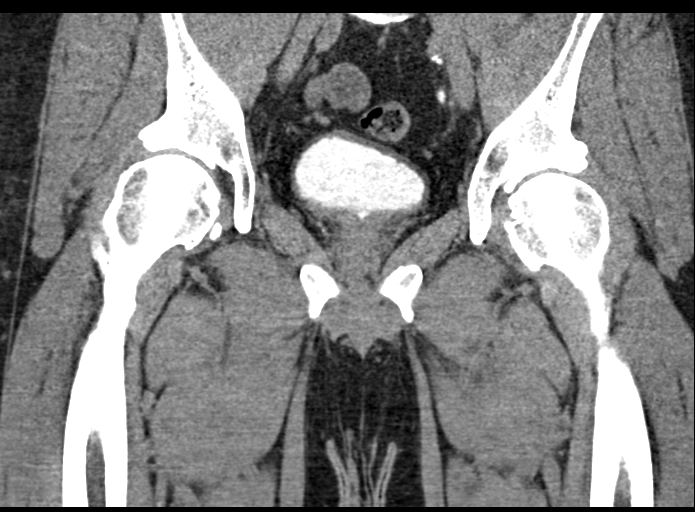
[im 103/137  soft-tissue]
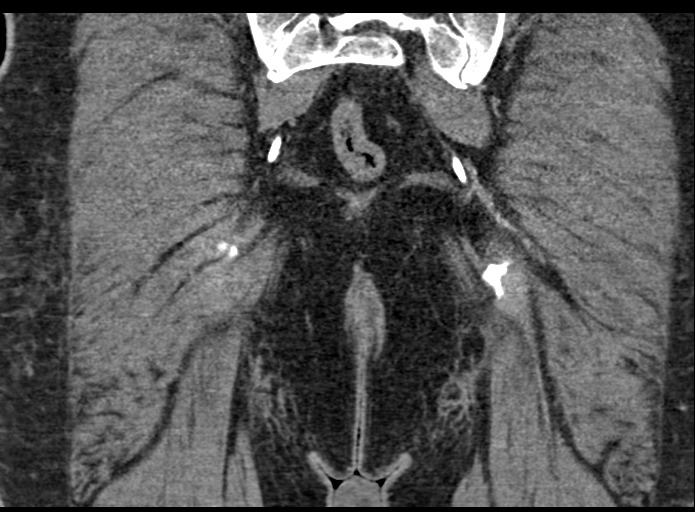

[14 of 46 positions shown; findings below may reference images not displayed]

FINDINGS: Lower chest: Bilateral lower lobe subsegmental atelectasis. Linear
atelectasis within the lingula. Interval development of a 1.1 cm
right middle lobe pulmonary nodule. Tiny hiatal hernia.

Hepatobiliary: No focal liver abnormality. No gallstones,
gallbladder wall thickening, or pericholecystic fluid. No biliary
dilatation.

Pancreas: No focal lesion. Normal pancreatic contour. No surrounding
inflammatory changes. No main pancreatic ductal dilatation.

Spleen: Normal in size without focal abnormality.

Adrenals/Urinary Tract:

No adrenal nodule bilaterally.

Bilateral kidneys enhance symmetrically. No hydronephrosis. No
hydroureter.

The urinary bladder is unremarkable.

On delayed imaging, there is no urothelial wall thickening and there
are no filling defects in the opacified portions of the bilateral
collecting systems or ureters.

Stomach/Bowel: Stomach is within normal limits. No evidence of bowel
wall thickening or dilatation. Appendix appears normal.

Vascular/Lymphatic: No abdominal aorta or iliac aneurysm. Mild
atherosclerotic plaque. No abdominal, pelvic, or inguinal
lymphadenopathy.

Reproductive: Prostate is unremarkable.

Other: No intraperitoneal free fluid. No intraperitoneal free gas.
No organized fluid collection.

Musculoskeletal:

Subcutaneus soft tissue edema along the mons pubis and inferiorly.
Visualized portions of the perineum, perirectal region, and gluteal
soft tissues demonstrate no inflammatory changes. No organized fluid
collection identified. No free gas noted.

No suspicious lytic or blastic osseous lesions. No acute displaced
fracture. Multilevel degenerative changes of the spine.
IMPRESSION: 1. Subcutaneus soft tissue edema along the mons pubis and
inferiorly. Please note that the penis is only partially visualized
and the scrota are not imaged due to collimation off view. No
associated subcutaneus soft tissue emphysema or organized fluid
collection identified. Please note a necrotizing fasciitis cannot be
excluded as this is a clinical diagnosis.
2. Interval development of a 1.1 cm right middle lobe pulmonary
nodule. Consider one of the following in 3 months for both low-risk
and high-risk individuals: (a) repeat chest CT, (b) follow-up
PET-CT, or (c) tissue sampling. This recommendation follows the
consensus statement: Guidelines for Management of Incidental
Pulmonary Nodules Detected on CT Images: From the [HOSPITAL]

## 2023-06-03 NOTE — Progress Notes (Signed)
Seen by casting department

## 2023-09-01 ENCOUNTER — Emergency Department (HOSPITAL_BASED_OUTPATIENT_CLINIC_OR_DEPARTMENT_OTHER): Payer: 59

## 2023-09-01 ENCOUNTER — Other Ambulatory Visit: Payer: Self-pay

## 2023-09-01 ENCOUNTER — Emergency Department (HOSPITAL_BASED_OUTPATIENT_CLINIC_OR_DEPARTMENT_OTHER): Payer: 59 | Admitting: Radiology

## 2023-09-01 ENCOUNTER — Encounter (HOSPITAL_BASED_OUTPATIENT_CLINIC_OR_DEPARTMENT_OTHER): Payer: Self-pay | Admitting: Emergency Medicine

## 2023-09-01 ENCOUNTER — Emergency Department (HOSPITAL_BASED_OUTPATIENT_CLINIC_OR_DEPARTMENT_OTHER)
Admission: EM | Admit: 2023-09-01 | Discharge: 2023-09-01 | Disposition: A | Discharge status: Home / Self Care | Payer: 59 | Attending: Emergency Medicine | Admitting: Emergency Medicine

## 2023-09-01 DIAGNOSIS — M542 Cervicalgia: Secondary | ICD-10-CM | POA: Insufficient documentation

## 2023-09-01 DIAGNOSIS — S0101XA Laceration without foreign body of scalp, initial encounter: Secondary | ICD-10-CM | POA: Insufficient documentation

## 2023-09-01 DIAGNOSIS — W01198A Fall on same level from slipping, tripping and stumbling with subsequent striking against other object, initial encounter: Secondary | ICD-10-CM | POA: Diagnosis not present

## 2023-09-01 DIAGNOSIS — S40012A Contusion of left shoulder, initial encounter: Secondary | ICD-10-CM | POA: Insufficient documentation

## 2023-09-01 DIAGNOSIS — W19XXXA Unspecified fall, initial encounter: Secondary | ICD-10-CM

## 2023-09-01 MED ORDER — HYDROCODONE-ACETAMINOPHEN 5-325 MG PO TABS
1.0000 | ORAL_TABLET | Freq: Once | ORAL | Status: AC
  Administered 2023-09-01: 1 via ORAL
  Filled 2023-09-01: qty 1

## 2023-09-01 NOTE — ED Notes (Signed)
Patient transported to X-ray 

## 2023-09-01 NOTE — Discharge Instructions (Signed)
Return if any problems.

## 2023-09-01 NOTE — ED Provider Notes (Signed)
[place] EMERGENCY DEPARTMENT AT [facility] Provider Note   CSN: [id number] Arrival date & time: 09/01/23  1208     History  Chief Complaint  Patient presents with   Dustin Baker is a 52 y.o. male.  Patient reports he was walking in his garage last night and slipped and fell.  Patient reports he hit the back of his head and his left shoulder.  Patient is unsure if he lost consciousness.  Patient reports he has had a headache and experiencing some dizziness today.  Patient complains of some soreness in his elbow some soreness in his neck.  He reports he has been able to stand and walk.  He denies any visual changes denies any hearing changes.  The history is provided by the patient. No language interpreter was used.  Fall       Home Medications Prior to Admission medications   Medication Sig Start Date End Date Taking? Authorizing Provider  benzonatate (TESSALON) 200 MG capsule Take 1 capsule (200 mg total) by mouth 2 (two) times daily as needed for cough. 12/18/22   Karie Georges, MD  gabapentin (NEURONTIN) 300 MG capsule TAKE 1 CAPSULE BY MOUTH AT BEDTIME FOR 1 WEEK, 1 CAPSULE TWO TIMES A DAY FOR 1 WEEK, THEN 1 CAPSULE THREE TIMES A DAY 01/14/23   Karie Georges, MD  glipiZIDE (GLUCOTROL XL) 5 MG 24 hr tablet Take 1 tablet (5 mg total) by mouth daily with breakfast. 09/18/22   Karie Georges, MD  lisinopril (ZESTRIL) 2.5 MG tablet Take 1 tablet (2.5 mg total) by mouth daily. Reported on 02/13/2016 09/18/22   Karie Georges, MD  metFORMIN (GLUCOPHAGE) 1000 MG tablet Take 1 tablet (1,000 mg total) by mouth 2 (two) times daily with a meal. Start with 1/2 tablet twice a day for 1-2 weeks, then increase to 1 tablet twice a day. 09/18/22   Karie Georges, MD  pravastatin (PRAVACHOL) 40 MG tablet Take 1 tablet (40 mg total) by mouth daily. 09/18/22   Karie Georges, MD  sulfamethoxazole-trimethoprim (BACTRIM DS) 800-160 MG tablet Take 1 tablet  by mouth 2 (two) times daily. 09/05/22   Vivi Barrack, DPM  tirzepatide [facility]) 2.5 MG/0.5ML Pen Inject 2.5 mg into the skin once a week. 12/18/22   Karie Georges, MD      Allergies    Penicillins and Vancomycin    Review of Systems   Review of Systems  All other systems reviewed and are negative.   Physical Exam Updated Vital Signs BP (!) 145/82 (BP Location: Right Arm)   Pulse 73   Temp 98.5 F (36.9 C) (Oral)   Resp 16   SpO2 100%  Physical Exam Vitals and nursing note reviewed.  Constitutional:      Appearance: He is well-developed.  HENT:     Head: Normocephalic.     Comments: For facial laceration occipital scalp approximately 1.8 cm    Right Ear: External ear normal.     Left Ear: External ear normal.     Mouth/Throat:     Mouth: Mucous membranes are moist.  Neck:     Comments: Tender lower cervical spine and paravertebrals tender left trapezius Cardiovascular:     Rate and Rhythm: Normal rate.  Pulmonary:     Effort: Pulmonary effort is normal.  Abdominal:     General: Abdomen is flat. There is no distension.  Musculoskeletal:  General: Normal range of motion.     Cervical back: Normal range of motion.     Comments: Tender left shoulder pain with range of motion, neurovascular neurosensory intact  Skin:    General: Skin is warm.  Neurological:     General: No focal deficit present.     Mental Status: He is alert and oriented to person, place, and time.     ED Results / Procedures / Treatments   Labs (all labs ordered are listed, but only abnormal results are displayed) Labs Reviewed - No data to display  EKG None  Radiology DG Shoulder Left Result Date: 09/01/2023 CLINICAL DATA:  Injury.  Patient slipped on wet floor. EXAM: LEFT SHOULDER - 2+ VIEW COMPARISON:  None Available. FINDINGS: No acute fracture or dislocation. No aggressive osseous lesion. Glenohumeral and acromioclavicular joints are normal in alignment and exhibit mild  degenerative changes. No soft tissue swelling. No radiopaque foreign bodies. IMPRESSION: *No acute osseous abnormality of the left shoulder. Electronically Signed   By: Jules Schick M.D.   On: 09/01/2023 14:57   CT Cervical Spine Wo Contrast Result Date: 09/01/2023 CLINICAL DATA:  Neck trauma.  Mechanical fall last night. EXAM: CT CERVICAL SPINE WITHOUT CONTRAST TECHNIQUE: Multidetector CT imaging of the cervical spine was performed without intravenous contrast. Multiplanar CT image reconstructions were also generated. RADIATION DOSE REDUCTION: This exam was performed according to the departmental dose-optimization program which includes automated exposure control, adjustment of the mA and/or kV according to patient size and/or use of iterative reconstruction technique. COMPARISON:  CT of the cervical spine 12/10/2021 FINDINGS: Alignment: No significant listhesis is present. Cervical lordosis is preserved. Skull base and vertebrae: The craniocervical junction is normal. The vertebral body heights are normal. No acute fractures are present. Soft tissues and spinal canal: No prevertebral fluid or swelling. No visible canal hematoma. Disc levels: Moderate left foraminal narrowing at C3-4 and right foraminal narrowing at C4-5 secondary to uncovertebral disease. Asymmetric uncovertebral disease results in mild right foraminal narrowing at C5-6. No other focal disc protrusion or stenosis is present in the cervical spine. Upper chest: The lung apices are clear. The thoracic inlet is within normal limits. IMPRESSION: 1. No acute fracture or traumatic subluxation. 2. Moderate left foraminal narrowing at C3-4 and right foraminal narrowing at C4-5 secondary to uncovertebral disease. 3. Mild right foraminal narrowing at C5-6 secondary to uncovertebral disease. Electronically Signed   By: Marin Roberts M.D.   On: 09/01/2023 14:40   CT Head Wo Contrast Result Date: 09/01/2023 CLINICAL DATA:  Head trauma, moderate  to severe. Mechanical fall last night. Scalp laceration. Hit head on wall dizziness. EXAM: CT HEAD WITHOUT CONTRAST TECHNIQUE: Contiguous axial images were obtained from the base of the skull through the vertex without intravenous contrast. RADIATION DOSE REDUCTION: This exam was performed according to the departmental dose-optimization program which includes automated exposure control, adjustment of the mA and/or kV according to patient size and/or use of iterative reconstruction technique. COMPARISON:  CT head without contrast 12/10/2021 FINDINGS: Brain: No acute infarct, hemorrhage, or mass lesion is present. No significant white matter lesions are present. Deep brain nuclei are within normal limits. The ventricles are of normal size. No significant extraaxial fluid collection is present. The brainstem and cerebellum are within normal limits. Midline structures are within normal limits. Vascular: No hyperdense vessel or unexpected calcification. Skull: Left paramedian posterior scalp soft tissue swelling extends to the vertex. No underlying fracture or foreign body is present. No over laceration is present.  Sinuses/Orbits: Scattered ethmoid air cells are opacified bilaterally. Mild mucosal thickening is present in the inferior left frontal sinus. Minimal mucosal thickening is present in the maxillary sinuses bilaterally. Bilateral lens replacements are noted. Globes and orbits are otherwise unremarkable. IMPRESSION: 1. Left paramedian posterior scalp soft tissue swelling extends to the vertex. No underlying fracture or foreign body is present. 2. Normal CT appearance of the brain. 3. Mild paranasal sinus disease. Electronically Signed   By: Marin Roberts M.D.   On: 09/01/2023 14:38    Procedures Procedures    Medications Ordered in ED Medications  HYDROcodone-acetaminophen (NORCO/VICODIN) 5-325 MG per tablet 1 tablet (1 tablet Oral Given 09/01/23 1418)    ED Course/ Medical Decision Making/  A&P                                 Medical Decision Making Patient fell on a slick concrete garage floor yesterday struck the back of his head complains of head neck and shoulder discomfort  Amount and/or Complexity of Data Reviewed Independent Historian:     Details:  patient s here with family who is supportive Radiology: ordered and independent interpretation performed. Decision-making details documented in ED Course.    Details: Stray left shoulder shows no fracture Cervical spine shows chronic changes no acute CT head no acute changes  Risk Prescription drug management. Risk Details: Patient counseled on x-ray results he is advised ibuprofen.  He is counseled on signs and symptoms of head H injury and reasons to return.           Final Clinical Impression(s) / ED Diagnoses Final diagnoses:  Contusion of left shoulder, initial encounter  Laceration of occipital region of scalp, initial encounter  Fall, initial encounter    Rx / DC Orders ED Discharge Orders     None     An After Visit Summary was printed and given to the patient.     Elson Areas, [place] 09/01/23 1546    Ernie Avena, MD 09/02/23 1434

## 2023-09-01 NOTE — ED Notes (Signed)
He returns from imaging at this time. His family (two visitors) remain with him.

## 2023-09-01 NOTE — ED Triage Notes (Signed)
Last night around 11pm Slipped on wet floor Fall from standing, hit top of head on wall. Lac to top of head Reports concussion symptoms. "I think I blanked out for like a second" Denies n/v +dizziness  Pain in left shoulder

## 2023-12-29 ENCOUNTER — Encounter (HOSPITAL_COMMUNITY): Payer: Self-pay | Admitting: *Deleted

## 2023-12-29 ENCOUNTER — Inpatient Hospital Stay (HOSPITAL_COMMUNITY)
Admission: EM | Admit: 2023-12-29 | Discharge: 2024-01-03 | DRG: 617 | Disposition: A | Discharge status: Home / Self Care | Attending: Infectious Diseases | Admitting: Infectious Diseases

## 2023-12-29 ENCOUNTER — Other Ambulatory Visit: Payer: Self-pay

## 2023-12-29 DIAGNOSIS — L97529 Non-pressure chronic ulcer of other part of left foot with unspecified severity: Secondary | ICD-10-CM | POA: Diagnosis present

## 2023-12-29 DIAGNOSIS — L97519 Non-pressure chronic ulcer of other part of right foot with unspecified severity: Secondary | ICD-10-CM | POA: Diagnosis present

## 2023-12-29 DIAGNOSIS — Z833 Family history of diabetes mellitus: Secondary | ICD-10-CM | POA: Diagnosis not present

## 2023-12-29 DIAGNOSIS — E1140 Type 2 diabetes mellitus with diabetic neuropathy, unspecified: Secondary | ICD-10-CM | POA: Diagnosis present

## 2023-12-29 DIAGNOSIS — Z7985 Long-term (current) use of injectable non-insulin antidiabetic drugs: Secondary | ICD-10-CM | POA: Diagnosis not present

## 2023-12-29 DIAGNOSIS — E66812 Obesity, class 2: Secondary | ICD-10-CM | POA: Diagnosis present

## 2023-12-29 DIAGNOSIS — E1165 Type 2 diabetes mellitus with hyperglycemia: Secondary | ICD-10-CM | POA: Diagnosis present

## 2023-12-29 DIAGNOSIS — K219 Gastro-esophageal reflux disease without esophagitis: Secondary | ICD-10-CM | POA: Diagnosis present

## 2023-12-29 DIAGNOSIS — E1142 Type 2 diabetes mellitus with diabetic polyneuropathy: Secondary | ICD-10-CM | POA: Diagnosis not present

## 2023-12-29 DIAGNOSIS — E11621 Type 2 diabetes mellitus with foot ulcer: Secondary | ICD-10-CM | POA: Diagnosis present

## 2023-12-29 DIAGNOSIS — Z79899 Other long term (current) drug therapy: Secondary | ICD-10-CM

## 2023-12-29 DIAGNOSIS — E7800 Pure hypercholesterolemia, unspecified: Secondary | ICD-10-CM | POA: Diagnosis present

## 2023-12-29 DIAGNOSIS — I10 Essential (primary) hypertension: Secondary | ICD-10-CM | POA: Diagnosis present

## 2023-12-29 DIAGNOSIS — M86171 Other acute osteomyelitis, right ankle and foot: Secondary | ICD-10-CM | POA: Diagnosis not present

## 2023-12-29 DIAGNOSIS — Z881 Allergy status to other antibiotic agents status: Secondary | ICD-10-CM | POA: Diagnosis not present

## 2023-12-29 DIAGNOSIS — M869 Osteomyelitis, unspecified: Principal | ICD-10-CM | POA: Diagnosis present

## 2023-12-29 DIAGNOSIS — Z794 Long term (current) use of insulin: Secondary | ICD-10-CM

## 2023-12-29 DIAGNOSIS — Z7984 Long term (current) use of oral hypoglycemic drugs: Secondary | ICD-10-CM

## 2023-12-29 DIAGNOSIS — E1169 Type 2 diabetes mellitus with other specified complication: Secondary | ICD-10-CM | POA: Diagnosis not present

## 2023-12-29 DIAGNOSIS — L02611 Cutaneous abscess of right foot: Secondary | ICD-10-CM | POA: Diagnosis present

## 2023-12-29 DIAGNOSIS — Z6835 Body mass index (BMI) 35.0-35.9, adult: Secondary | ICD-10-CM | POA: Diagnosis not present

## 2023-12-29 DIAGNOSIS — Z880 Allergy status to penicillin: Secondary | ICD-10-CM

## 2023-12-29 DIAGNOSIS — Z818 Family history of other mental and behavioral disorders: Secondary | ICD-10-CM | POA: Diagnosis not present

## 2023-12-29 DIAGNOSIS — L97509 Non-pressure chronic ulcer of other part of unspecified foot with unspecified severity: Secondary | ICD-10-CM | POA: Diagnosis not present

## 2023-12-29 DIAGNOSIS — M86271 Subacute osteomyelitis, right ankle and foot: Secondary | ICD-10-CM | POA: Diagnosis not present

## 2023-12-29 LAB — COMPREHENSIVE METABOLIC PANEL WITH GFR
ALT: 11 U/L (ref 0–44)
AST: 16 U/L (ref 15–41)
Albumin: 3.1 g/dL — ABNORMAL LOW (ref 3.5–5.0)
Alkaline Phosphatase: 49 U/L (ref 38–126)
Anion gap: 12 (ref 5–15)
BUN: 14 mg/dL (ref 6–20)
CO2: 24 mmol/L (ref 22–32)
Calcium: 8.8 mg/dL — ABNORMAL LOW (ref 8.9–10.3)
Chloride: 97 mmol/L — ABNORMAL LOW (ref 98–111)
Creatinine, Ser: 0.98 mg/dL (ref 0.61–1.24)
GFR, Estimated: >60 mL/min (ref >60)
Glucose, Bld: 277 mg/dL — ABNORMAL HIGH (ref 70–99)
Potassium: 3.7 mmol/L (ref 3.5–5.1)
Sodium: 133 mmol/L — ABNORMAL LOW (ref 135–145)
Total Bilirubin: 0.4 mg/dL (ref 0.0–1.2)
Total Protein: 7.6 g/dL (ref 6.5–8.1)

## 2023-12-29 LAB — CBC
HCT: 37.3 % — ABNORMAL LOW (ref 39.0–52.0)
Hemoglobin: 12.1 g/dL — ABNORMAL LOW (ref 13.0–17.0)
MCH: 28.3 pg (ref 26.0–34.0)
MCHC: 32.4 g/dL (ref 30.0–36.0)
MCV: 87.4 fL (ref 80.0–100.0)
Platelets: 440 10*3/uL — ABNORMAL HIGH (ref 150–400)
RBC: 4.27 MIL/uL (ref 4.22–5.81)
RDW: 12.1 % (ref 11.5–15.5)
WBC: 6.8 10*3/uL (ref 4.0–10.5)
nRBC: 0 % (ref 0.0–0.2)

## 2023-12-29 LAB — URINALYSIS, ROUTINE W REFLEX MICROSCOPIC
Bacteria, UA: NONE SEEN
Bilirubin Urine: NEGATIVE
Glucose, UA: >=500 mg/dL — AB
Ketones, ur: 5 mg/dL — AB
Leukocytes,Ua: NEGATIVE
Nitrite: NEGATIVE
Protein, ur: 100 mg/dL — AB
Specific Gravity, Urine: 1.026 (ref 1.005–1.030)
pH: 5 (ref 5.0–8.0)

## 2023-12-29 LAB — I-STAT CG4 LACTIC ACID, ED: Lactic Acid, Venous: 2.7 mmol/L (ref 0.5–1.9)

## 2023-12-29 LAB — LIPASE, BLOOD: Lipase: 38 U/L (ref 11–51)

## 2023-12-29 LAB — GLUCOSE, CAPILLARY: Glucose-Capillary: 251 mg/dL — ABNORMAL HIGH (ref 70–99)

## 2023-12-29 MED ORDER — HEPARIN SODIUM (PORCINE) 5000 UNIT/ML IJ SOLN
5000.0000 [IU] | Freq: Three times a day (TID) | INTRAMUSCULAR | Status: DC
  Administered 2023-12-29 – 2024-01-03 (×12): 5000 [IU] via SUBCUTANEOUS
  Filled 2023-12-29 (×13): qty 1

## 2023-12-29 MED ORDER — AMLODIPINE BESYLATE 5 MG PO TABS
5.0000 mg | ORAL_TABLET | Freq: Every day | ORAL | Status: DC
  Administered 2023-12-30 – 2024-01-03 (×5): 5 mg via ORAL
  Filled 2023-12-29 (×5): qty 1

## 2023-12-29 MED ORDER — INSULIN GLARGINE-YFGN 100 UNIT/ML ~~LOC~~ SOLN
20.0000 [IU] | Freq: Every day | SUBCUTANEOUS | Status: DC
  Administered 2023-12-29 – 2024-01-02 (×5): 20 [IU] via SUBCUTANEOUS
  Filled 2023-12-29 (×8): qty 0.2

## 2023-12-29 MED ORDER — INSULIN ASPART 100 UNIT/ML IJ SOLN
0.0000 [IU] | Freq: Every day | INTRAMUSCULAR | Status: DC
  Administered 2023-12-29: 3 [IU] via SUBCUTANEOUS
  Administered 2023-12-30 – 2024-01-01 (×2): 2 [IU] via SUBCUTANEOUS
  Administered 2024-01-02: 3 [IU] via SUBCUTANEOUS

## 2023-12-29 MED ORDER — INSULIN ASPART 100 UNIT/ML IJ SOLN
0.0000 [IU] | Freq: Three times a day (TID) | INTRAMUSCULAR | Status: DC
  Administered 2023-12-30 (×2): 3 [IU] via SUBCUTANEOUS
  Administered 2023-12-30: 2 [IU] via SUBCUTANEOUS
  Administered 2023-12-31: 3 [IU] via SUBCUTANEOUS
  Administered 2023-12-31: 2 [IU] via SUBCUTANEOUS
  Administered 2023-12-31 – 2024-01-02 (×5): 3 [IU] via SUBCUTANEOUS
  Administered 2024-01-02: 5 [IU] via SUBCUTANEOUS
  Administered 2024-01-03: 8 [IU] via SUBCUTANEOUS
  Administered 2024-01-03: 5 [IU] via SUBCUTANEOUS

## 2023-12-29 MED ORDER — METRONIDAZOLE 500 MG/100ML IV SOLN
500.0000 mg | Freq: Two times a day (BID) | INTRAVENOUS | Status: DC
  Administered 2023-12-29 – 2023-12-31 (×4): 500 mg via INTRAVENOUS
  Filled 2023-12-29 (×4): qty 100

## 2023-12-29 MED ORDER — SODIUM CHLORIDE 0.9 % IV SOLN
2.0000 g | Freq: Three times a day (TID) | INTRAVENOUS | Status: DC
  Administered 2023-12-29 – 2023-12-31 (×5): 2 g via INTRAVENOUS
  Filled 2023-12-29 (×6): qty 12.5

## 2023-12-29 MED ORDER — ATORVASTATIN CALCIUM 40 MG PO TABS
40.0000 mg | ORAL_TABLET | Freq: Every day | ORAL | Status: DC
  Administered 2023-12-29 – 2024-01-03 (×6): 40 mg via ORAL
  Filled 2023-12-29 (×6): qty 1

## 2023-12-29 MED ORDER — LINEZOLID 600 MG/300ML IV SOLN
600.0000 mg | Freq: Two times a day (BID) | INTRAVENOUS | Status: DC
  Administered 2023-12-29 – 2023-12-31 (×4): 600 mg via INTRAVENOUS
  Filled 2023-12-29 (×5): qty 300

## 2023-12-29 NOTE — ED Provider Notes (Addendum)
 [place] EMERGENCY DEPARTMENT AT [facility] Provider Note   CSN: [id number] Arrival date & time: 12/29/23  1525     History  Chief Complaint  Patient presents with   Recurrent Skin Infections    Dustin Baker is a 53 y.o. male.  53 year old male presents today for concern of right foot wound and concern for osteomyelitis.  Recently had an MRI done which showed evidence of osteomyelitis.  He was recommended to come in at the direction of his wound specialist. He states the wound initially started 2 months ago with an abscess and since then it has not healed.  He is a diabetic.  Diabetes is poorly controlled.  The history is provided by the patient. No language interpreter was used.       Home Medications Prior to Admission medications   Medication Sig Start Date End Date Taking? Authorizing Provider  benzonatate  (TESSALON ) 200 MG capsule Take 1 capsule (200 mg total) by mouth 2 (two) times daily as needed for cough. 12/18/22   Aida House, MD  gabapentin  (NEURONTIN ) 300 MG capsule TAKE 1 CAPSULE BY MOUTH AT BEDTIME FOR 1 WEEK, 1 CAPSULE TWO TIMES A DAY FOR 1 WEEK, THEN 1 CAPSULE THREE TIMES A DAY 01/14/23   Aida House, MD  glipiZIDE  (GLUCOTROL  XL) 5 MG 24 hr tablet Take 1 tablet (5 mg total) by mouth daily with breakfast. 09/18/22   Aida House, MD  lisinopril  (ZESTRIL ) 2.5 MG tablet Take 1 tablet (2.5 mg total) by mouth daily. Reported on 02/13/2016 09/18/22   Aida House, MD  metFORMIN  (GLUCOPHAGE ) 1000 MG tablet Take 1 tablet (1,000 mg total) by mouth 2 (two) times daily with a meal. Start with 1/2 tablet twice a day for 1-2 weeks, then increase to 1 tablet twice a day. 09/18/22   Aida House, MD  pravastatin  (PRAVACHOL ) 40 MG tablet Take 1 tablet (40 mg total) by mouth daily. 09/18/22   Aida House, MD  sulfamethoxazole -trimethoprim  (BACTRIM  DS) 800-160 MG tablet Take 1 tablet by mouth 2 (two) times daily. 09/05/22   Charity Conch, DPM  tirzepatide  (MOUNJARO ) 2.5 MG/0.5ML Pen Inject 2.5 mg into the skin once a week. 12/18/22   Aida House, MD      Allergies    Penicillins and Vancomycin     Review of Systems   Review of Systems  Constitutional:  Negative for chills and fever.  Skin:  Positive for wound.  All other systems reviewed and are negative.   Physical Exam Updated Vital Signs BP 129/88   Pulse (!) 103   Temp 98.8 F (37.1 C)   Resp 16   Ht 5\' 9"  (1.753 m)   Wt 109.5 kg   SpO2 98%   BMI 35.65 kg/m  Physical Exam Vitals and nursing note reviewed.  Constitutional:      General: He is not in acute distress.    Appearance: Normal appearance. He is not ill-appearing.  HENT:     Head: Normocephalic and atraumatic.     Nose: Nose normal.  Eyes:     Conjunctiva/sclera: Conjunctivae normal.  Cardiovascular:     Rate and Rhythm: Tachycardia present.  Pulmonary:     Effort: Pulmonary effort is normal. No respiratory distress.  Musculoskeletal:        General: No deformity. Normal range of motion.     Comments: Dressing and Unna boot in place on the right foot.  Skin:    Findings:  No rash.  Neurological:     Mental Status: He is alert.     ED Results / Procedures / Treatments   Labs (all labs ordered are listed, but only abnormal results are displayed) Labs Reviewed  COMPREHENSIVE METABOLIC PANEL WITH GFR - Abnormal; Notable for the following components:      Result Value   Sodium 133 (*)    Chloride 97 (*)    Glucose, Bld 277 (*)    Calcium  8.8 (*)    Albumin 3.1 (*)    All other components within normal limits  CBC - Abnormal; Notable for the following components:   Hemoglobin 12.1 (*)    HCT 37.3 (*)    Platelets 440 (*)    All other components within normal limits  URINALYSIS, ROUTINE W REFLEX MICROSCOPIC - Abnormal; Notable for the following components:   Color, Urine AMBER (*)    APPearance HAZY (*)    Glucose, UA >=500 (*)    Hgb urine dipstick SMALL (*)     Ketones, ur 5 (*)    Protein, ur 100 (*)    All other components within normal limits  LIPASE, BLOOD  I-STAT CG4 LACTIC ACID, ED    EKG None  Radiology No results found.  Procedures Procedures    Medications Ordered in ED Medications - No data to display  ED Course/ Medical Decision Making/ A&P                                 Medical Decision Making Risk Prescription drug management. Decision regarding hospitalization.   53 year old male presents today for concern of osteomyelitis to the right foot. MRI reviewed which was done and outside network.  Results attached below. Linezolid  ordered.  Patient has vancomycin  allergy. Will notify Ortho. Discussed with internal medicine service.  They will evaluate patient for admission. Patient is in agreement with the plan. Patient does have good outpatient follow-up in terms of his social determinants of health. CBC without leukocytosis.  Minimal anemia.  CMP with preserved renal function, glucose of 277 otherwise without acute concern.  UA without acute concern.  Discussed with Dr. Deeann Fare of the orthopedist who will consult.  12/23/23 MRI IMPRESSION: 1. Soft tissue ulceration at the lateral forefoot, overlying the distal fifth metatarsal shaft, tracts deep into the underlying soft tissues with an area of 2.7 x 1.7 x 1.3 cm heterogenous T2 hyperintense signal with peripheral enhancement, which could represent packing material within a sinus tract, however, underlying abscess can not be excluded. This extends through and along the plantar margin of a new defect/fracture of the fifth metatarsal head with medial and dorsal displacement. There is marrow signal abnormality with associated enhancement of the fifth metatarsal and the fifth proximal phalanx, compatible with osteomyelitis. 2. Subcutaneous edema and enhancement extending along the dorsal foot, concerning for cellulitis.    Final Clinical Impression(s) / ED  Diagnoses Final diagnoses:  Osteomyelitis of right foot, unspecified type [facility])    Rx / DC Orders ED Discharge Orders     None         Lucina Sabal, PA-C 12/29/23 1712    Lucina Sabal, PA-C 12/29/23 1813    Afton Horse T, DO 01/07/24 [phone]

## 2023-12-29 NOTE — ED Notes (Signed)
 Lactic acid was ordered by a pa after the  initial blood draw not stuck a 2nd time

## 2023-12-29 NOTE — ED Triage Notes (Signed)
 The pt has had a wound onto his rt foot for 2 months he had  a mri Monday and he was told that his rt foot is infected and that he needed antibiotics

## 2023-12-29 NOTE — Hospital Course (Addendum)
#  Diabetic Foot Ulcer of Right Foot #Concern for Osteomyelitis #Possible cellulitis Initially treated in the outpatient setting.  Patient noted improvement during this time with no associated fever, chills, night sweats, or other signs or symptoms.  MRI during his visit prior to presentation demonstrated a soft tissue ulceration of the lateral forefoot with heterogenous T2 hyperintense signal with peripheral enhancement measuring approximately 2.7 x 1.7 x 1.3 cm, which could represent packing material within a sinus tract; however, underlying abscess could not be excluded. There was marrow signal abnormality with associated enhancement of the fifth metatarsal and the fifth proximal phalanx, compatible with osteomyelitis.  There was also some subcutaneous edema and enhancement concerning for cellulitis.  No leukocytosis on his CBC (white blood cell count equal 6.8).  He was started on linezolid  rather than vancomycin  given his vancomycin  allergy. Broadened coverage with cefepime  for Pseudomonas coverage and Flagyl  for anaerobic coverage. Cefepime  narrowed to ceftriaxone . Will continue linezolid  for 2 days outpatient.   #T2DM #Diabetic Neuropathy Poorly controlled. Last A1c in 11/2023 elevated at 12.2. Current medications include Lantus  20 units daily and metformin  500 mg/day. Would consider resuming gabapentin  given history of neuropathy.  Continued on home Lantus  and sliding scale insulin .  Will resume metformin  outpatient.   #HTN Continued on home amlodipine  5 mg/day. Would recommend starting ACE inhibitor and SGLT2 inhibitor for improved renal protection in the outpatient setting.   #HLD  Resume atorvastatin  40 mg daily.  Follow-up outpatient

## 2023-12-29 NOTE — H&P (Cosign Needed Addendum)
 Date: 12/29/2023               Patient Name:  Dustin Baker MRN: [id number]  DOB: [birth year] Age / Sex: 53 y.o., male   PCP: Default, Provider, MD         Medical Service: Internal Medicine Teaching Service         Attending Physician: Dr. Cherylene Corrente, MD      First Contact: Maxie Spaniel, MD     Second Contact: Chrissie Coupe, MD          After Hours (After 5p/  First Contact Pager: [phone]  weekends / holidays): Second Contact Pager: [phone]   SUBJECTIVE   Chief Complaint: Diabetic ulcer  History of Present Illness: Dustin Baker is a 53 YO M with a history of poor controlled T2DM, HTN, and HLD presenting to the ED and being admitted with concerns for osteomyelitis.  Patient notes that roughly 2 months ago, he developed an ulcer on the lateral component of his right fifth toe.  This was treated in the outpatient setting with antibiotics with improvement.  During this time, he denies any fever, chills, night sweats, or other signs or symptoms.  He went to his visit on 4/24.  He got an MRI of his right foot.  The MRI demonstrated concerns for osteomyelitis, so he was instructed to follow-up in the ED for further management.  ED Course: Na 133 WBC 6.8 Hemoglobin 12.1 Started on linezolid (vancomycin  allergy)  Meds:  Amlodipine 5 mg/day Lantus  20 units/day Metformin  500 mg/day  Past Medical History T2DM HTN  HLD   Past Surgical History:  Procedure Laterality Date   CATARACT EXTRACTION, BILATERAL     none     ORIF MANDIBULAR FRACTURE N/A 12/10/2021   Procedure: OPEN REDUCTION INTERNAL FIXATION (ORIF) MANDIBULAR FRACTURE;  Surgeon: Vernadine Golas, MD;  Location: [facility] OR;  Service: ENT;  Laterality: N/A;   VASECTOMY     Social:  Lives With: Son in [place] Level of Function: Independent of ADLs and IADLs PCP: [place], NP Substances: Denies any history of alcohol use, tobacco use, or recreational drug use  Family History Family History  Problem Relation  Age of Onset   Depression Mother    Diabetes Father    Diabetes Paternal Grandfather     Allergies: Allergies as of 12/29/2023 - Review Complete 12/29/2023  Allergen Reaction Noted   Penicillins Other (See Comments) 11/11/2013   Vancomycin  Itching 02/23/2021    Review of Systems: A complete ROS was negative except as per HPI.   OBJECTIVE:   Physical Exam: Blood pressure 129/88, pulse (!) 103, temperature 98.8 F (37.1 C), resp. rate 16, height 5\' 9"  (1.753 m), weight 109.5 kg, SpO2 98%.  Constitutional: well-appearing sitting in no acute distress HENT: normocephalic atraumatic, mucous membranes moist Eyes: conjunctiva non-erythematous Neck: supple Cardiovascular: regular rate and rhythm, no m/r/g Pulmonary/Chest: normal work of breathing on room air, lungs clear to auscultation bilaterally; difficult to palpate pulses in lower extremity Abdominal: soft, non-tender, non-distended MSK: Ulcer on lateral component of right fifth toe with associated swelling and erythema; nontender with no purulence Neurological: alert & oriented x 3, 5/5 strength in bilateral upper and lower extremities, normal gait Skin: warm and dry  Labs: CBC    Component Value Date/Time   WBC 6.8 12/29/2023 1540   RBC 4.27 12/29/2023 1540   HGB 12.1 (L) 12/29/2023 1540   HCT 37.3 (L) 12/29/2023 1540   PLT 440 (H) 12/29/2023 1540  MCV 87.4 12/29/2023 1540   MCV 87.6 02/13/2016 1052   MCH 28.3 12/29/2023 1540   MCHC 32.4 12/29/2023 1540   RDW 12.1 12/29/2023 1540   LYMPHSABS 2.6 12/10/2021 0306   MONOABS 0.6 12/10/2021 0306   EOSABS 0.3 12/10/2021 0306   BASOSABS 0.1 12/10/2021 0306     CMP     Component Value Date/Time   NA 133 (L) 12/29/2023 1540   K 3.7 12/29/2023 1540   CL 97 (L) 12/29/2023 1540   CO2 24 12/29/2023 1540   GLUCOSE 277 (H) 12/29/2023 1540   BUN 14 12/29/2023 1540   CREATININE 0.98 12/29/2023 1540   CREATININE 0.78 02/13/2016 1040   CALCIUM 8.8 (L) 12/29/2023 1540    PROT 7.6 12/29/2023 1540   ALBUMIN 3.1 (L) 12/29/2023 1540   AST 16 12/29/2023 1540   ALT 11 12/29/2023 1540   ALKPHOS 49 12/29/2023 1540   BILITOT 0.4 12/29/2023 1540   GFRNONAA >60 12/29/2023 1540   GFRNONAA >89 02/13/2016 1040   GFRAA >60 10/18/2016 0353   GFRAA >89 02/13/2016 1040    Imaging: None  EKG: None  ASSESSMENT & PLAN:   Assessment & Plan by Problem: Principal Problem:   Osteomyelitis of right foot (HCC)  Dustin Baker is a 53 YO M with a history of poor controlled T2DM, HTN, and HLD presenting to the ED and being admitted with concerns for osteomyelitis.  #Diabetic Foot Ulcer of Right Foot #Concern for Osteomyelitis #Possible cellulitis Ongoing issue for the past 2 months that has been treated in the outpatient setting.  Patient noted improvement during this time with no associated fever, chills, night sweats, or other signs or symptoms.  MRI during his visit last week demonstrated a soft tissue ulceration of the lateral forefoot with  heterogenous T2 hyperintense signal with peripheral enhancement measuring approximately 2.7 x 1.7 x 1.3 cm, which could represent packing material within a sinus tract; however, underlying abscess could not be excluded. There was marrow signal abnormality with associated enhancement of the fifth metatarsal and the fifth proximal phalanx, compatible with osteomyelitis.  There was also some subcutaneous edema and enhancement concerning for cellulitis.  No leukocytosis on his CBC (white blood cell count equal 6.8).  He was started on linezolid rather than vancomycin  given his vancomycin  allergy.  Will broaden coverage with cefepime for Pseudomonas coverage and Flagyl  for anaerobic coverage. On assessment, difficult to palpate pulses so we will follow-up with ABI. Will also consult wound care for management of ulcer.  - Continue linezolid, cefepime, and Flagyl  - Follow-up ABIs - Follow-up lactic acid - Follow-up Ortho recs - Trend fever curve  and CBC - Follow-up PT/OT  #T2DM #Diabetic Neuropathy Poorly controlled. Last A1c in 11/2023 elevated at 12.2. Current medications include Lantus  20 units daily and metformin  500 mg/day. Would consider resuming gabapentin  given history of neuropathy.  - Continue Lantus  with sliding scale insulin  - Hold metformin   #HTN Current medication includes amlodipine 5 mg daily.  Would recommend starting ACE inhibitor and SGLT2 inhibitor for improved renal protection in the outpatient setting. - Continue home amlodipine 5 mg/day  #HLD  Previously on pravastatin  but no longer taking.  Unclear why given his LDL of 146 and ASCVD risk of 11.5%, so we will begin atorvastatin 40 mg/day.  - Begin atorvastatin 40 mg/day  Diet: Carb modified VTE: Heparin Code: Full  Prior to Admission Living Arrangement: Home, living with son Anticipated Discharge Location: Home Barriers to Discharge: IV antibiotics  Dispo: Admit patient to Inpatient  with expected length of stay greater than 2 midnights.  Signed: Maxie Spaniel, MD Internal Medicine Resident PGY-1  12/29/2023, 6:11 PM

## 2023-12-29 NOTE — Consult Note (Signed)
 Reason for Consult:left foot ulcer Referring Physician:   BRINLEY LEINER Baker is an 53 y.o. male.  Dustin Baker is a 53 YO M with a history of poor controlled T2DM, HTN, and HLD presenting to the ED and being admitted with concerns for osteomyelitis.  Dustin Baker notes that roughly 2 months ago, he developed an ulcer on the lateral component of his right fifth toe.  This was treated in the outpatient setting with antibiotics with improvement.  During this time, he denies any fever, chills, night sweats, or other signs or symptoms.  He went to his visit on 4/24.  He got an MRI of his right foot.  The MRI demonstrated concerns for osteomyelitis, so he was instructed to follow-up in the ED for further management.   Past Medical History:  Diagnosis Date   Allergy    Diabetes mellitus without complication (HCC)    Diabetes mellitus, type II (HCC)    GERD (gastroesophageal reflux disease)    High cholesterol    Hives of unknown origin    Hypertension    Neuropathy    Neuropathy     Past Surgical History:  Procedure Laterality Date   CATARACT EXTRACTION, BILATERAL     none     ORIF MANDIBULAR FRACTURE N/A 12/10/2021   Procedure: OPEN REDUCTION INTERNAL FIXATION (ORIF) MANDIBULAR FRACTURE;  Surgeon: Vernadine Golas, MD;  Location: [facility] OR;  Service: ENT;  Laterality: N/A;   VASECTOMY      Family History  Problem Relation Age of Onset   Depression Mother    Diabetes Father    Diabetes Paternal Grandfather     Social History:  reports that he has never smoked. He has never used smokeless tobacco. He reports current alcohol use. He reports that he does not use drugs.  Allergies:  Allergies  Allergen Reactions   Penicillins Other (See Comments)    Childhood reaction   Vancomycin  Itching    Redness and itching after infusion - infuse as slower rate     Medications: I have reviewed the Dustin Baker's current medications.  Results for orders placed or performed during the hospital encounter of  12/29/23 (from the past 48 hours)  Lipase, blood     Status: None   Collection Time: 12/29/23  3:40 PM  Result Value Ref Range   Lipase 38 11 - 51 U/L    Comment: Performed at [facility], [address], [place], [place] [address]  Comprehensive metabolic panel     Status: Abnormal   Collection Time: 12/29/23  3:40 PM  Result Value Ref Range   Sodium 133 (L) 135 - 145 mmol/L   Potassium 3.7 3.5 - 5.1 mmol/L   Chloride 97 (L) 98 - 111 mmol/L   CO2 24 22 - 32 mmol/L   Glucose, Bld 277 (H) 70 - 99 mg/dL    Comment: Glucose reference range applies only to samples taken after fasting for at least 8 hours.   BUN 14 6 - 20 mg/dL   Creatinine, Ser 6.04 0.61 - 1.24 mg/dL   Calcium 8.8 (L) 8.9 - 10.3 mg/dL   Total Protein 7.6 6.5 - 8.1 g/dL   Albumin 3.1 (L) 3.5 - 5.0 g/dL   AST 16 15 - 41 U/L   ALT 11 0 - 44 U/L   Alkaline Phosphatase 49 38 - 126 U/L   Total Bilirubin 0.4 0.0 - 1.2 mg/dL   GFR, Estimated >54 >09 mL/min    Comment: (NOTE) Calculated using  the CKD-EPI Creatinine Equation (2021)    Anion gap 12 5 - 15    Comment: Performed at [facility], [address], [place], [place] [address]  CBC     Status: Abnormal   Collection Time: 12/29/23  3:40 PM  Result Value Ref Range   WBC 6.8 4.0 - 10.5 K/uL   RBC 4.27 4.22 - 5.81 MIL/uL   Hemoglobin 12.1 (L) 13.0 - 17.0 g/dL   HCT 60.4 (L) 54.0 - 98.1 %   MCV 87.4 80.0 - 100.0 fL   MCH 28.3 26.0 - 34.0 pg   MCHC 32.4 30.0 - 36.0 g/dL   RDW 19.1 47.8 - 29.5 %   Platelets 440 (H) 150 - 400 K/uL   nRBC 0.0 0.0 - 0.2 %    Comment: Performed at [facility], [address] [place], [place] [address]  Urinalysis, Routine w reflex microscopic -Urine, Clean Catch     Status: Abnormal   Collection Time: 12/29/23  3:40 PM  Result Value Ref Range   Color, Urine [place] (A) YELLOW    Comment: BIOCHEMICALS MAY BE AFFECTED BY COLOR   APPearance HAZY (A) CLEAR   Specific Gravity, Urine 1.026 1.005 - 1.030   pH 5.0 5.0 -  8.0   Glucose, UA >=500 (A) NEGATIVE mg/dL   Hgb urine dipstick SMALL (A) NEGATIVE   Bilirubin Urine NEGATIVE NEGATIVE   Ketones, ur 5 (A) NEGATIVE mg/dL   Protein, ur 865 (A) NEGATIVE mg/dL   Nitrite NEGATIVE NEGATIVE   Leukocytes,Ua NEGATIVE NEGATIVE   RBC / HPF 0-5 0 - 5 RBC/hpf   WBC, UA 0-5 0 - 5 WBC/hpf   Bacteria, UA NONE SEEN NONE SEEN   Squamous Epithelial / HPF 0-5 0 - 5 /HPF   Mucus PRESENT    Hyaline Casts, UA PRESENT     Comment: Performed at [facility], [address] [place], [place] [address]  I-Stat Lactic Acid     Status: Abnormal   Collection Time: 12/29/23  5:53 PM  Result Value Ref Range   Lactic Acid, Venous 2.7 (HH) 0.5 - 1.9 mmol/L   Comment MD NOTIFIED, SUGGEST RECOLLECT     No results found.  Review of Systems  Constitutional:  Negative for chills and fatigue.  Respiratory:  Negative for cough.   Cardiovascular:  Negative for chest pain and palpitations.  Musculoskeletal:  Positive for arthralgias and joint swelling.  Skin:  Positive for wound.  Neurological:  Positive for numbness. Negative for dizziness.  Psychiatric/Behavioral:  Negative for behavioral problems and confusion.    Blood pressure 129/88, pulse (!) 103, temperature 98.8 F (37.1 C), resp. rate 16, height 5\' 9"  (1.753 m), weight 109.5 kg, SpO2 98%. Physical Exam Constitutional:      General: He is not in acute distress.    Appearance: Normal appearance. He is normal weight. He is not ill-appearing.  HENT:     Head: Normocephalic and atraumatic.  Eyes:     Extraocular Movements: Extraocular movements intact.  Cardiovascular:     Rate and Rhythm: Normal rate.     Pulses: Normal pulses.  Pulmonary:     Effort: Pulmonary effort is normal. No respiratory distress.  Musculoskeletal:     Cervical back: Normal range of motion.     Comments: 2cmx1cm ulcer on lateral component of right fifth toe/metarsal with associated swelling and erythema; nontender with no purulence   Skin:    Capillary Refill: Capillary refill takes less  than 2 seconds.  Neurological:     General: No focal deficit present.     Mental Status: He is alert and oriented to person, place, and time.     Assessment/Plan: Left foot diabetic foot ulcer with osteomyelitis [place] admitted under medicine for antibiotic management. Dustin Baker will likely need surgical intervention for the left foot ulcer, more likely ray amputation. Will have Dr Hulda Mage or Dr Julio Ohm consult with the [place] tomorrow to discuss treatment options. Dustin Baker does not need to be NPO after midnight at this point.   Anahis Furgeson L. [place], PA-C 12/29/2023, 6:39 PM

## 2023-12-30 ENCOUNTER — Encounter (HOSPITAL_COMMUNITY)

## 2023-12-30 DIAGNOSIS — M869 Osteomyelitis, unspecified: Secondary | ICD-10-CM

## 2023-12-30 DIAGNOSIS — E1142 Type 2 diabetes mellitus with diabetic polyneuropathy: Secondary | ICD-10-CM | POA: Diagnosis not present

## 2023-12-30 DIAGNOSIS — M86171 Other acute osteomyelitis, right ankle and foot: Secondary | ICD-10-CM | POA: Diagnosis not present

## 2023-12-30 DIAGNOSIS — E1140 Type 2 diabetes mellitus with diabetic neuropathy, unspecified: Secondary | ICD-10-CM | POA: Diagnosis not present

## 2023-12-30 DIAGNOSIS — M86271 Subacute osteomyelitis, right ankle and foot: Secondary | ICD-10-CM

## 2023-12-30 DIAGNOSIS — E1169 Type 2 diabetes mellitus with other specified complication: Secondary | ICD-10-CM | POA: Diagnosis not present

## 2023-12-30 LAB — LACTIC ACID, PLASMA
Lactic Acid, Venous: 1.6 mmol/L (ref 0.5–1.9)
Lactic Acid, Venous: 1.1 mmol/L (ref 0.5–1.9)

## 2023-12-30 LAB — GLUCOSE, CAPILLARY
Glucose-Capillary: 161 mg/dL — ABNORMAL HIGH (ref 70–99)
Glucose-Capillary: 157 mg/dL — ABNORMAL HIGH (ref 70–99)
Glucose-Capillary: 137 mg/dL — ABNORMAL HIGH (ref 70–99)
Glucose-Capillary: 248 mg/dL — ABNORMAL HIGH (ref 70–99)

## 2023-12-30 LAB — BASIC METABOLIC PANEL WITH GFR
Anion gap: 11 (ref 5–15)
BUN: 12 mg/dL (ref 6–20)
CO2: 25 mmol/L (ref 22–32)
Calcium: 8.7 mg/dL — ABNORMAL LOW (ref 8.9–10.3)
Chloride: 100 mmol/L (ref 98–111)
Creatinine, Ser: 0.9 mg/dL (ref 0.61–1.24)
GFR, Estimated: >60 mL/min (ref >60)
Glucose, Bld: 140 mg/dL — ABNORMAL HIGH (ref 70–99)
Potassium: 3.8 mmol/L (ref 3.5–5.1)
Sodium: 136 mmol/L (ref 135–145)

## 2023-12-30 LAB — CBC
HCT: 32.5 % — ABNORMAL LOW (ref 39.0–52.0)
Hemoglobin: 10.6 g/dL — ABNORMAL LOW (ref 13.0–17.0)
MCH: 28 pg (ref 26.0–34.0)
MCHC: 32.6 g/dL (ref 30.0–36.0)
MCV: 86 fL (ref 80.0–100.0)
Platelets: 371 10*3/uL (ref 150–400)
RBC: 3.78 MIL/uL — ABNORMAL LOW (ref 4.22–5.81)
RDW: 12.1 % (ref 11.5–15.5)
WBC: 6.9 10*3/uL (ref 4.0–10.5)
nRBC: 0 % (ref 0.0–0.2)

## 2023-12-30 LAB — HIV ANTIBODY (ROUTINE TESTING W REFLEX): HIV Screen 4th Generation wRfx: NONREACTIVE

## 2023-12-30 MED ORDER — ACETAMINOPHEN 325 MG PO TABS
650.0000 mg | ORAL_TABLET | Freq: Four times a day (QID) | ORAL | Status: DC | PRN
  Administered 2023-12-30: 650 mg via ORAL
  Filled 2023-12-30: qty 2

## 2023-12-30 NOTE — Progress Notes (Signed)
 PT Cancellation Note  Patient Details Name: Dustin Baker MRN: [id number] DOB: 10-26-1970   Cancelled Treatment:    Reason Eval/Treat Not Completed: PT screened, no needs identified, will sign off. Pt currently independent with mobility per OT. If has surgery may need PT post op. Please re-order if needed.   Pura Browns [facility] 12/30/2023, 9:06 AM Angelina Kempf PT Acute [employer] [phone]

## 2023-12-30 NOTE — Plan of Care (Signed)

## 2023-12-30 NOTE — Consult Note (Signed)
 WOC Nurse Consult Note: patient with R foot wound x 2 months, followed at [facility] last seen 12/26/2023 with orders for Vashe moist to dry dressing changes daily; has been evaluated by ortho here with plans for Dr. Julio Ohm to see Monday 12/31/2023  Patient has had MRI with concerns for osteomyelitis  Reason for Consult: R diabetic foot wound  Wound type: full thickness R foot  Pressure Injury POA: not related to pressure  Measurement: per ortho note 2 cm x 1 cm  Wound bed: 100% red  Drainage (amount, consistency, odor) drainage not purulent per ortho note  Periwound: edema, erythema  Dressing procedure/placement/frequency: Cleanse R foot wound with Vashe wound cleanser [employer] [phone]), do not rinse and allow to air dry. Apply a small piece of Vashe moistened gauze to wound bed daily, cover with dry gauze and secure with Kerlix roll gauze.    Patient to be evaluated by Dr. Julio Ohm. Any wound care orders placed by Dr. Julio Ohm supercede wound care orders placed by [facility] nurse.    WOC team will not follow. Re-consult if further needs arise.   Thank you,    Ronni Colace MSN, RN-BC, [employer] [phone]

## 2023-12-30 NOTE — Progress Notes (Signed)
 HD#1 SUBJECTIVE:  Patient Summary:  Dustin Baker is a 53 YO M with a history of poor controlled T2DM, HTN, and HLD presenting to the ED and being admitted with concerns for osteomyelitis.    Overnight Events: NAEO  Interim History: Patient was evaluated at bedside.   OBJECTIVE:  Vital Signs: Vitals:   12/29/23 1536 12/29/23 1800 12/30/23 0011 12/30/23 0459  BP:  (!) 130/90 119/83 132/80  Pulse:   82 78  Resp:   20 19  Temp:  98.4 F (36.9 C) 98.3 F (36.8 C) 97.8 F (36.6 C)  TempSrc:  Oral Oral Oral  SpO2:   98% 100%  Weight: 109.5 kg     Height: 5\' 9"  (1.753 m)      Supplemental O2: Room Air SpO2: 100 %  Filed Weights   12/29/23 1536  Weight: 109.5 kg     Intake/Output Summary (Last 24 hours) at 12/30/2023 0739 Last data filed at 12/30/2023 5409 Gross per 24 hour  Intake 600 ml  Output --  Net 600 ml   Net IO Since Admission: 600 mL [12/30/23 0739]  CBC    Component Value Date/Time   WBC 6.9 12/30/2023 0208   RBC 3.78 (L) 12/30/2023 0208   HGB 10.6 (L) 12/30/2023 0208   HCT 32.5 (L) 12/30/2023 0208   PLT 371 12/30/2023 0208   MCV 86.0 12/30/2023 0208   MCV 87.6 02/13/2016 1052   MCH 28.0 12/30/2023 0208   MCHC 32.6 12/30/2023 0208   RDW 12.1 12/30/2023 0208   LYMPHSABS 2.6 12/10/2021 0306   MONOABS 0.6 12/10/2021 0306   EOSABS 0.3 12/10/2021 0306   BASOSABS 0.1 12/10/2021 0306   CMP     Component Value Date/Time   NA 136 12/30/2023 0208   K 3.8 12/30/2023 0208   CL 100 12/30/2023 0208   CO2 25 12/30/2023 0208   GLUCOSE 140 (H) 12/30/2023 0208   BUN 12 12/30/2023 0208   CREATININE 0.90 12/30/2023 0208   CREATININE 0.78 02/13/2016 1040   CALCIUM 8.7 (L) 12/30/2023 0208   PROT 7.6 12/29/2023 1540   ALBUMIN 3.1 (L) 12/29/2023 1540   AST 16 12/29/2023 1540   ALT 11 12/29/2023 1540   ALKPHOS 49 12/29/2023 1540   BILITOT 0.4 12/29/2023 1540   GFR 103.62 09/18/2022 1538   GFRNONAA >60 12/30/2023 0208   GFRNONAA >89 02/13/2016 1040     Physical Exam: Constitutional: well-appearing sitting in no acute distress HENT: normocephalic atraumatic, mucous membranes moist Eyes: conjunctiva non-erythematous Neck: supple Cardiovascular: regular rate and rhythm, no m/r/g Pulmonary/Chest: normal work of breathing on room air, lungs clear to auscultation bilaterally; difficult to palpate pulses in lower extremity Abdominal: soft, non-tender, non-distended MSK: Ulcer on lateral component of right fifth toe with associated swelling and erythema; nontender with no purulence Neurological: alert & oriented x 3, 5/5 strength in bilateral upper and lower extremities, normal gait Skin: warm and dry  ASSESSMENT/PLAN:  Assessment: Principal Problem:   Osteomyelitis of right foot (HCC)  Dustin Baker is a 53 YO M with a history of poor controlled T2DM, HTN, and HLD presenting to the ED and being admitted with concerns for osteomyelitis.    Plan: #Diabetic Foot Ulcer of Right Foot #Concern for Osteomyelitis #Possible cellulitis White blood cell count stable at 6.9 this morning with no fever.  Evaluated by Ortho PA yesterday who believes patient will likely need surgical intervention for the left foot ulcer with likely amputation.  PT has signed off as he is currently  independent with mobility.  No procedure planned for today so diet remains.  If procedure scheduled tomorrow, will need to be n.p.o. at midnight. - Follow-up Ortho recs - Continue linezolid, cefepime, and Flagyl  - Follow-up ABIs - Trend fever curve and CBC  #T2DM #Diabetic Neuropathy  Glucose stable at 160 this morning.  Will continue sliding scale and home Lantus . - Continue Lantus  20 units daily and SSI  #HTN Stable with systolic in the 130s.  Continue amlodipine 5 mg/day.  #HLD Continue atorvastatin 40 mg/day  Best Practice: Diet: Carb modified VTE: Heparin Code: Full Prior to Admission Living Arrangement: Home, living with son Anticipated Discharge Location:  Home Barriers to Discharge: IV antibiotics  Dispo: Admit patient to Inpatient with expected length of stay greater than 2 midnights.  Signature: Maxie Spaniel, MD Internal Medicine Resident, PGY-1 [facility]  Pager: [phone]  Please contact the on call pager after 5 pm and on weekends at [phone].

## 2023-12-30 NOTE — Evaluation (Signed)
 Occupational Therapy Evaluation Patient Details Name: Dustin Baker MRN: [id number] DOB: August 30, 1971 Today's Date: 12/30/2023   History of Present Illness   Pt is a 53 y/o M presenting to ED on 4/27 wtih R foot wound x2 months, MRI reveal osteomyelitis. PMH includes DM2, HTN, HLD     Clinical Impressions Pt lives with son who is there PRN, pt ind with ADLs and mobility, does not drive.Pt has postop shoe for RLE is able to don and perform simulated ADLs/mobility without assist this session, reports at/close to baseline. Pt currently with no acute OT needs at this time, will sign off. Noted plan for possible surgery, please reconsult post-surgery if appropriate. Anticipate no OT follow up needs at this time.     If plan is discharge home, recommend the following:   Assist for transportation;Assistance with cooking/housework     Functional Status Assessment   Patient has had a recent decline in their functional status and demonstrates the ability to make significant improvements in function in a reasonable and predictable amount of time.     Equipment Recommendations   Other (comment) (defer post surgery)     Recommendations for Other Services         Precautions/Restrictions   Precautions Precautions: None Restrictions Weight Bearing Restrictions Per Provider Order: No     Mobility Bed Mobility Overal bed mobility: Independent                  Transfers Overall transfer level: Independent                        Balance Overall balance assessment: Mild deficits observed, not formally tested (due to post op shoe/ RLE wound)                                         ADL either performed or assessed with clinical judgement   ADL Overall ADL's : At baseline                                       General ADL Comments: pt at baseline with ADLs, no assist     Vision   Vision Assessment?: No apparent visual  deficits     Perception Perception: Not tested       Praxis Praxis: Not tested       Pertinent Vitals/Pain Pain Assessment Pain Assessment: No/denies pain     Extremity/Trunk Assessment Upper Extremity Assessment Upper Extremity Assessment: Overall WFL for tasks assessed   Lower Extremity Assessment Lower Extremity Assessment: Overall WFL for tasks assessed (R foot wound with post op shoe for RLE)   Cervical / Trunk Assessment Cervical / Trunk Assessment: Normal   Communication Communication Communication: No apparent difficulties   Cognition Arousal: Alert Behavior During Therapy: WFL for tasks assessed/performed Cognition: No apparent impairments                               Following commands: Intact       Cueing  General Comments   Cueing Techniques: Verbal cues  VSS   Exercises     Shoulder Instructions      Home Living Family/patient expects to be discharged to:: Private residence Living Arrangements: Children (  son, works Theme park manager) Available Help at Discharge: Family;Available PRN/intermittently Type of Home: House Home Access: Stairs to enter [employer] of Steps: 1   Home Layout: One level     Bathroom Shower/Tub: Runner, broadcasting/film/video: None          Prior Functioning/Environment Prior Level of Function : Independent/Modified Independent             Mobility Comments: no AD use ADLs Comments: ind iwth ADLs IADLs, doe not work    OT Problem List: Impaired balance (sitting and/or standing)   OT Treatment/Interventions:        OT Goals(Current goals can be found in the care plan section)   Acute Rehab OT Goals Patient Stated Goal: none stated OT Goal Formulation: With patient Time For Goal Achievement: 01/13/24 Potential to Achieve Goals: Good   OT Frequency:       Co-evaluation              AM-PAC OT "6 Clicks" Daily Activity     Outcome Measure Help from another person  eating meals?: None Help from another person taking care of personal grooming?: None Help from another person toileting, which includes using toliet, bedpan, or urinal?: None Help from another person bathing (including washing, rinsing, drying)?: None Help from another person to put on and taking off regular upper body clothing?: None Help from another person to put on and taking off regular lower body clothing?: None 6 Click Score: 24   End of Session Nurse Communication: Mobility status  Activity Tolerance: Patient tolerated treatment well Patient left: in bed;with call bell/phone within reach  OT Visit Diagnosis: Muscle weakness (generalized) (M62.81)                Time: 0865-7846 OT Time Calculation (min): 12 min Charges:  OT General Charges $OT Visit: 1 Visit OT Evaluation $OT Eval Low Complexity: 1 Low  Benedict Brain, OTD, OTR/L SecureChat Preferred Acute Rehab [phone]   Stefhanie Kachmar K Koonce 12/30/2023, 9:51 AM

## 2023-12-31 ENCOUNTER — Inpatient Hospital Stay (HOSPITAL_COMMUNITY)

## 2023-12-31 DIAGNOSIS — L97509 Non-pressure chronic ulcer of other part of unspecified foot with unspecified severity: Secondary | ICD-10-CM

## 2023-12-31 LAB — GLUCOSE, CAPILLARY
Glucose-Capillary: 166 mg/dL — ABNORMAL HIGH (ref 70–99)
Glucose-Capillary: 127 mg/dL — ABNORMAL HIGH (ref 70–99)
Glucose-Capillary: 163 mg/dL — ABNORMAL HIGH (ref 70–99)
Glucose-Capillary: 195 mg/dL — ABNORMAL HIGH (ref 70–99)

## 2023-12-31 LAB — BASIC METABOLIC PANEL WITH GFR
Anion gap: 8 (ref 5–15)
BUN: 9 mg/dL (ref 6–20)
CO2: 27 mmol/L (ref 22–32)
Calcium: 8.8 mg/dL — ABNORMAL LOW (ref 8.9–10.3)
Chloride: 102 mmol/L (ref 98–111)
Creatinine, Ser: 0.9 mg/dL (ref 0.61–1.24)
GFR, Estimated: >60 mL/min (ref >60)
Glucose, Bld: 161 mg/dL — ABNORMAL HIGH (ref 70–99)
Potassium: 4.3 mmol/L (ref 3.5–5.1)
Sodium: 137 mmol/L (ref 135–145)

## 2023-12-31 LAB — CBC
HCT: 33.9 % — ABNORMAL LOW (ref 39.0–52.0)
Hemoglobin: 11.1 g/dL — ABNORMAL LOW (ref 13.0–17.0)
MCH: 27.9 pg (ref 26.0–34.0)
MCHC: 32.7 g/dL (ref 30.0–36.0)
MCV: 85.2 fL (ref 80.0–100.0)
Platelets: 364 10*3/uL (ref 150–400)
RBC: 3.98 MIL/uL — ABNORMAL LOW (ref 4.22–5.81)
RDW: 12.1 % (ref 11.5–15.5)
WBC: 5.8 10*3/uL (ref 4.0–10.5)
nRBC: 0 % (ref 0.0–0.2)

## 2023-12-31 LAB — VAS US ABI WITH/WO TBI
Left ABI: 1.39
Right ABI: 1.24

## 2023-12-31 MED ORDER — METRONIDAZOLE 500 MG PO TABS
500.0000 mg | ORAL_TABLET | Freq: Two times a day (BID) | ORAL | Status: DC
  Filled 2023-12-31: qty 1

## 2023-12-31 MED ORDER — SODIUM CHLORIDE 0.9 % IV SOLN
2.0000 g | INTRAVENOUS | Status: DC
  Administered 2023-12-31 – 2024-01-03 (×4): 2 g via INTRAVENOUS
  Filled 2023-12-31 (×4): qty 20

## 2023-12-31 MED ORDER — LINEZOLID 600 MG PO TABS
600.0000 mg | ORAL_TABLET | Freq: Two times a day (BID) | ORAL | Status: DC
  Administered 2023-12-31 – 2024-01-03 (×7): 600 mg via ORAL
  Filled 2023-12-31 (×7): qty 1

## 2023-12-31 MED ORDER — METRONIDAZOLE 500 MG PO TABS
500.0000 mg | ORAL_TABLET | Freq: Two times a day (BID) | ORAL | Status: DC
  Administered 2023-12-31 – 2024-01-03 (×6): 500 mg via ORAL
  Filled 2023-12-31 (×6): qty 1

## 2023-12-31 NOTE — Progress Notes (Signed)
 Ankle-brachial index completed. Please see CV Procedures for preliminary results.  Estanislao Heimlich, RVT 12/31/23 9:26 AM

## 2023-12-31 NOTE — Plan of Care (Signed)

## 2023-12-31 NOTE — Consult Note (Signed)
 Reason for Consult: Right foot osteomyelitis Referring Physician: Sherline Distel, MD  Dustin Baker is an 53 y.o. male.  HPI: Patient presented to the emergency department after being told by his wound care specialist that MRI scan was concerning for osteomyelitis of the fifth metatarsal and surrounding soft tissue infection.  I was asked to see the patient due to the complexity of his infection and the possible need for surgery.  Patient has been dealing with a wound and swelling in his toe for several months.  He has been going to wound care.  Recent MRI scan was performed which was concerning for osteomyelitis and surrounding soft tissue infection.  Patient understands the severity of his issue and is agreeable to proceed with definitive treatment.  Denies fevers or chills.  Past Medical History:  Diagnosis Date   Allergy    Diabetes mellitus without complication (HCC)    Diabetes mellitus, type II (HCC)    GERD (gastroesophageal reflux disease)    High cholesterol    Hives of unknown origin    Hypertension    Neuropathy    Neuropathy     Past Surgical History:  Procedure Laterality Date   CATARACT EXTRACTION, BILATERAL     none     ORIF MANDIBULAR FRACTURE N/A 12/10/2021   Procedure: OPEN REDUCTION INTERNAL FIXATION (ORIF) MANDIBULAR FRACTURE;  Surgeon: Vernadine Golas, MD;  Location: [facility] OR;  Service: ENT;  Laterality: N/A;   VASECTOMY      Family History  Problem Relation Age of Onset   Depression Mother    Diabetes Father    Diabetes Paternal Grandfather     Social History:  reports that he has never smoked. He has never used smokeless tobacco. He reports current alcohol use. He reports that he does not use drugs.  Allergies:  Allergies  Allergen Reactions   Penicillins Other (See Comments)    Childhood reaction   Vancomycin  Itching    Redness and itching after infusion - infuse as slower rate     Medications: I have reviewed the patient's current  medications.  Results for orders placed or performed during the hospital encounter of 12/29/23 (from the past 48 hours)  Lipase, blood     Status: None   Collection Time: 12/29/23  3:40 PM  Result Value Ref Range   Lipase 38 11 - 51 U/L    Comment: Performed at [facility], [address] [place], [place] [address]  Comprehensive metabolic panel     Status: Abnormal   Collection Time: 12/29/23  3:40 PM  Result Value Ref Range   Sodium 133 (L) 135 - 145 mmol/L   Potassium 3.7 3.5 - 5.1 mmol/L   Chloride 97 (L) 98 - 111 mmol/L   CO2 24 22 - 32 mmol/L   Glucose, Bld 277 (H) 70 - 99 mg/dL    Comment: Glucose reference range applies only to samples taken after fasting for at least 8 hours.   BUN 14 6 - 20 mg/dL   Creatinine, Ser 3.66 0.61 - 1.24 mg/dL   Calcium 8.8 (L) 8.9 - 10.3 mg/dL   Total Protein 7.6 6.5 - 8.1 g/dL   Albumin 3.1 (L) 3.5 - 5.0 g/dL   AST 16 15 - 41 U/L   ALT 11 0 - 44 U/L   Alkaline Phosphatase 49 38 - 126 U/L   Total Bilirubin 0.4 0.0 - 1.2 mg/dL   GFR, Estimated >44 >03 mL/min    Comment: (NOTE) Calculated  using the CKD-EPI Creatinine Equation (2021)    Anion gap 12 5 - 15    Comment: Performed at [facility], [address], [place], [place] [address]  CBC     Status: Abnormal   Collection Time: 12/29/23  3:40 PM  Result Value Ref Range   WBC 6.8 4.0 - 10.5 K/uL   RBC 4.27 4.22 - 5.81 MIL/uL   Hemoglobin 12.1 (L) 13.0 - 17.0 g/dL   HCT 19.1 (L) 47.8 - 29.5 %   MCV 87.4 80.0 - 100.0 fL   MCH 28.3 26.0 - 34.0 pg   MCHC 32.4 30.0 - 36.0 g/dL   RDW 62.1 30.8 - 65.7 %   Platelets 440 (H) 150 - 400 K/uL   nRBC 0.0 0.0 - 0.2 %    Comment: Performed at [facility], [address], [place], [place] [address]  Urinalysis, Routine w reflex microscopic -Urine, Clean Catch     Status: Abnormal   Collection Time: 12/29/23  3:40 PM  Result Value Ref Range   Color, Urine AMBER (A) YELLOW    Comment: BIOCHEMICALS MAY BE AFFECTED BY COLOR    APPearance HAZY (A) CLEAR   Specific Gravity, Urine 1.026 1.005 - 1.030   pH 5.0 5.0 - 8.0   Glucose, UA >=500 (A) NEGATIVE mg/dL   Hgb urine dipstick SMALL (A) NEGATIVE   Bilirubin Urine NEGATIVE NEGATIVE   Ketones, ur 5 (A) NEGATIVE mg/dL   Protein, ur 295 (A) NEGATIVE mg/dL   Nitrite NEGATIVE NEGATIVE   Leukocytes,Ua NEGATIVE NEGATIVE   RBC / HPF 0-5 0 - 5 RBC/hpf   WBC, UA 0-5 0 - 5 WBC/hpf   Bacteria, UA NONE SEEN NONE SEEN   Squamous Epithelial / HPF 0-5 0 - 5 /HPF   Mucus PRESENT    Hyaline Casts, UA PRESENT     Comment: Performed at [facility], [address] [place], [place] [address]  I-Stat Lactic Acid     Status: Abnormal   Collection Time: 12/29/23  5:53 PM  Result Value Ref Range   Lactic Acid, Venous 2.7 (HH) 0.5 - 1.9 mmol/L   Comment MD NOTIFIED, SUGGEST RECOLLECT   Glucose, capillary     Status: Abnormal   Collection Time: 12/29/23  8:07 PM  Result Value Ref Range   Glucose-Capillary 251 (H) 70 - 99 mg/dL    Comment: Glucose reference range applies only to samples taken after fasting for at least 8 hours.  Lactic acid, plasma     Status: None   Collection Time: 12/29/23 11:14 PM  Result Value Ref Range   Lactic Acid, Venous 1.6 0.5 - 1.9 mmol/L    Comment: Performed at [facility], [address], [place], [place] [address]  Basic metabolic panel     Status: Abnormal   Collection Time: 12/30/23  2:08 AM  Result Value Ref Range   Sodium 136 135 - 145 mmol/L   Potassium 3.8 3.5 - 5.1 mmol/L   Chloride 100 98 - 111 mmol/L   CO2 25 22 - 32 mmol/L   Glucose, Bld 140 (H) 70 - 99 mg/dL    Comment: Glucose reference range applies only to samples taken after fasting for at least 8 hours.   BUN 12 6 - 20 mg/dL   Creatinine, Ser 0.27 0.61 - 1.24 mg/dL   Calcium 8.7 (L) 8.9 - 10.3 mg/dL   GFR, Estimated >25 >36 mL/min    Comment: (NOTE) Calculated  using the CKD-EPI Creatinine Equation (2021)    Anion gap 11 5 - 15    Comment: Performed at [facility], [address], [place], [place] [address]  CBC     Status: Abnormal   Collection Time: 12/30/23  2:08 AM  Result Value Ref Range   WBC 6.9 4.0 - 10.5 K/uL   RBC 3.78 (L) 4.22 - 5.81 MIL/uL   Hemoglobin 10.6 (L) 13.0 - 17.0 g/dL   HCT 60.4 (L) 54.0 - 98.1 %   MCV 86.0 80.0 - 100.0 fL   MCH 28.0 26.0 - 34.0 pg   MCHC 32.6 30.0 - 36.0 g/dL   RDW 19.1 47.8 - 29.5 %   Platelets 371 150 - 400 K/uL   nRBC 0.0 0.0 - 0.2 %    Comment: Performed at [facility], [address] [place], [place] [address]  HIV Antibody (routine testing w rflx)     Status: None   Collection Time: 12/30/23  2:08 AM  Result Value Ref Range   HIV Screen 4th Generation wRfx Non Reactive Non Reactive    Comment: Performed at [facility], [address], [place], [place] [address]  Lactic acid, plasma     Status: None   Collection Time: 12/30/23  2:08 AM  Result Value Ref Range   Lactic Acid, Venous 1.1 0.5 - 1.9 mmol/L    Comment: Performed at [facility], [address], [place], [place] [address]  Glucose, capillary     Status: Abnormal   Collection Time: 12/30/23  7:52 AM  Result Value Ref Range   Glucose-Capillary 161 (H) 70 - 99 mg/dL    Comment: Glucose reference range applies only to samples taken after fasting for at least 8 hours.  Glucose, capillary     Status: Abnormal   Collection Time: 12/30/23 11:49 AM  Result Value Ref Range   Glucose-Capillary 157 (H) 70 - 99 mg/dL    Comment: Glucose reference range applies only to samples taken after fasting for at least 8 hours.  Glucose, capillary     Status: Abnormal   Collection Time: 12/30/23  3:47 PM  Result Value Ref Range   Glucose-Capillary 137 (H) 70 - 99 mg/dL    Comment: Glucose reference range applies only to samples taken after fasting for at least 8 hours.  Glucose, capillary     Status: Abnormal   Collection Time: 12/30/23  8:41 PM  Result Value Ref Range   Glucose-Capillary 248 (H) 70 - 99 mg/dL     Comment: Glucose reference range applies only to samples taken after fasting for at least 8 hours.  CBC     Status: Abnormal   Collection Time: 12/31/23  4:31 AM  Result Value Ref Range   WBC 5.8 4.0 - 10.5 K/uL   RBC 3.98 (L) 4.22 - 5.81 MIL/uL   Hemoglobin 11.1 (L) 13.0 - 17.0 g/dL   HCT 95.2 (L) 84.1 - 32.4 %   MCV 85.2 80.0 - 100.0 fL   MCH 27.9 26.0 - 34.0 pg   MCHC 32.7 30.0 - 36.0 g/dL   RDW 40.1 02.7 - 25.3 %   Platelets 364 150 - 400 K/uL   nRBC 0.0 0.0 - 0.2 %    Comment: Performed at [facility], [address], [place], [place] [address]  Basic metabolic panel     Status: Abnormal   Collection Time: 12/31/23  4:31 AM  Result Value  Ref Range   Sodium 137 135 - 145 mmol/L   Potassium 4.3 3.5 - 5.1 mmol/L   Chloride 102 98 - 111 mmol/L   CO2 27 22 - 32 mmol/L   Glucose, Bld 161 (H) 70 - 99 mg/dL    Comment: Glucose reference range applies only to samples taken after fasting for at least 8 hours.   BUN 9 6 - 20 mg/dL   Creatinine, Ser 9.62 0.61 - 1.24 mg/dL   Calcium 8.8 (L) 8.9 - 10.3 mg/dL   GFR, Estimated >95 >28 mL/min    Comment: (NOTE) Calculated using the CKD-EPI Creatinine Equation (2021)    Anion gap 8 5 - 15    Comment: Performed at [facility], [address] [place], [place] [address]  Glucose, capillary     Status: Abnormal   Collection Time: 12/31/23  8:24 AM  Result Value Ref Range   Glucose-Capillary 166 (H) 70 - 99 mg/dL    Comment: Glucose reference range applies only to samples taken after fasting for at least 8 hours.   Comment 1 Notify RN     VAS US  ABI WITH/WO TBI Result Date: 12/31/2023  LOWER EXTREMITY DOPPLER STUDY Patient Name:  Dustin Baker  Date of Exam:   12/31/2023 Medical Rec #: [id number]          Accession #:    [id number] Date of Birth: 06/06/1971          Patient Gender: M Patient Age:   76 years Exam Location:  [facility] Procedure:      VAS US  ABI WITH/WO TBI Referring Phys: Kirt Pereyra  --------------------------------------------------------------------------------  Indications: Ulceration. High Risk Factors: Hypertension, Diabetes.  Comparison Study: None. Performing Technologist: Estanislao Heimlich  Examination Guidelines: A complete evaluation includes at minimum, Doppler waveform signals and systolic blood pressure reading at the level of bilateral brachial, anterior tibial, and posterior tibial arteries, when vessel segments are accessible. Bilateral testing is considered an integral part of a complete examination. Photoelectric Plethysmograph (PPG) waveforms and toe systolic pressure readings are included as required and additional duplex testing as needed. Limited examinations for reoccurring indications may be performed as noted.  ABI Findings: +---------+------------------+-----+---------+--------+ Right    Rt Pressure (mmHg)IndexWaveform Comment  +---------+------------------+-----+---------+--------+ Brachial 138                    triphasic         +---------+------------------+-----+---------+--------+ PTA      178               1.24 triphasic         +---------+------------------+-----+---------+--------+ DP       170               1.19 triphasic         +---------+------------------+-----+---------+--------+ Great Toe138               0.97 Normal            +---------+------------------+-----+---------+--------+ +---------+------------------+-----+---------+-------+ Left     Lt Pressure (mmHg)IndexWaveform Comment +---------+------------------+-----+---------+-------+ Brachial 143                    triphasic        +---------+------------------+-----+---------+-------+ PTA      199               1.39 triphasic        +---------+------------------+-----+---------+-------+ DP       178  1.24 triphasic        +---------+------------------+-----+---------+-------+ Great Toe135               0.94 Normal            +---------+------------------+-----+---------+-------+ +-------+-----------+-----------+------------+------------+ ABI/TBIToday's ABIToday's TBIPrevious ABIPrevious TBI +-------+-----------+-----------+------------+------------+ Right  1.24       0.97                                +-------+-----------+-----------+------------+------------+ Left   1.39       0.94                                +-------+-----------+-----------+------------+------------+  Summary: Right: Resting right ankle-brachial index is within normal range. The right toe-brachial index is normal. Left: Resting left ankle-brachial index indicates noncompressible left lower extremity arteries. The left toe-brachial index is normal. *See table(s) above for measurements and observations.     Preliminary     Review of Systems  Constitutional: Negative.   HENT: Negative.    Respiratory: Negative.    Cardiovascular: Negative.   Gastrointestinal: Negative.   Musculoskeletal:        Right foot wound and swelling  Neurological:  Positive for numbness.  All other systems reviewed and are negative.  Blood pressure (!) 127/90, pulse 80, temperature 98.1 F (36.7 C), temperature source Oral, resp. rate 19, height 5\' 9"  (1.753 m), weight 109.5 kg, SpO2 98%. Physical Exam HENT:     Head: Atraumatic.     Mouth/Throat:     Mouth: Mucous membranes are moist.  Eyes:     Extraocular Movements: Extraocular movements intact.  Cardiovascular:     Rate and Rhythm: Normal rate.  Pulmonary:     Effort: Pulmonary effort is normal.  Abdominal:     Palpations: Abdomen is soft.  Musculoskeletal:     Cervical back: Neck supple.     Comments: Right foot demonstrates swelling and redness about the forefoot.  There is a wound overlying the fifth metatarsal area.  There is swelling there.  Baseline sensory deficits.  Some drainage from the wound.  Toes are warm and well-perfused otherwise.  Neurological:     Mental Status: He is  alert. Mental status is at baseline.  Psychiatric:        Mood and Affect: Mood normal.     Assessment/Plan: Patient has right fifth metatarsal osteomyelitis with underlying bony erosion and fracturing.  He has surrounding soft tissue infection.  Patient is agreeable to proceed with definitive surgery in the form of fifth ray resection and I&D of forefoot abscess.  We will look for surgery time this week.  For now continue antibiotics and current treatment.  Will update with surgery time once this has been established.  Patient is agreeable with this.  He may heel weight-bear.  Continue with dressing changes as needed.  Patient understands the risks and benefits of surgery which include but are not limited to wound healing complications, infection, need for further surgery, damage to surrounding structures and the possibility of further surgeries down the line.  He would like to proceed with surgery given that he has been dealing with this wound for a long time and it has not responded well to wound care and outpatient therapy.  Donnamarie Gables 12/31/2023, 10:04 AM

## 2023-12-31 NOTE — Progress Notes (Signed)
 HD#2 SUBJECTIVE:  Patient Summary:  Dustin Baker is a 53 YO M with a history of poor controlled T2DM, HTN, and HLD presenting to the ED and being admitted with concerns for osteomyelitis.    Overnight Events: NAEO  Interim History: Patient was evaluated at bedside.  Denies any chest pain, nausea, vomiting, pain, fever, chills, or other signs or symptoms.  Notes that he was seen by Ortho yesterday who was tentatively planning for operation on Thursday.  OBJECTIVE:  Vital Signs: Vitals:   12/30/23 0753 12/30/23 1548 12/30/23 2040 12/31/23 0753  BP: (!) 131/94 120/76 133/87 (!) 127/90  Pulse: 78 92 89 80  Resp:   19 19  Temp: 98.1 F (36.7 C) 98.3 F (36.8 C) 97.8 F (36.6 C) 98.1 F (36.7 C)  TempSrc: Oral  Oral Oral  SpO2: 99% 98% 96% 98%  Weight:      Height:       Supplemental O2: Room Air SpO2: 98 %  Filed Weights   12/29/23 1536  Weight: 109.5 kg     Intake/Output Summary (Last 24 hours) at 12/31/2023 0833 Last data filed at 12/31/2023 0537 Gross per 24 hour  Intake 1110 ml  Output --  Net 1110 ml   Net IO Since Admission: 1,710 mL [12/31/23 0833]  CBC    Component Value Date/Time   WBC 5.8 12/31/2023 0431   RBC 3.98 (L) 12/31/2023 0431   HGB 11.1 (L) 12/31/2023 0431   HCT 33.9 (L) 12/31/2023 0431   PLT 364 12/31/2023 0431   MCV 85.2 12/31/2023 0431   MCV 87.6 02/13/2016 1052   MCH 27.9 12/31/2023 0431   MCHC 32.7 12/31/2023 0431   RDW 12.1 12/31/2023 0431   LYMPHSABS 2.6 12/10/2021 0306   MONOABS 0.6 12/10/2021 0306   EOSABS 0.3 12/10/2021 0306   BASOSABS 0.1 12/10/2021 0306   CMP     Component Value Date/Time   NA 137 12/31/2023 0431   K 4.3 12/31/2023 0431   CL 102 12/31/2023 0431   CO2 27 12/31/2023 0431   GLUCOSE 161 (H) 12/31/2023 0431   BUN 9 12/31/2023 0431   CREATININE 0.90 12/31/2023 0431   CREATININE 0.78 02/13/2016 1040   CALCIUM 8.8 (L) 12/31/2023 0431   PROT 7.6 12/29/2023 1540   ALBUMIN 3.1 (L) 12/29/2023 1540   AST 16  12/29/2023 1540   ALT 11 12/29/2023 1540   ALKPHOS 49 12/29/2023 1540   BILITOT 0.4 12/29/2023 1540   GFR 103.62 09/18/2022 1538   GFRNONAA >60 12/31/2023 0431   GFRNONAA >89 02/13/2016 1040   Physical Exam Constitutional:      Appearance: Normal appearance.  Cardiovascular:     Rate and Rhythm: Normal rate and regular rhythm.  Pulmonary:     Effort: Pulmonary effort is normal.     Breath sounds: Normal breath sounds.  Abdominal:     General: Abdomen is flat. Bowel sounds are normal.     Palpations: Abdomen is soft.  Feet:     Comments: Ulcer on lateral component of right fifth toe with associated swelling and erythema; nontender with no purulence Skin:    General: Skin is warm.  Neurological:     General: No focal deficit present.     Mental Status: He is alert and oriented to person, place, and time.  Psychiatric:        Mood and Affect: Mood normal.        Behavior: Behavior normal. Behavior is cooperative.      ASSESSMENT/PLAN:  Assessment: Principal Problem:   Osteomyelitis of right foot (HCC)  Dustin Baker is a 53 YO M with a history of poor controlled T2DM, HTN, and HLD presenting to the ED and being admitted with concerns for osteomyelitis.    Plan: #Diabetic Foot Ulcer of Right Foot #Concern for Osteomyelitis #Possible cellulitis White blood cell count remains stable at 5.8.  Afebrile.  Per patient, seen by orthopedic team yesterday who is tentatively planning for operation on Thursday with likely amputation of his right fifth toe.  ABI pending.  Per chart review, no note but will follow-up with orthopedic team for clarification of neck steps. - Follow-up ortho recs - Continue linezolid, cefepime, and Flagyl  (day 3) - Follow-up ABIs - Trend fever curve and CBC  #T2DM #Diabetic Neuropathy  Glucose stable at 166 this morning.  Will continue sliding scale and home Lantus . - Continue Lantus  20 units daily and SSI  #HTN Stable with systolic in the 120s.   Continue amlodipine 5 mg/day.  #HLD Continue atorvastatin 40 mg/day  Best Practice: Diet: Carb modified VTE: Heparin Code: Full Prior to Admission Living Arrangement: Home, living with son Anticipated Discharge Location: Home Barriers to Discharge: IV antibiotics  Dispo: Admit patient to Inpatient with expected length of stay greater than 2 midnights.  Signature: Maxie Spaniel, MD Internal Medicine Resident, PGY-1 [facility]  Pager: [phone]  Please contact the on call pager after 5 pm and on weekends at [phone].

## 2024-01-01 ENCOUNTER — Other Ambulatory Visit: Payer: Self-pay | Admitting: Orthopaedic Surgery

## 2024-01-01 DIAGNOSIS — M86171 Other acute osteomyelitis, right ankle and foot: Secondary | ICD-10-CM | POA: Diagnosis not present

## 2024-01-01 DIAGNOSIS — E1142 Type 2 diabetes mellitus with diabetic polyneuropathy: Secondary | ICD-10-CM | POA: Diagnosis not present

## 2024-01-01 DIAGNOSIS — E1169 Type 2 diabetes mellitus with other specified complication: Secondary | ICD-10-CM | POA: Diagnosis not present

## 2024-01-01 LAB — CBC WITH DIFFERENTIAL/PLATELET
Abs Immature Granulocytes: 0.02 10*3/uL (ref 0.00–0.07)
Basophils Absolute: 0.1 10*3/uL (ref 0.0–0.1)
Basophils Relative: 1 %
Eosinophils Absolute: 0.1 10*3/uL (ref 0.0–0.5)
Eosinophils Relative: 2 %
HCT: 34.8 % — ABNORMAL LOW (ref 39.0–52.0)
Hemoglobin: 11.5 g/dL — ABNORMAL LOW (ref 13.0–17.0)
Immature Granulocytes: 0 %
Lymphocytes Relative: 36 %
Lymphs Abs: 2.1 10*3/uL (ref 0.7–4.0)
MCH: 28.4 pg (ref 26.0–34.0)
MCHC: 33 g/dL (ref 30.0–36.0)
MCV: 85.9 fL (ref 80.0–100.0)
Monocytes Absolute: 0.6 10*3/uL (ref 0.1–1.0)
Monocytes Relative: 10 %
Neutro Abs: 3 10*3/uL (ref 1.7–7.7)
Neutrophils Relative %: 51 %
Platelets: 373 10*3/uL (ref 150–400)
RBC: 4.05 MIL/uL — ABNORMAL LOW (ref 4.22–5.81)
RDW: 12.1 % (ref 11.5–15.5)
WBC: 5.8 10*3/uL (ref 4.0–10.5)
nRBC: 0 % (ref 0.0–0.2)

## 2024-01-01 LAB — GLUCOSE, CAPILLARY
Glucose-Capillary: 189 mg/dL — ABNORMAL HIGH (ref 70–99)
Glucose-Capillary: 173 mg/dL — ABNORMAL HIGH (ref 70–99)
Glucose-Capillary: 191 mg/dL — ABNORMAL HIGH (ref 70–99)
Glucose-Capillary: 242 mg/dL — ABNORMAL HIGH (ref 70–99)

## 2024-01-01 NOTE — Progress Notes (Signed)
   01/01/24 1742  Mobility  Activity Ambulated independently in hallway;Ambulated independently to bathroom  Level of Assistance Independent  Assistive Device None  Distance Ambulated (ft) 275 ft  Activity Response Tolerated well  Mobility Referral Yes  Mobility visit 1 Mobility  Mobility Specialist Start Time (ACUTE ONLY) 1742  Mobility Specialist Stop Time (ACUTE ONLY) 1747  Mobility Specialist Time Calculation (min) (ACUTE ONLY) 5 min   Mobility Specialist: Progress Note  Pt agreeable to mobility session - received in bed. Pt was asymptomatic throughout session with no complaints. Pt encouraged to mobilize with therapy post-surgery.  Returned to standing in room with all needs met - call bell within reach.   Isla Mari, BS Mobility Specialist Please contact via SecureChat or  Rehab office at [phone].

## 2024-01-01 NOTE — Plan of Care (Signed)

## 2024-01-01 NOTE — Progress Notes (Signed)
 HD#3 SUBJECTIVE:  Patient Summary:  Dustin Baker is a 53 YO M with a history of poor controlled T2DM, HTN, and HLD presenting to the ED and being admitted with concerns for osteomyelitis.    Overnight Events: NAEO  Interim History: Patient was evaluated at bedside.  Denies any chest pain, nausea, vomiting, troubles breathing, fever, chills, or any other signs or symptoms.  OBJECTIVE:  Vital Signs: Vitals:   12/31/23 1055 12/31/23 1625 12/31/23 1944 01/01/24 0753  BP: (!) 131/91 (!) 131/93 (!) 126/92 137/85  Pulse: 74 90 89 79  Resp:  18 19 19   Temp:  98.1 F (36.7 C) 98.3 F (36.8 C) 98.1 F (36.7 C)  TempSrc:  Oral Oral Oral  SpO2:  98% 97% 98%  Weight:      Height:       Supplemental O2: Room Air SpO2: 98 %  Filed Weights   12/29/23 1536  Weight: 109.5 kg     Intake/Output Summary (Last 24 hours) at 01/01/2024 1000 Last data filed at 01/01/2024 0339 Gross per 24 hour  Intake 100 ml  Output --  Net 100 ml   Net IO Since Admission: 1,810 mL [01/01/24 1000]  CBC    Component Value Date/Time   WBC 5.8 01/01/2024 0615   RBC 4.05 (L) 01/01/2024 0615   HGB 11.5 (L) 01/01/2024 0615   HCT 34.8 (L) 01/01/2024 0615   PLT 373 01/01/2024 0615   MCV 85.9 01/01/2024 0615   MCV 87.6 02/13/2016 1052   MCH 28.4 01/01/2024 0615   MCHC 33.0 01/01/2024 0615   RDW 12.1 01/01/2024 0615   LYMPHSABS 2.1 01/01/2024 0615   MONOABS 0.6 01/01/2024 0615   EOSABS 0.1 01/01/2024 0615   BASOSABS 0.1 01/01/2024 0615   CMP     Component Value Date/Time   NA 137 12/31/2023 0431   K 4.3 12/31/2023 0431   CL 102 12/31/2023 0431   CO2 27 12/31/2023 0431   GLUCOSE 161 (H) 12/31/2023 0431   BUN 9 12/31/2023 0431   CREATININE 0.90 12/31/2023 0431   CREATININE 0.78 02/13/2016 1040   CALCIUM 8.8 (L) 12/31/2023 0431   PROT 7.6 12/29/2023 1540   ALBUMIN 3.1 (L) 12/29/2023 1540   AST 16 12/29/2023 1540   ALT 11 12/29/2023 1540   ALKPHOS 49 12/29/2023 1540   BILITOT 0.4 12/29/2023  1540   GFR 103.62 09/18/2022 1538   GFRNONAA >60 12/31/2023 0431   GFRNONAA >89 02/13/2016 1040   Physical Exam Constitutional:      Appearance: Normal appearance.  Cardiovascular:     Rate and Rhythm: Normal rate and regular rhythm.  Pulmonary:     Effort: Pulmonary effort is normal.     Breath sounds: Normal breath sounds.  Abdominal:     General: Abdomen is flat. Bowel sounds are normal.     Palpations: Abdomen is soft.  Feet:     Comments: Right foot covered with bandaging Skin:    General: Skin is warm.  Neurological:     General: No focal deficit present.     Mental Status: He is alert and oriented to person, place, and time.  Psychiatric:        Mood and Affect: Mood normal.        Behavior: Behavior normal. Behavior is cooperative.     ASSESSMENT/PLAN:  Assessment: Principal Problem:   Osteomyelitis of right foot (HCC)  Dustin Baker is a 53 YO M with a history of poor controlled T2DM, HTN, and HLD presenting  to the ED and being admitted with concerns for osteomyelitis.    Plan: #Diabetic Foot Ulcer of Right Foot #Concern for Osteomyelitis #Possible cellulitis WBC stable at 5.8 and patient remains afebrile.  Ortho planning for procedure tomorrow at 1215 for partial amputation.  Patient will be n.p.o. effective midnight.  ABIs yesterday normal at 1.24 in the right and 1.39 in the left.  Today is day 4 of antibiotic treatment.  Yesterday, cefepime also narrowed to ceftriaxone . Linezolid and Flagyl  transitioned to oral formulation.  - Follow-up ortho recs - Continue linezolid 600 mg q12 hrs, ceftriaxone  2 g q24 hrs, and Flagyl  500 mg q12hrs (day 4 of treatment) - Follow-up ABIs - Trend fever curve and CBC - NPO effective midnight  #T2DM #Diabetic Neuropathy  Stable. Continue SSI and home Lantus  20 units/day.   #HTN Stable with systolic in the 120s.  Continue amlodipine 5 mg/day.  #HLD Continue atorvastatin 40 mg/day  Best Practice: Diet: Carb modified (NPO  at midnight) VTE: Heparin Code: Full Prior to Admission Living Arrangement: Home, living with son Anticipated Discharge Location: Home Barriers to Discharge: IV antibiotics  Dispo: Admit patient to Inpatient with expected length of stay greater than 2 midnights.  Signature: Maxie Spaniel, MD Internal Medicine Resident, PGY-1 [facility]  Pager: [phone]  Please contact the on call pager after 5 pm and on weekends at [phone].

## 2024-01-02 ENCOUNTER — Other Ambulatory Visit: Payer: Self-pay

## 2024-01-02 ENCOUNTER — Inpatient Hospital Stay (HOSPITAL_COMMUNITY): Admitting: Certified Registered Nurse Anesthetist

## 2024-01-02 ENCOUNTER — Encounter (HOSPITAL_COMMUNITY)
Admission: EM | Disposition: A | Discharge status: Home / Self Care | Payer: Self-pay | Source: Home / Self Care | Attending: Infectious Diseases

## 2024-01-02 ENCOUNTER — Encounter (HOSPITAL_COMMUNITY): Payer: Self-pay | Admitting: Internal Medicine

## 2024-01-02 DIAGNOSIS — M86271 Subacute osteomyelitis, right ankle and foot: Secondary | ICD-10-CM | POA: Diagnosis not present

## 2024-01-02 DIAGNOSIS — E1140 Type 2 diabetes mellitus with diabetic neuropathy, unspecified: Secondary | ICD-10-CM | POA: Diagnosis not present

## 2024-01-02 DIAGNOSIS — E1169 Type 2 diabetes mellitus with other specified complication: Secondary | ICD-10-CM | POA: Diagnosis not present

## 2024-01-02 HISTORY — PX: AMPUTATION: SHX166

## 2024-01-02 LAB — GLUCOSE, CAPILLARY
Glucose-Capillary: 161 mg/dL — ABNORMAL HIGH (ref 70–99)
Glucose-Capillary: 144 mg/dL — ABNORMAL HIGH (ref 70–99)
Glucose-Capillary: 117 mg/dL — ABNORMAL HIGH (ref 70–99)
Glucose-Capillary: 123 mg/dL — ABNORMAL HIGH (ref 70–99)
Glucose-Capillary: 214 mg/dL — ABNORMAL HIGH (ref 70–99)
Glucose-Capillary: 270 mg/dL — ABNORMAL HIGH (ref 70–99)

## 2024-01-02 LAB — SURGICAL PCR SCREEN
MRSA, PCR: NEGATIVE
Staphylococcus aureus: NEGATIVE

## 2024-01-02 SURGERY — AMPUTATION, FOOT, PARTIAL
Anesthesia: General | Site: Foot | Laterality: Right

## 2024-01-02 MED ORDER — LIDOCAINE 2% (20 MG/ML) 5 ML SYRINGE
INTRAMUSCULAR | Status: DC | PRN
  Administered 2024-01-02: 60 mg via INTRAVENOUS

## 2024-01-02 MED ORDER — PROPOFOL 10 MG/ML IV BOLUS
INTRAVENOUS | Status: DC | PRN
  Administered 2024-01-02: 200 mg via INTRAVENOUS

## 2024-01-02 MED ORDER — FENTANYL CITRATE (PF) 250 MCG/5ML IJ SOLN
INTRAMUSCULAR | Status: DC | PRN
  Administered 2024-01-02: 100 ug via INTRAVENOUS

## 2024-01-02 MED ORDER — ONDANSETRON HCL 4 MG PO TABS
4.0000 mg | ORAL_TABLET | Freq: Four times a day (QID) | ORAL | Status: DC | PRN
Start: 2024-01-02 — End: 2024-01-03

## 2024-01-02 MED ORDER — FENTANYL CITRATE (PF) 100 MCG/2ML IJ SOLN: 25.0000 ug | INTRAMUSCULAR | Status: DC | PRN

## 2024-01-02 MED ORDER — ONDANSETRON HCL 4 MG/2ML IJ SOLN
INTRAMUSCULAR | Status: AC
  Filled 2024-01-02: qty 2

## 2024-01-02 MED ORDER — DEXAMETHASONE SODIUM PHOSPHATE 10 MG/ML IJ SOLN
INTRAMUSCULAR | Status: AC
  Filled 2024-01-02: qty 1

## 2024-01-02 MED ORDER — OXYCODONE HCL 5 MG PO TABS: 5.0000 mg | ORAL_TABLET | ORAL | Status: DC | PRN

## 2024-01-02 MED ORDER — CHLORHEXIDINE GLUCONATE 0.12 % MT SOLN
OROMUCOSAL | Status: AC
  Administered 2024-01-02: 15 mL via OROMUCOSAL
  Filled 2024-01-02: qty 15

## 2024-01-02 MED ORDER — ONDANSETRON HCL 4 MG/2ML IJ SOLN: 4.0000 mg | Freq: Four times a day (QID) | INTRAMUSCULAR | Status: DC | PRN

## 2024-01-02 MED ORDER — CHLORHEXIDINE GLUCONATE 0.12 % MT SOLN
15.0000 mL | Freq: Once | OROMUCOSAL | Status: AC
  Filled 2024-01-02: qty 15

## 2024-01-02 MED ORDER — FENTANYL CITRATE (PF) 250 MCG/5ML IJ SOLN
INTRAMUSCULAR | Status: AC
  Filled 2024-01-02: qty 5

## 2024-01-02 MED ORDER — PROPOFOL 10 MG/ML IV BOLUS
INTRAVENOUS | Status: AC
  Filled 2024-01-02: qty 20

## 2024-01-02 MED ORDER — DEXAMETHASONE SODIUM PHOSPHATE 10 MG/ML IJ SOLN
INTRAMUSCULAR | Status: DC | PRN
  Administered 2024-01-02: 5 mg via INTRAVENOUS

## 2024-01-02 MED ORDER — EPHEDRINE 5 MG/ML INJ
INTRAVENOUS | Status: AC
  Filled 2024-01-02: qty 5

## 2024-01-02 MED ORDER — DOCUSATE SODIUM 100 MG PO CAPS
100.0000 mg | ORAL_CAPSULE | Freq: Two times a day (BID) | ORAL | Status: DC
  Administered 2024-01-03: 100 mg via ORAL
  Filled 2024-01-02 (×2): qty 1

## 2024-01-02 MED ORDER — ONDANSETRON HCL 4 MG/2ML IJ SOLN
INTRAMUSCULAR | Status: DC | PRN
  Administered 2024-01-02: 4 mg via INTRAVENOUS

## 2024-01-02 MED ORDER — PHENYLEPHRINE HCL-NACL 20-0.9 MG/250ML-% IV SOLN
INTRAVENOUS | Status: DC | PRN
  Administered 2024-01-02 (×3): 160 ug via INTRAVENOUS
  Administered 2024-01-02: 80 ug via INTRAVENOUS

## 2024-01-02 MED ORDER — MIDAZOLAM HCL 2 MG/2ML IJ SOLN
INTRAMUSCULAR | Status: AC
  Filled 2024-01-02: qty 2

## 2024-01-02 MED ORDER — LACTATED RINGERS IV SOLN: INTRAVENOUS | Status: DC

## 2024-01-02 MED ORDER — OXYCODONE HCL 5 MG/5ML PO SOLN: 5.0000 mg | Freq: Once | ORAL | Status: DC | PRN

## 2024-01-02 MED ORDER — INSULIN ASPART 100 UNIT/ML IJ SOLN: 0.0000 [IU] | INTRAMUSCULAR | Status: DC | PRN

## 2024-01-02 MED ORDER — OXYCODONE HCL 5 MG PO TABS: 5.0000 mg | ORAL_TABLET | Freq: Once | ORAL | Status: DC | PRN

## 2024-01-02 MED ORDER — PHENYLEPHRINE 80 MCG/ML (10ML) SYRINGE FOR IV PUSH (FOR BLOOD PRESSURE SUPPORT)
PREFILLED_SYRINGE | INTRAVENOUS | Status: AC
  Filled 2024-01-02: qty 10

## 2024-01-02 MED ORDER — LIDOCAINE 2% (20 MG/ML) 5 ML SYRINGE
INTRAMUSCULAR | Status: AC
  Filled 2024-01-02: qty 5

## 2024-01-02 MED ORDER — MIDAZOLAM HCL 5 MG/5ML IJ SOLN
INTRAMUSCULAR | Status: DC | PRN
  Administered 2024-01-02: 2 mg via INTRAVENOUS

## 2024-01-02 MED ORDER — EPHEDRINE SULFATE-NACL 50-0.9 MG/10ML-% IV SOSY
PREFILLED_SYRINGE | INTRAVENOUS | Status: DC | PRN
  Administered 2024-01-02: 5 mg via INTRAVENOUS

## 2024-01-02 MED ORDER — ORAL CARE MOUTH RINSE: 15.0000 mL | Freq: Once | OROMUCOSAL | Status: AC

## 2024-01-02 SURGICAL SUPPLY — 34 items
BAG COUNTER SPONGE SURGICOUNT (BAG) IMPLANT
BLADE AVERAGE 25X9 (BLADE) IMPLANT
BNDG ELASTIC 4X5.8 VLCR NS LF (GAUZE/BANDAGES/DRESSINGS) IMPLANT
BNDG ELASTIC 4X5.8 VLCR STR LF (GAUZE/BANDAGES/DRESSINGS) ×1 IMPLANT
BNDG ESMARK 4X9 LF (GAUZE/BANDAGES/DRESSINGS) IMPLANT
BNDG GAUZE DERMACEA FLUFF 4 (GAUZE/BANDAGES/DRESSINGS) ×1 IMPLANT
CNTNR URN SCR LID CUP LEK RST (MISCELLANEOUS) IMPLANT
COVER SURGICAL LIGHT HANDLE (MISCELLANEOUS) ×2 IMPLANT
DRAPE U-SHAPE 47X51 STRL (DRAPES) ×1 IMPLANT
DRSG CURAD 3X16 NADH (PACKING) IMPLANT
DRSG XEROFORM 1X8 (GAUZE/BANDAGES/DRESSINGS) IMPLANT
DURAPREP 26ML APPLICATOR (WOUND CARE) ×1 IMPLANT
ELECTRODE REM PT RTRN 9FT ADLT (ELECTROSURGICAL) ×1 IMPLANT
GAUZE SPONGE 4X4 12PLY STRL (GAUZE/BANDAGES/DRESSINGS) IMPLANT
GLOVE BIOGEL M STRL SZ7.5 (GLOVE) ×1 IMPLANT
GLOVE INDICATOR 8.0 STRL GRN (GLOVE) ×1 IMPLANT
GLOVE SRG 8 PF TXTR STRL LF DI (GLOVE) ×1 IMPLANT
GOWN STRL REUS W/ TWL LRG LVL3 (GOWN DISPOSABLE) ×1 IMPLANT
GOWN STRL REUS W/ TWL XL LVL3 (GOWN DISPOSABLE) ×1 IMPLANT
KIT BASIN OR (CUSTOM PROCEDURE TRAY) ×1 IMPLANT
KIT TURNOVER KIT B (KITS) ×1 IMPLANT
MANIFOLD NEPTUNE II (INSTRUMENTS) ×1 IMPLANT
NDL 22X1.5 STRL (OR ONLY) (MISCELLANEOUS) IMPLANT
NEEDLE 22X1.5 STRL (OR ONLY) (MISCELLANEOUS) IMPLANT
NS IRRIG 1000ML POUR BTL (IV SOLUTION) ×1 IMPLANT
PACK ORTHO EXTREMITY (CUSTOM PROCEDURE TRAY) ×1 IMPLANT
PAD ARMBOARD POSITIONER FOAM (MISCELLANEOUS) ×2 IMPLANT
PAD CAST 4YDX4 CTTN HI CHSV (CAST SUPPLIES) IMPLANT
SET CYSTO W/LG BORE CLAMP LF (SET/KITS/TRAYS/PACK) IMPLANT
SUT ETHILON 2 0 FS 18 (SUTURE) ×1 IMPLANT
SYR CONTROL 10ML LL (SYRINGE) IMPLANT
TOWEL GREEN STERILE (TOWEL DISPOSABLE) ×1 IMPLANT
TUBE CONNECTING 20X1/4 (TUBING) ×1 IMPLANT
YANKAUER SUCT BULB TIP NO VENT (SUCTIONS) ×1 IMPLANT

## 2024-01-02 NOTE — Anesthesia Procedure Notes (Signed)
 Procedure Name: LMA Insertion Date/Time: 01/02/2024 1:07 PM  Performed by: Colen Daunt, CRNAPre-anesthesia Checklist: Patient identified, Emergency Drugs available, Suction available and Patient being monitored Patient Re-evaluated:Patient Re-evaluated prior to induction Oxygen Delivery Method: Circle System Utilized Preoxygenation: Pre-oxygenation with 100% oxygen Induction Type: IV induction Ventilation: Mask ventilation without difficulty LMA: LMA inserted LMA Size: 4.0 Number of attempts: 1 Placement Confirmation: positive ETCO2 Tube secured with: Tape Dental Injury: Teeth and Oropharynx as per pre-operative assessment

## 2024-01-02 NOTE — Plan of Care (Signed)

## 2024-01-02 NOTE — Progress Notes (Signed)
   HD#4 SUBJECTIVE:  Patient Summary:  Dustin Baker is a 53 YO M with a history of poor controlled T2DM, HTN, and HLD presenting to the ED and being admitted with concerns for osteomyelitis.    Overnight Events: NAEO  Interim History: Patient was evaluated at bedside.  Denies any chest pain, nausea, vomiting, fevers, chills, or other signs or symptoms.  OBJECTIVE:  Vital Signs: Vitals:   01/01/24 1622 01/01/24 2046 01/02/24 0419 01/02/24 0740  BP: (!) 131/93 132/82 110/69 126/87  Pulse: 89 88 76 76  Resp: 18 20 20 19   Temp: 98.5 F (36.9 C) 99 F (37.2 C) 98.2 F (36.8 C) 98 F (36.7 C)  TempSrc: Oral Oral  Oral  SpO2: 98% 99% 98% 100%  Weight:      Height:       Supplemental O2: Room Air SpO2: 100 %  Filed Weights   12/29/23 1536  Weight: 109.5 kg    No intake or output data in the 24 hours ending 01/02/24 0852  Net IO Since Admission: 1,810 mL [01/02/24 0852]  Physical Exam Constitutional:      Appearance: Normal appearance.  HENT:     Head: Normocephalic and atraumatic.  Feet:     Comments: Ulcer on lateral component of right fifth toe with associated swelling and erythema; nontender with no purulence Skin:    General: Skin is warm.  Neurological:     General: No focal deficit present.     Mental Status: He is alert and oriented to person, place, and time.  Psychiatric:        Mood and Affect: Mood normal.        Behavior: Behavior normal. Behavior is cooperative.    ASSESSMENT/PLAN:  Assessment: Principal Problem:   Osteomyelitis of right foot (HCC)  Dustin Baker is a 53 YO M with a history of poor controlled T2DM, HTN, and HLD presenting to the ED and being admitted with concerns for osteomyelitis.    Plan: #Diabetic Foot Ulcer of Right Foot #Concern for Osteomyelitis #Possible cellulitis No labs collected this morning given patient's stable values.  He remains afebrile.  Scheduled for amputation of his right fifth toe later this afternoon.  Today  is day 5 of antibiotic treatment.  Will continue on current antibiotic regimen of linezolid , ceftriaxone , and Flagyl . - Follow-up ortho recs - Continue linezolid  600 mg q12 hrs, ceftriaxone  2 g q24 hrs, and Flagyl  500 mg q12hrs (day 5 of treatment)  - Trend fever curve and CBC  #T2DM #Diabetic Neuropathy  Stable. Will continue sliding scale and home Lantus  20 units/day.  #HTN Stable. Continue amlodipine  5 mg/day.  #HLD Continue atorvastatin  40 mg/day  Best Practice: Diet: NPO VTE: None (heparin  stopped prior to surgery) Code: Full Prior to Admission Living Arrangement: Home, living with son Anticipated Discharge Location: Home Barriers to Discharge: IV antibiotics  Dispo: Admit patient to Inpatient with expected length of stay greater than 2 midnights.  Signature: Maxie Spaniel, MD Internal Medicine Resident, PGY-1 [facility]  Pager: [phone]  Please contact the on call pager after 5 pm and on weekends at [phone].

## 2024-01-02 NOTE — Progress Notes (Signed)
     Dustin Baker III is a 53 y.o. male   Orthopaedic diagnosis: Right fifth metatarsal osteomyelitis and forefoot abscess.  Subjective: Patient was seen in preop.  He had some questions that were answered.  Continues to have issues with his foot.  Notes swelling and erythema.  Pain controlled.  Is ready for surgery.  Objectyive: Vitals:   01/02/24 1034 01/02/24 1104  BP: (!) 136/93   Pulse:  75  Resp:  16  Temp:  98.3 F (36.8 C)  SpO2:  94%     Exam: Awake and alert Respirations even and unlabored No acute distress  Right foot with swelling and wound dorsally.  There is some erythema.  Scab present.  Foot is warm well-perfused.  Assessment: Right lateral foot wound with MRI evidence of fifth metatarsal osteomyelitis and erosion with fracture as well as surrounding soft tissue and concern for abscess   Plan: Will plan for right lateral ray resection and incision drainage of abscess.  Will plan for primary closure.  May place antibiotic powder.  Will send cultures.  We discussed the risks and benefits of surgery which include but are not limited to wound healing complication, continued infection, need for further surgery and he understands.  There is also anesthetic risk that he will discuss with the anesthesiologist.  He would like to proceed.   Vickki Grandchild, MD

## 2024-01-02 NOTE — Transfer of Care (Signed)
 Immediate Anesthesia Transfer of Care Note  Patient: Dustin Baker  Procedure(s) Performed: AMPUTATION, FOOT, PARTIAL (Right: Foot)  Patient Location: PACU  Anesthesia Type:General  Level of Consciousness: awake, alert , oriented, and patient cooperative  Airway & Oxygen Therapy: Patient Spontanous Breathing and Patient connected to face mask oxygen  Post-op Assessment: Report given to RN and Post -op Vital signs reviewed and stable  Post vital signs: Reviewed and stable  Last Vitals:  Vitals Value Taken Time  BP 122/86 01/02/24 1400  Temp 36.5 C 01/02/24 1353  Pulse 85 01/02/24 1400  Resp 18 01/02/24 1400  SpO2 97 % 01/02/24 1400  Vitals shown include unfiled device data.  Last Pain:  Vitals:   01/02/24 1353  TempSrc:   PainSc: 0-No pain      Patients Stated Pain Goal: 0 (12/31/23 2000)  Complications: No notable events documented.

## 2024-01-02 NOTE — Progress Notes (Signed)
 Tried to call back to give report for pt to go to surgery. On hold for 10 minutes.   Pt A&0X4, room air, takes pills whole, 22g LFA, ACHS , Independent. Hasn't signed consent form yet. Would like to talk to surgeon before signing consent.

## 2024-01-02 NOTE — Op Note (Signed)
 Dustin Baker male 53 y.o. 01/02/2024  PreOperative Diagnosis: Right fifth metatarsal osteomyelitis Right foot forefoot abscess, multiple bursal spaces  PostOperative Diagnosis: Same  PROCEDURE: Right fifth partial ray resection Incision and drainage of forefoot abscess, multiple bursal spaces  SURGEON: Lasandra Points, MD  ASSISTANT: Jesse [place] was necessary for patient positioning, prep assistance with amputation and dressing placement  ANESTHESIA: General LMA  FINDINGS: Erosion of the mid to distal aspect of the fifth metatarsal with abscess formation within multiple bursal space including flexor tendon sheaths  IMPLANTS: None  INDICATIONS:53 y.o. male had a chronic wound about the lateral aspect of his fifth ray and right foot.  MRI scan demonstrated erosion of the fifth metatarsal and fracturing and there was concern for surrounding abscess formation.  He was indicated for surgery.   Patient understood the risks, benefits and alternatives to surgery which include but are not limited to wound healing complications, infection, nonunion, malunion, need for further surgery as well as damage to surrounding structures. They also understood the potential for continued pain in that there were no guarantees of acceptable outcome After weighing these risks the patient opted to proceed with surgery.  PROCEDURE: Patient was identified in the preoperative holding area.  The right foot was marked by myself.  Consent was signed by myself and the patient.  Block was performed by anesthesia in the preoperative holding area.  Patient was taken to the operative suite and placed supine on the operative table.  General LMA anesthesia anesthesia was induced without difficulty. Bump was placed under the operative hip and bone foam was used.  All bony prominences were well padded.     Lateral ray resection: We began by marking out the incision on the lateral border of the foot.  This was  taken around the base of the fifth toe and ellipsed out a chronic wound with hypertrophic skin and drainage.  Then the incision was carried sharply down to bone.  We are able to remove the fifth toe circumferentially with the distal aspect of the metatarsal.  There was erosive changes and fracturing through this area.  Then once the toe and the distal aspect of the metatarsal was removed the infected bone was sampled and material was sent for culture.  Then we further dissected out the soft tissue from the proximal aspect of the metatarsal to just distal to the fifth TMT joint.  There is sagittal saw was used to resect the remaining aspect of the fifth metatarsal sparing the peroneus brevis insertion and the fifth TMT joint.  This was done in an oblique fashion.  Then a rasp was used to smooth out the bone and any bony prominences.  Then further excision of infected and devitalized tissue was performed.  Incision and drainage abscess multiple bursal spaces: Then using a dissection scissor and hemostat the abscess cavities were deloculated within multiple bursal spaces.  There was purulent material that was expressed.  The flexor tendon sheaths were opened and decompressed.  Then normal saline was used to irrigate the wound bed and abscess cavities.  After irrigation the wound edges were inspected.  We removed overhanging skin and the wound was then closed using a 2-0 nylon suture.  Were able to close the wound in a tension-free fashion.  There was good bleeding skin edges.  Soft dressing was placed.  He was awakened from anesthesia and taken recovery in stable condition.  No complications.  No tourniquet was used.  POST OPERATIVE INSTRUCTIONS: Heel weightbearing  in a postoperative boot Follow-up in 2 weeks for wound check Continue antibiotics per medical team. Follow-up on cultures  TOURNIQUET TIME: No tourniquet  BLOOD LOSS:  less than 50 mL         DRAINS: none         SPECIMEN: none        COMPLICATIONS:  * No complications entered in OR log *         Disposition: PACU - hemodynamically stable.         Condition: stable

## 2024-01-02 NOTE — Anesthesia Preprocedure Evaluation (Addendum)
 Anesthesia Evaluation  Patient identified by MRN, date of birth, ID band Patient awake    Reviewed: Allergy & Precautions, H&P , NPO status , Patient's Chart, lab work & pertinent test results  Airway Mallampati: II   Neck ROM: full    Dental   Pulmonary neg pulmonary ROS   breath sounds clear to auscultation       Cardiovascular hypertension,  Rhythm:regular Rate:Normal     Neuro/Psych  PSYCHIATRIC DISORDERS  Depression       GI/Hepatic ,GERD  ,,  Endo/Other  diabetes, Type 2    Renal/GU      Musculoskeletal   Abdominal   Peds  Hematology   Anesthesia Other Findings   Reproductive/Obstetrics                             Anesthesia Physical Anesthesia Plan  ASA: 3  Anesthesia Plan: General   Post-op Pain Management:    Induction: Intravenous  PONV Risk Score and Plan: 2 and Midazolam , Treatment may vary due to age or medical condition, Ondansetron  and Dexamethasone   Airway Management Planned: Simple Face Mask and LMA  Additional Equipment:   Intra-op Plan:   Post-operative Plan: Extubation in OR  Informed Consent: I have reviewed the patients History and Physical, chart, labs and discussed the procedure including the risks, benefits and alternatives for the proposed anesthesia with the patient or authorized representative who has indicated his/her understanding and acceptance.     Dental advisory given  Plan Discussed with: CRNA, Anesthesiologist and Surgeon  Anesthesia Plan Comments:        Anesthesia Quick Evaluation

## 2024-01-03 ENCOUNTER — Other Ambulatory Visit (HOSPITAL_COMMUNITY): Payer: Self-pay

## 2024-01-03 ENCOUNTER — Encounter (HOSPITAL_COMMUNITY): Payer: Self-pay | Admitting: Orthopaedic Surgery

## 2024-01-03 DIAGNOSIS — E1169 Type 2 diabetes mellitus with other specified complication: Secondary | ICD-10-CM | POA: Diagnosis not present

## 2024-01-03 DIAGNOSIS — E1140 Type 2 diabetes mellitus with diabetic neuropathy, unspecified: Secondary | ICD-10-CM | POA: Diagnosis not present

## 2024-01-03 DIAGNOSIS — M86271 Subacute osteomyelitis, right ankle and foot: Secondary | ICD-10-CM | POA: Diagnosis not present

## 2024-01-03 LAB — CBC
HCT: 36.7 % — ABNORMAL LOW (ref 39.0–52.0)
Hemoglobin: 12.2 g/dL — ABNORMAL LOW (ref 13.0–17.0)
MCH: 28.3 pg (ref 26.0–34.0)
MCHC: 33.2 g/dL (ref 30.0–36.0)
MCV: 85.2 fL (ref 80.0–100.0)
Platelets: 384 10*3/uL (ref 150–400)
RBC: 4.31 MIL/uL (ref 4.22–5.81)
RDW: 12.2 % (ref 11.5–15.5)
WBC: 8.4 10*3/uL (ref 4.0–10.5)
nRBC: 0 % (ref 0.0–0.2)

## 2024-01-03 LAB — GLUCOSE, CAPILLARY
Glucose-Capillary: 253 mg/dL — ABNORMAL HIGH (ref 70–99)
Glucose-Capillary: 283 mg/dL — ABNORMAL HIGH (ref 70–99)
Glucose-Capillary: 248 mg/dL — ABNORMAL HIGH (ref 70–99)

## 2024-01-03 LAB — BASIC METABOLIC PANEL WITH GFR
Anion gap: 11 (ref 5–15)
BUN: 10 mg/dL (ref 6–20)
CO2: 24 mmol/L (ref 22–32)
Calcium: 9.1 mg/dL (ref 8.9–10.3)
Chloride: 99 mmol/L (ref 98–111)
Creatinine, Ser: 1.09 mg/dL (ref 0.61–1.24)
GFR, Estimated: >60 mL/min (ref >60)
Glucose, Bld: 284 mg/dL — ABNORMAL HIGH (ref 70–99)
Potassium: 4.3 mmol/L (ref 3.5–5.1)
Sodium: 134 mmol/L — ABNORMAL LOW (ref 135–145)

## 2024-01-03 MED ORDER — ATORVASTATIN CALCIUM 40 MG PO TABS
40.0000 mg | ORAL_TABLET | Freq: Every day | ORAL | 0 refills | Status: AC
  Filled 2024-01-03: qty 30, 30d supply, fill #0

## 2024-01-03 MED ORDER — LINEZOLID 600 MG PO TABS
600.0000 mg | ORAL_TABLET | Freq: Two times a day (BID) | ORAL | 0 refills | Status: AC
  Filled 2024-01-03: qty 3, 2d supply, fill #0

## 2024-01-03 NOTE — Progress Notes (Signed)
 Transition of Care [facility]) - Inpatient Brief Assessment   Patient Details  Name: Dustin Baker MRN: [id number] Date of Birth: 1971-02-21  [facility] of [facility] CM/SW Contact:    Dane Dung, RN Phone Number: 01/03/2024, 1:19 PM   Clinical Narrative: Cm met with the patient prior to discharge home.  Patient states that his family will provide transportation to home and appointments.  Patient has scheduled hospital follow up with Dr. Verneita Goldmann office already in place since the office called him this morning.  Patient has CAM walker in the hospital room.  No other TOC needs at this time and patient plans to discharge home with family today.   Transition of Care Asessment: Insurance and Status: (P) Insurance coverage has been reviewed Patient has primary care physician: (P) No Home environment has been reviewed: (P) from home Prior level of function:: (P) Independent Prior/Current Home Services: (P) No current home services Social Drivers of Health Review: (P) SDOH reviewed interventions complete Readmission risk has been reviewed: (P) Yes Transition of care needs: (P) no transition of care needs at this time

## 2024-01-03 NOTE — Discharge Summary (Signed)
 Name: Dustin Baker MRN: [id number] DOB: [birth year] 53 y.o. PCP: Default, Provider, MD  Date of Admission: 12/29/2023  3:28 PM Date of Discharge:  01/03/24 Attending Physician: Dr.  Alwin Baars  DISCHARGE DIAGNOSIS:  Primary Problem: Osteomyelitis of right foot [facility])   Hospital Problems: Principal Problem:   Osteomyelitis of right foot (HCC)    DISCHARGE MEDICATIONS:   Allergies as of 01/03/2024       Reactions   Vancomycin  Itching   Redness and itching after infusion - infuse as slower rate    Penicillins Rash   Childhood reaction        Medication List     STOP taking these medications    ciprofloxacin 500 MG tablet Commonly known as: CIPRO   clindamycin  300 MG capsule Commonly known as: CLEOCIN    doxycycline 100 MG capsule Commonly known as: MONODOX   sulfamethoxazole -trimethoprim  800-160 MG tablet Commonly known as: BACTRIM  DS       TAKE these medications    amLODipine  5 MG tablet Commonly known as: NORVASC  Take 1 tablet by mouth daily.   atorvastatin  40 MG tablet Commonly known as: LIPITOR Take 1 tablet (40 mg total) by mouth daily. Start taking on: Jan 04, 2024   Lantus  SoloStar 100 UNIT/ML Solostar Pen Generic drug: insulin  glargine Inject 20 Units into the skin at bedtime.   Basaglar  KwikPen 100 UNIT/ML Inject 10 units under the skin once daily. Increase as instructed to a max of 80 units per day.   dorzolamide-timolol 2-0.5 % ophthalmic solution Commonly known as: COSOPT Place 1 drop into the left eye daily.   linezolid  600 MG tablet Commonly known as: ZYVOX  Take 1 tablet (600 mg total) by mouth every 12 (twelve) hours.   metFORMIN  1000 MG tablet Commonly known as: GLUCOPHAGE  Take 1 tablet (1,000 mg total) by mouth 2 (two) times daily with a meal. Start with 1/2 tablet twice a day for 1-2 weeks, then increase to 1 tablet twice a day. What changed:  how much to take when to take this additional instructions   ofloxacin 0.3 %  ophthalmic solution Commonly known as: OCUFLOX   prednisoLONE acetate 1 % ophthalmic suspension Commonly known as: PRED FORTE Place 1 drop into the left eye daily.   tamsulosin 0.4 MG Caps capsule Commonly known as: FLOMAX Take by mouth daily.               Discharge Care Instructions  (From admission, onward)           Start     Ordered   01/03/24 0000  Discharge wound care:       Comments: Cleanse R foot wound with Vashe wound cleanser Timm Foot [phone]), do not rinse and allow to air dry. Apply a small piece of Vashe moistened gauze to wound bed daily, cover with dry gauze and secure with Kerlix roll gauze.   01/03/24 1256            DISPOSITION AND FOLLOW-UP:  Dustin Baker was discharged from [facility] in Stable condition. At the hospital follow up visit please address:  Follow-up Recommendations: Consults: Ortho  Labs:  None Studies: None Medications: Lipitor, linezolid   HOSPITAL COURSE:  Patient Summary: #Diabetic Foot Ulcer of Right Foot #Concern for Osteomyelitis #Possible cellulitis Initially treated in the outpatient setting.  Patient noted improvement during this time with no associated fever, chills, night sweats, or other signs or symptoms.  MRI during his visit prior to presentation demonstrated a  soft tissue ulceration of the lateral forefoot with heterogenous T2 hyperintense signal with peripheral enhancement measuring approximately 2.7 x 1.7 x 1.3 cm, which could represent packing material within a sinus tract; however, underlying abscess could not be excluded. There was marrow signal abnormality with associated enhancement of the fifth metatarsal and the fifth proximal phalanx, compatible with osteomyelitis.  There was also some subcutaneous edema and enhancement concerning for cellulitis.  No leukocytosis on his CBC (white blood cell count equal 6.8).  He was started on linezolid  rather than vancomycin  given his vancomycin   allergy. Broadened coverage with cefepime  for Pseudomonas coverage and Flagyl  for anaerobic coverage. Cefepime  narrowed to ceftriaxone . Will continue linezolid  for 2 days outpatient.   #T2DM #Diabetic Neuropathy Poorly controlled. Last A1c in 11/2023 elevated at 12.2. Current medications include Lantus  20 units daily and metformin  500 mg/day. Would consider resuming gabapentin  given history of neuropathy.  Continued on home Lantus  and sliding scale insulin .  Will resume metformin  outpatient.   #HTN Continued on home amlodipine  5 mg/day. Would recommend starting ACE inhibitor and SGLT2 inhibitor for improved renal protection in the outpatient setting.   #HLD  Resume atorvastatin  40 mg daily.  Follow-up outpatient   DISCHARGE INSTRUCTIONS:   Discharge Instructions     Call MD for:  difficulty breathing, headache or visual disturbances   Complete by: As directed    Call MD for:  extreme fatigue   Complete by: As directed    Call MD for:  hives   Complete by: As directed    Call MD for:  persistant dizziness or light-headedness   Complete by: As directed    Call MD for:  persistant nausea and vomiting   Complete by: As directed    Call MD for:  redness, tenderness, or signs of infection (pain, swelling, redness, odor or green/yellow discharge around incision site)   Complete by: As directed    Call MD for:  severe uncontrolled pain   Complete by: As directed    Call MD for:  temperature >100.4   Complete by: As directed    Diet - low sodium heart healthy   Complete by: As directed    Discharge instructions   Complete by: As directed    Dustin Baker,  You were hospitalized for an infection of the foot. This was treated with antibiotics and an amputation of the toe. At this time, you are safe to discharge home.  Please *CONTINUE* taking the following medications as prescribed:  - Linezolid  600 mg BID for three doses (one dose today and two doses tomorrow)  - Lipitor 40 mg daily    Please continue taking your other medications as prescribed. You will follow up with ortho in a couple weeks. Please also make sure to follow up with your PCP in the coming weeks for a hospital follow-up. We are glad that you are feeling better!   Discharge wound care:   Complete by: As directed    Cleanse R foot wound with Vashe wound cleanser Timm Foot [phone]), do not rinse and allow to air dry. Apply a small piece of Vashe moistened gauze to wound bed daily, cover with dry gauze and secure with Kerlix roll gauze.   Increase activity slowly   Complete by: As directed        SUBJECTIVE:  Patient was evaluated at bedside. Denies any nausea, vomiting, fevers, chills, night sweats, or other signs/symptoms.  Discharge Vitals:   BP 127/86 (BP Location: Left Arm)   Pulse 90  Temp 98.7 F (37.1 C) (Oral)   Resp 20   Ht 5\' 9"  (1.753 m)   Wt 109.5 kg   SpO2 99%   BMI 35.65 kg/m   OBJECTIVE:  Physical Exam Constitutional:      Appearance: Normal appearance.  HENT:     Head: Normocephalic and atraumatic.  Cardiovascular:     Rate and Rhythm: Normal rate and regular rhythm.  Pulmonary:     Effort: Pulmonary effort is normal.     Breath sounds: Normal breath sounds.  Abdominal:     General: Abdomen is flat. Bowel sounds are normal.     Palpations: Abdomen is soft.  Neurological:     Mental Status: He is alert.     Pertinent Labs, Studies, and Procedures:     Latest Ref Rng & Units 01/03/2024    8:20 AM 01/01/2024    6:15 AM 12/31/2023    4:31 AM  CBC  WBC 4.0 - 10.5 K/uL 8.4  5.8  5.8   Hemoglobin 13.0 - 17.0 g/dL 40.1  02.7  25.3   Hematocrit 39.0 - 52.0 % 36.7  34.8  33.9   Platelets 150 - 400 K/uL 384  373  364        Latest Ref Rng & Units 01/03/2024    8:20 AM 12/31/2023    4:31 AM 12/30/2023    2:08 AM  CMP  Glucose 70 - 99 mg/dL 664  403  474   BUN 6 - 20 mg/dL 10  9  12    Creatinine 0.61 - 1.24 mg/dL 2.59  5.63  8.75   Sodium 135 - 145 mmol/L 134  137  136    Potassium 3.5 - 5.1 mmol/L 4.3  4.3  3.8   Chloride 98 - 111 mmol/L 99  102  100   CO2 22 - 32 mmol/L 24  27  25    Calcium  8.9 - 10.3 mg/dL 9.1  8.8  8.7     Signed: Maxie Spaniel, MD Internal Medicine Resident, PGY-1 [facility]  Pager: [phone]

## 2024-01-03 NOTE — Evaluation (Signed)
 Physical Therapy Evaluation & Discharge Patient Details Name: Dustin Baker MRN: [id number] DOB: 1971-01-20 Today's Date: 01/03/2024  History of Present Illness  53 y.o. male admitted 12/29/23 with R foot wound. Workup for R 5th metatarsal osteomyelitis, R forefoot abscess. S/p R partial 5th ray resection, forefoot I&D on 5/1. PMH includes DM2, HTN, neuropathy.  Clinical Impression  Patient evaluated by Physical Therapy with no further acute PT needs identified. PTA, pt indep with mobility and self-care tasks, lives with son. Today, pt indep with mobility, demonstrates good ability to keep R heel weight bearing precautions with activity. All education has been completed and the patient has no further questions. Acute PT is signing off. Thank you for this referral.      If plan is discharge home, recommend the following: Assist for transportation   Can travel by private vehicle    Yes    Equipment Recommendations None recommended by PT  Recommendations for Other Services   N/A   Functional Status Assessment Patient has not had a recent decline in their functional status     Precautions / Restrictions Precautions Precautions: None Restrictions Weight Bearing Restrictions Per Provider Order: Yes RLE Weight Bearing Per Provider Order: Partial weight bearing Other Position/Activity Restrictions: heel weight bearing      Mobility  Bed Mobility Overal bed mobility: Independent                  Transfers Overall transfer level: Independent Equipment used: None                    Ambulation/Gait Ambulation/Gait assistance: Independent Gait Distance (Feet): 40 Feet Assistive device: None Gait Pattern/deviations: Step-through pattern, Decreased stride length Gait velocity: Decreased     General Gait Details: pt indep ambulating without DME, good ability to walk on R heel in order to prevent forefoot weight bearing  Stairs Stairs:  (educ on sequencing for  ascending/descending stairs with R foot precautions; pt declines formal stair training)          Wheelchair Mobility     Tilt Bed    Modified Rankin (Stroke Patients Only)       Balance Overall balance assessment: No apparent balance deficits (not formally assessed) Sitting-balance support: No upper extremity supported Sitting balance-Leahy Scale: Normal     Standing balance support: No upper extremity supported, During functional activity Standing balance-Leahy Scale: Good                               Pertinent Vitals/Pain Pain Assessment Pain Assessment: No/denies pain    Home Living Family/patient expects to be discharged to:: Private residence Living Arrangements: Children Available Help at Discharge: Family;Available PRN/intermittently Type of Home: House Home Access: Stairs to enter   Entrance Stairs-Number of Steps: 1   Home Layout: One level Home Equipment: None Additional Comments: lives 8 y.o. son who works    Prior Function Prior Level of Function : Independent/Modified Independent             Mobility Comments: indep without DME ADLs Comments: indep ADL/iADLs. has house cleaner 2x/mo     Extremity/Trunk Assessment   Upper Extremity Assessment Upper Extremity Assessment: Overall WFL for tasks assessed    Lower Extremity Assessment Lower Extremity Assessment: RLE deficits/detail RLE Deficits / Details: h/o bilateral foot neuropathy; s/p R 5th ray resection and forefoot I&D wrapped in bandage; gross hip and knee strength >/ 4/5  Communication   Communication Communication: No apparent difficulties    Cognition Arousal: Alert Behavior During Therapy: WFL for tasks assessed/performed   PT - Cognitive impairments: No apparent impairments                                 Cueing       General Comments General comments (skin integrity, edema, etc.): educ re: role of acute PT, POC, precautions, edema  control, DVT prevention, activity recommendations, therex/HEP, importance of daily skin checks, d/c needs    Exercises Other Exercises Other Exercises: Med[employer] HEP handout provided for generalized BLE strengthening while maintaining R foot weight bearing precautions   Assessment/Plan    PT Assessment Patient does not need any further PT services  PT Problem List         PT Treatment Interventions      PT Goals (Current goals can be found in the Care Plan section)  Acute Rehab PT Goals PT Goal Formulation: All assessment and education complete, DC therapy    Frequency       Co-evaluation               AM-PAC PT "6 Clicks" Mobility  Outcome Measure Help needed turning from your back to your side while in a flat bed without using bedrails?: None Help needed moving from lying on your back to sitting on the side of a flat bed without using bedrails?: None Help needed moving to and from a bed to a chair (including a wheelchair)?: None Help needed standing up from a chair using your arms (e.g., wheelchair or bedside chair)?: None Help needed to walk in hospital room?: None Help needed climbing 3-5 steps with a railing? : None 6 Click Score: 24    End of Session   Activity Tolerance: Patient tolerated treatment well Patient left: in bed;with call bell/phone within reach;with nursing/sitter in room Nurse Communication: Mobility status PT Visit Diagnosis: Other abnormalities of gait and mobility (R26.89)    Time: 1610-9604 PT Time Calculation (min) (ACUTE ONLY): 23 min   Charges:   PT Evaluation $PT Eval Low Complexity: 1 Low PT Treatments $Self Care/Home Management: 8-22 PT General Charges $$ ACUTE PT VISIT: 1 Visit        Blase Bur, PT, DPT Acute Rehabilitation Services  Personal: Secure Chat Rehab Office: [phone]  Albino Hum 01/03/2024, 11:22 AM

## 2024-01-03 NOTE — Anesthesia Postprocedure Evaluation (Signed)
 Anesthesia Post Note  Patient: Dustin Baker  Procedure(s) Performed: AMPUTATION, FOOT, PARTIAL (Right: Foot)     Patient location during evaluation: PACU Anesthesia Type: General Level of consciousness: awake and alert Pain management: pain level controlled Vital Signs Assessment: post-procedure vital signs reviewed and stable Respiratory status: spontaneous breathing, nonlabored ventilation, respiratory function stable and patient connected to nasal cannula oxygen Cardiovascular status: blood pressure returned to baseline and stable Postop Assessment: no apparent nausea or vomiting Anesthetic complications: no   No notable events documented.  Last Vitals:  Vitals:   01/03/24 0430 01/03/24 0740  BP: 122/89 127/86  Pulse: 93 90  Resp: 20 20  Temp: 36.9 C 37.1 C  SpO2: 98% 99%    Last Pain:  Vitals:   01/03/24 0900  TempSrc:   PainSc: 0-No pain                 [name] S

## 2024-01-03 NOTE — Plan of Care (Signed)

## 2024-01-03 NOTE — Progress Notes (Signed)
 Orthopedic Tech Progress Note Patient Details:  Dustin Baker 19-Feb-1971 [id number]  Ortho Devices Type of Ortho Device: CAM walker Ortho Device/Splint Location: RLE Ortho Device/Splint Interventions: Ordered, Application, Adjustment   Post Interventions Patient Tolerated: Well Instructions Provided: Adjustment of device, Care of device  [name] 01/03/2024, 9:58 AM

## 2024-01-03 NOTE — Inpatient Diabetes Management (Signed)
 Inpatient Diabetes Program Recommendations  AACE/ADA: New Consensus Statement on Inpatient Glycemic Control (2015)  Target Ranges:  Prepandial:   less than 140 mg/dL      Peak postprandial:   less than 180 mg/dL (1-2 hours)      Critically ill patients:  140 - 180 mg/dL   Lab Results  Component Value Date   GLUCAP 253 (H) 01/03/2024   HGBA1C 12.1 (A) 09/18/2022    Review of Glycemic Control  Latest Reference Range & Units 01/02/24 07:42 01/02/24 11:04 01/02/24 12:56 01/02/24 13:55 01/02/24 16:52 01/02/24 20:06 01/03/24 07:42  Glucose-Capillary 70 - 99 mg/dL 244 (H) 010 (H) 272 (H) 123 (H) 214 (H) 270 (H) 253 (H)   Diabetes history: DM 2 Outpatient Diabetes medications: metformin  2000 mg bid, lantus  20 units Current orders for Inpatient glycemic control:  Novolog  0-15 units tid + hs Semglee  20 units qhs  Note: Glucose elevations due to Decadron  5 mg administered yesterday  Inpatient Diabetes Program Recommendations:    -   May consider Novolog  3 units tid meal coverage if eating >50% of meals  Thanks,  Eloise Hake RN, MSN, BC-ADM Inpatient Diabetes Coordinator Team Pager [phone] (8a-5p)

## 2024-01-03 NOTE — Progress Notes (Signed)
     Belinda Bowler III is a 53 y.o. male   Orthopaedic diagnosis: Status post right partial fifth ray resection and I&D fore foot abscess 01/02/2024  Subjective: Patient resting comfortably.  He reports no pain.  He notes extensive baseline sensory deficits due to known neuropathy.  We had ordered a Cam boot postoperatively but he has not received it yet.  Intra-Op specimen revealing gram-positive cocci in pairs.  No culture results yet for Organism identification.  Remains on IV antibiotics per hospitalist..  Objectyive: Vitals:   01/03/24 0430 01/03/24 0740  BP: 122/89 127/86  Pulse: 93 90  Resp: 20 20  Temp: 98.4 F (36.9 C) 98.7 F (37.1 C)  SpO2: 98% 99%     Exam: Awake and alert Respirations even and unlabored No acute distress  Right foot with dressing in place status post fifth ray resection.  Exposed skin and toes distally are warm and well perfused.  Diminished sensation noted secondary to baseline sensory deficits. Calf is soft and Nontender.  Assessment: Postop day 1 status post the above, doing well  Plan: Patient doing well.  He has no pain.  We will D/C the oxycodone .  Do not expect he will need any pain medicine at discharge due to his baseline sensory deficits.  We will reorder a short Cam walker boot.  He is suitable for heel weightbearing as tolerated. Keep dressing in place. Antibiotic per primary team.  Follow cultures. Okay for her therapy efforts.  Plan for outpatient visit with Dr. Hulda Mage 2 weeks from surgery for repeat evaluation of the wound. Plan to leave sutures in place for 2-4 weeks based on degree of wound healing.   Jenessa Gillingham J. [place], PA-C

## 2024-01-05 LAB — AEROBIC/ANAEROBIC CULTURE W GRAM STAIN (SURGICAL/DEEP WOUND): Culture: NO GROWTH — AB

## 2024-01-06 ENCOUNTER — Telehealth: Payer: Self-pay

## 2024-01-06 NOTE — Transitions of Care (Post Inpatient/ED Visit) (Signed)
   01/06/2024  Name: Dustin Baker MRN: [id number] DOB: November 15, 1970  Today's TOC FU Call Status: Today's TOC FU Call Status:: Unsuccessful Call (1st Attempt) Unsuccessful Call (1st Attempt) Date: 01/06/24  Attempted to reach the patient regarding the most recent Inpatient/ED visit.  Follow Up Plan: Additional outreach attempts will be made to reach the patient to complete the Transitions of Care (Post Inpatient/ED visit) call.   Signature Darrall Ellison, LPN [facility] Nurse Health Advisor Direct Dial [phone]

## 2024-01-06 NOTE — Transitions of Care (Post Inpatient/ED Visit) (Signed)
 01/06/2024  Name: Dustin Baker MRN: [id number] DOB: 07/19/1971  Today's TOC FU Call Status: Today's TOC FU Call Status:: Successful TOC FU Call Completed Unsuccessful Call (1st Attempt) Date: 01/06/24 [facility] FU Call Complete Date: 01/06/24 Patient's Name and Date of Birth confirmed.  Transition Care Management Follow-up Telephone Call Date of Discharge: 01/03/24 Discharge Facility: [facility]) Type of Discharge: Inpatient Admission Primary Inpatient Discharge Diagnosis:: osteomelitis How have you been since you were released from the hospital?: Same Any questions or concerns?: No  Items Reviewed: Did you receive and understand the discharge instructions provided?: Yes Medications obtained,verified, and reconciled?: Yes (Medications Reviewed) Any new allergies since your discharge?: No Dietary orders reviewed?: Yes Do you have support at home?: No  Medications Reviewed Today: Medications Reviewed Today     Reviewed by Darrall Ellison, LPN (Licensed Practical Nurse) on 01/06/24 at 1112  Med List Status: <None>   Medication Order Taking? Sig Documenting Provider Last Dose Status Informant  amLODipine  (NORVASC ) 5 MG tablet [id number] No Take 1 tablet by mouth daily. [provider] 12/29/2023 Active Self, Pharmacy Records  atorvastatin  (LIPITOR) 40 MG tablet [id number]  Take 1 tablet (40 mg total) by mouth daily. Maxie Spaniel, MD  Active   dorzolamide-timolol (COSOPT) 2-0.5 % ophthalmic solution [id number] No Place 1 drop into the left eye daily.  Patient not taking: Reported on 12/30/2023   [provider] Not Taking Active Self, Pharmacy Records  Insulin  Glargine (BASAGLAR  KWIKPEN) 100 UNIT/ML [id number] No Inject 10 units under the skin once daily. Increase as instructed to a max of 80 units per day.  Patient not taking: Reported on 12/30/2023   [provider] Not Taking Active Self, Pharmacy Records  insulin  glargine (LANTUS  SOLOSTAR) 100 UNIT/ML  Solostar Pen [id number] No Inject 20 Units into the skin at bedtime. [provider] 12/28/2023 Active Self, Pharmacy Records  linezolid  (ZYVOX ) 600 MG tablet [id number]  Take 1 tablet (600 mg total) by mouth every 12 (twelve) hours. Maxie Spaniel, MD  Active   metFORMIN  (GLUCOPHAGE ) 1000 MG tablet [id number] No Take 1 tablet (1,000 mg total) by mouth 2 (two) times daily with a meal. Start with 1/2 tablet twice a day for 1-2 weeks, then increase to 1 tablet twice a day.  Patient taking differently: Take 2,000 mg by mouth in the morning and at bedtime.   Aida House, MD 12/29/2023 Active Self, Pharmacy Records  ofloxacin (OCUFLOX) 0.3 % ophthalmic solution [id number] No   Patient not taking: Reported on 12/30/2023   [provider] Not Taking Active Self, Pharmacy Records  prednisoLONE acetate (PRED FORTE) 1 % ophthalmic suspension [id number] No Place 1 drop into the left eye daily. [provider] 12/29/2023 Active Self, Pharmacy Records  tamsulosin (FLOMAX) 0.4 MG CAPS capsule [id number] No Take by mouth daily.  Patient not taking: Reported on 12/30/2023   [provider] Not Taking Active Self, Pharmacy Records            Home Care and Equipment/Supplies: Were Home Health Services Ordered?: NA Any new equipment or medical supplies ordered?: NA  Functional Questionnaire: Do you need assistance with bathing/showering or dressing?: No Do you need assistance with meal preparation?: No Do you need assistance with eating?: No Do you have difficulty maintaining continence: No Do you need assistance with getting out of bed/getting out of a chair/moving?: No Do you have difficulty managing or taking your medications?: No  Follow up appointments reviewed: PCP Follow-up appointment confirmed?: NA Specialist [facility]  Follow-up appointment confirmed?: Yes Date of Specialist follow-up appointment?: 01/24/24 Follow-Up Specialty Provider:: ortho Do you need  transportation to your follow-up appointment?: No Do you understand care options if your condition(s) worsen?: Yes-patient verbalized understanding Transferred to [facility]   SIGNATURE  Darrall Ellison, LPN [facility] Nurse Health Advisor Direct Dial [phone]
